# Patient Record
Sex: Male | Born: 1955 | Race: Black or African American | Hispanic: No | Marital: Single
Health system: Midwestern US, Community
[De-identification: ages and names within clinical notes are randomized; demographics above are authoritative.]

## PROBLEM LIST (undated history)

## (undated) DIAGNOSIS — F1020 Alcohol dependence, uncomplicated: Secondary | ICD-10-CM

## (undated) DIAGNOSIS — A4101 Sepsis due to Methicillin susceptible Staphylococcus aureus: Secondary | ICD-10-CM

## (undated) DIAGNOSIS — J189 Pneumonia, unspecified organism: Secondary | ICD-10-CM

## (undated) DIAGNOSIS — E441 Mild protein-calorie malnutrition: Secondary | ICD-10-CM

## (undated) DIAGNOSIS — Z992 Dependence on renal dialysis: Secondary | ICD-10-CM

## (undated) DIAGNOSIS — I471 Supraventricular tachycardia: Secondary | ICD-10-CM

## (undated) DIAGNOSIS — I10 Essential (primary) hypertension: Secondary | ICD-10-CM

## (undated) DIAGNOSIS — N186 End stage renal disease: Secondary | ICD-10-CM

## (undated) DIAGNOSIS — E119 Type 2 diabetes mellitus without complications: Secondary | ICD-10-CM

## (undated) DIAGNOSIS — Z8679 Personal history of other diseases of the circulatory system: Secondary | ICD-10-CM

## (undated) DIAGNOSIS — E876 Hypokalemia: Secondary | ICD-10-CM

## (undated) DIAGNOSIS — T80211A Bloodstream infection due to central venous catheter, initial encounter: Secondary | ICD-10-CM

## (undated) DIAGNOSIS — E1122 Type 2 diabetes mellitus with diabetic chronic kidney disease: Secondary | ICD-10-CM

## (undated) DIAGNOSIS — D649 Anemia, unspecified: Secondary | ICD-10-CM

## (undated) DIAGNOSIS — T82510A Breakdown (mechanical) of surgically created arteriovenous fistula, initial encounter: Secondary | ICD-10-CM

## (undated) DIAGNOSIS — N2581 Secondary hyperparathyroidism of renal origin: Secondary | ICD-10-CM

## (undated) DIAGNOSIS — M4658 Other infective spondylopathies, sacral and sacrococcygeal region: Secondary | ICD-10-CM

## (undated) HISTORY — DX: Secondary hyperparathyroidism of renal origin: N25.81

## (undated) HISTORY — DX: Hypocalcemia: E83.51

## (undated) HISTORY — DX: Hypokalemia: E87.6

## (undated) HISTORY — PX: COLONOSCOPY: SHX174

## (undated) HISTORY — PX: DIALYSIS FISTULA CREATION: SHX611

## (undated) HISTORY — DX: Mild protein-calorie malnutrition: E44.1

## (undated) HISTORY — PX: CHOLECYSTECTOMY: SHX55

---
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
## 2014-09-26 ENCOUNTER — Emergency Department (HOSPITAL_COMMUNITY): Payer: 59

## 2014-09-26 ENCOUNTER — Encounter (HOSPITAL_COMMUNITY): Payer: Self-pay | Admitting: Emergency Medicine

## 2014-09-26 ENCOUNTER — Emergency Department (HOSPITAL_COMMUNITY)
Admission: EM | Admit: 2014-09-26 | Discharge: 2014-09-26 | Disposition: A | Discharge status: Home / Self Care | Payer: 59 | Attending: Emergency Medicine | Admitting: Emergency Medicine

## 2014-09-26 DIAGNOSIS — R410 Disorientation, unspecified: Secondary | ICD-10-CM

## 2014-09-26 DIAGNOSIS — R531 Weakness: Secondary | ICD-10-CM | POA: Insufficient documentation

## 2014-09-26 DIAGNOSIS — I120 Hypertensive chronic kidney disease with stage 5 chronic kidney disease or end stage renal disease: Secondary | ICD-10-CM | POA: Diagnosis not present

## 2014-09-26 DIAGNOSIS — E119 Type 2 diabetes mellitus without complications: Secondary | ICD-10-CM | POA: Insufficient documentation

## 2014-09-26 DIAGNOSIS — Z79899 Other long term (current) drug therapy: Secondary | ICD-10-CM | POA: Diagnosis not present

## 2014-09-26 DIAGNOSIS — R0602 Shortness of breath: Secondary | ICD-10-CM | POA: Insufficient documentation

## 2014-09-26 DIAGNOSIS — F4489 Other dissociative and conversion disorders: Secondary | ICD-10-CM | POA: Insufficient documentation

## 2014-09-26 DIAGNOSIS — N186 End stage renal disease: Secondary | ICD-10-CM | POA: Diagnosis not present

## 2014-09-26 DIAGNOSIS — Z992 Dependence on renal dialysis: Secondary | ICD-10-CM | POA: Diagnosis not present

## 2014-09-26 DIAGNOSIS — R42 Dizziness and giddiness: Secondary | ICD-10-CM | POA: Diagnosis present

## 2014-09-26 DIAGNOSIS — Z794 Long term (current) use of insulin: Secondary | ICD-10-CM | POA: Insufficient documentation

## 2014-09-26 DIAGNOSIS — R5383 Other fatigue: Secondary | ICD-10-CM | POA: Diagnosis not present

## 2014-09-26 HISTORY — DX: Type 2 diabetes mellitus without complications: E11.9

## 2014-09-26 HISTORY — DX: Essential (primary) hypertension: I10

## 2014-09-26 HISTORY — DX: Dependence on renal dialysis: Z99.2

## 2014-09-26 LAB — CBC
HEMATOCRIT: 35.1 % — AB (ref 39.0–52.0)
Hemoglobin: 11.4 g/dL — ABNORMAL LOW (ref 13.0–17.0)
MCH: 27.8 pg (ref 26.0–34.0)
MCHC: 32.5 g/dL (ref 30.0–36.0)
MCV: 85.6 fL (ref 78.0–100.0)
Platelets: 161 10*3/uL (ref 150–400)
RBC: 4.1 MIL/uL — ABNORMAL LOW (ref 4.22–5.81)
RDW: 18.5 % — AB (ref 11.5–15.5)
WBC: 12 10*3/uL — ABNORMAL HIGH (ref 4.0–10.5)

## 2014-09-26 LAB — BASIC METABOLIC PANEL
Anion gap: 21 — ABNORMAL HIGH (ref 5–15)
BUN: 46 mg/dL — AB (ref 6–23)
CALCIUM: 10 mg/dL (ref 8.4–10.5)
CO2: 22 mEq/L (ref 19–32)
CREATININE: 8.28 mg/dL — AB (ref 0.50–1.35)
Chloride: 97 mEq/L (ref 96–112)
GFR, EST AFRICAN AMERICAN: 7 mL/min — AB (ref >90)
GFR, EST NON AFRICAN AMERICAN: 6 mL/min — AB (ref >90)
GLUCOSE: 138 mg/dL — AB (ref 70–99)
Potassium: 5.2 mEq/L (ref 3.7–5.3)
Sodium: 140 mEq/L (ref 137–147)

## 2014-09-26 LAB — CBG MONITORING, ED: GLUCOSE-CAPILLARY: 138 mg/dL — AB (ref 70–99)

## 2014-09-26 LAB — I-STAT TROPONIN, ED: Troponin i, poc: 0.08 ng/mL (ref 0.00–0.08)

## 2014-09-26 LAB — TROPONIN I: Troponin I: <0.3 ng/mL (ref <0.30)

## 2014-09-26 MED ORDER — CLONIDINE HCL 0.2 MG PO TABS
0.2000 mg | ORAL_TABLET | Freq: Once | ORAL | Status: AC
  Administered 2014-09-26: 0.2 mg via ORAL
  Filled 2014-09-26: qty 1

## 2014-09-26 MED ORDER — LISINOPRIL 20 MG PO TABS
40.0000 mg | ORAL_TABLET | Freq: Once | ORAL | Status: AC
  Administered 2014-09-26: 40 mg via ORAL
  Filled 2014-09-26: qty 2

## 2014-09-26 NOTE — ED Notes (Signed)
Patient returned from X-ray 

## 2014-09-26 NOTE — ED Notes (Signed)
MD Jeneen Rinks and Phlebotomy at the bedside.

## 2014-09-26 NOTE — Discharge Instructions (Signed)
Return to the emergency room with any additional symptoms today  Recheck with your regular physician.
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
## 2014-09-26 NOTE — ED Notes (Signed)
Per pt sts dizzy since yesterday. sts he had a blood transfusion yesterday and dialysis. sts he feels light headed. sts some right flank pain.

## 2014-09-28 ENCOUNTER — Inpatient Hospital Stay (HOSPITAL_COMMUNITY)
Admission: EM | Admit: 2014-09-28 | Discharge: 2014-10-06 | DRG: 252 | Disposition: A | Discharge status: Home Health | Payer: 59 | Attending: Internal Medicine | Admitting: Internal Medicine

## 2014-09-28 ENCOUNTER — Encounter (HOSPITAL_COMMUNITY): Payer: Self-pay | Admitting: Emergency Medicine

## 2014-09-28 ENCOUNTER — Emergency Department (HOSPITAL_COMMUNITY): Payer: 59

## 2014-09-28 DIAGNOSIS — Z113 Encounter for screening for infections with a predominantly sexual mode of transmission: Secondary | ICD-10-CM | POA: Insufficient documentation

## 2014-09-28 DIAGNOSIS — Z992 Dependence on renal dialysis: Secondary | ICD-10-CM | POA: Diagnosis not present

## 2014-09-28 DIAGNOSIS — N186 End stage renal disease: Secondary | ICD-10-CM | POA: Diagnosis present

## 2014-09-28 DIAGNOSIS — Z23 Encounter for immunization: Secondary | ICD-10-CM

## 2014-09-28 DIAGNOSIS — R000 Tachycardia, unspecified: Secondary | ICD-10-CM

## 2014-09-28 DIAGNOSIS — M009 Pyogenic arthritis, unspecified: Secondary | ICD-10-CM

## 2014-09-28 DIAGNOSIS — M4806 Spinal stenosis, lumbar region: Secondary | ICD-10-CM | POA: Diagnosis present

## 2014-09-28 DIAGNOSIS — I120 Hypertensive chronic kidney disease with stage 5 chronic kidney disease or end stage renal disease: Secondary | ICD-10-CM | POA: Diagnosis present

## 2014-09-28 DIAGNOSIS — I480 Paroxysmal atrial fibrillation: Secondary | ICD-10-CM | POA: Diagnosis present

## 2014-09-28 DIAGNOSIS — E1122 Type 2 diabetes mellitus with diabetic chronic kidney disease: Secondary | ICD-10-CM | POA: Diagnosis present

## 2014-09-28 DIAGNOSIS — I4891 Unspecified atrial fibrillation: Secondary | ICD-10-CM | POA: Diagnosis present

## 2014-09-28 DIAGNOSIS — M461 Sacroiliitis, not elsewhere classified: Secondary | ICD-10-CM | POA: Diagnosis present

## 2014-09-28 DIAGNOSIS — D631 Anemia in chronic kidney disease: Secondary | ICD-10-CM | POA: Diagnosis present

## 2014-09-28 DIAGNOSIS — M4658 Other infective spondylopathies, sacral and sacrococcygeal region: Secondary | ICD-10-CM | POA: Insufficient documentation

## 2014-09-28 DIAGNOSIS — B958 Unspecified staphylococcus as the cause of diseases classified elsewhere: Secondary | ICD-10-CM | POA: Diagnosis present

## 2014-09-28 DIAGNOSIS — R410 Disorientation, unspecified: Secondary | ICD-10-CM | POA: Diagnosis present

## 2014-09-28 DIAGNOSIS — M48061 Spinal stenosis, lumbar region without neurogenic claudication: Secondary | ICD-10-CM | POA: Insufficient documentation

## 2014-09-28 DIAGNOSIS — R0602 Shortness of breath: Secondary | ICD-10-CM | POA: Diagnosis present

## 2014-09-28 DIAGNOSIS — R29898 Other symptoms and signs involving the musculoskeletal system: Secondary | ICD-10-CM | POA: Insufficient documentation

## 2014-09-28 DIAGNOSIS — M160 Bilateral primary osteoarthritis of hip: Secondary | ICD-10-CM | POA: Diagnosis present

## 2014-09-28 DIAGNOSIS — E8770 Fluid overload, unspecified: Secondary | ICD-10-CM | POA: Diagnosis present

## 2014-09-28 DIAGNOSIS — I471 Supraventricular tachycardia, unspecified: Secondary | ICD-10-CM

## 2014-09-28 DIAGNOSIS — I10 Essential (primary) hypertension: Secondary | ICD-10-CM | POA: Diagnosis present

## 2014-09-28 DIAGNOSIS — Z8679 Personal history of other diseases of the circulatory system: Secondary | ICD-10-CM

## 2014-09-28 DIAGNOSIS — Z794 Long term (current) use of insulin: Secondary | ICD-10-CM

## 2014-09-28 DIAGNOSIS — A419 Sepsis, unspecified organism: Secondary | ICD-10-CM

## 2014-09-28 DIAGNOSIS — Y848 Other medical procedures as the cause of abnormal reaction of the patient, or of later complication, without mention of misadventure at the time of the procedure: Secondary | ICD-10-CM | POA: Diagnosis present

## 2014-09-28 DIAGNOSIS — A4101 Sepsis due to Methicillin susceptible Staphylococcus aureus: Secondary | ICD-10-CM | POA: Diagnosis present

## 2014-09-28 DIAGNOSIS — J90 Pleural effusion, not elsewhere classified: Secondary | ICD-10-CM

## 2014-09-28 DIAGNOSIS — M25559 Pain in unspecified hip: Secondary | ICD-10-CM

## 2014-09-28 DIAGNOSIS — T827XXA Infection and inflammatory reaction due to other cardiac and vascular devices, implants and grafts, initial encounter: Secondary | ICD-10-CM | POA: Diagnosis present

## 2014-09-28 DIAGNOSIS — T8579XA Infection and inflammatory reaction due to other internal prosthetic devices, implants and grafts, initial encounter: Secondary | ICD-10-CM

## 2014-09-28 DIAGNOSIS — E119 Type 2 diabetes mellitus without complications: Secondary | ICD-10-CM

## 2014-09-28 DIAGNOSIS — B999 Unspecified infectious disease: Secondary | ICD-10-CM

## 2014-09-28 DIAGNOSIS — N189 Chronic kidney disease, unspecified: Secondary | ICD-10-CM

## 2014-09-28 DIAGNOSIS — T82898A Other specified complication of vascular prosthetic devices, implants and grafts, initial encounter: Secondary | ICD-10-CM

## 2014-09-28 DIAGNOSIS — T80211A Bloodstream infection due to central venous catheter, initial encounter: Secondary | ICD-10-CM

## 2014-09-28 HISTORY — DX: Type 2 diabetes mellitus without complications: E11.9

## 2014-09-28 HISTORY — DX: Bloodstream infection due to central venous catheter, initial encounter: T80.211A

## 2014-09-28 HISTORY — DX: End stage renal disease: N18.6

## 2014-09-28 HISTORY — DX: Supraventricular tachycardia: I47.1

## 2014-09-28 HISTORY — DX: Supraventricular tachycardia, unspecified: I47.10

## 2014-09-28 LAB — COMPREHENSIVE METABOLIC PANEL
ALK PHOS: 129 U/L — AB (ref 39–117)
ALT: 24 U/L (ref 0–53)
ANION GAP: 19 — AB (ref 5–15)
AST: 31 U/L (ref 0–37)
Albumin: 3.2 g/dL — ABNORMAL LOW (ref 3.5–5.2)
BUN: 30 mg/dL — AB (ref 6–23)
CALCIUM: 10.1 mg/dL (ref 8.4–10.5)
CO2: 24 mEq/L (ref 19–32)
Chloride: 95 mEq/L — ABNORMAL LOW (ref 96–112)
Creatinine, Ser: 6.36 mg/dL — ABNORMAL HIGH (ref 0.50–1.35)
GFR, EST AFRICAN AMERICAN: 10 mL/min — AB (ref >90)
GFR, EST NON AFRICAN AMERICAN: 9 mL/min — AB (ref >90)
GLUCOSE: 121 mg/dL — AB (ref 70–99)
Potassium: 4.6 mEq/L (ref 3.7–5.3)
Sodium: 138 mEq/L (ref 137–147)
Total Bilirubin: 0.7 mg/dL (ref 0.3–1.2)
Total Protein: 8.3 g/dL (ref 6.0–8.3)

## 2014-09-28 LAB — CBC WITH DIFFERENTIAL/PLATELET
BASOS PCT: 0 % (ref 0–1)
Basophils Absolute: 0 10*3/uL (ref 0.0–0.1)
Eosinophils Absolute: 0 10*3/uL (ref 0.0–0.7)
Eosinophils Relative: 0 % (ref 0–5)
HEMATOCRIT: 34.3 % — AB (ref 39.0–52.0)
HEMOGLOBIN: 11.4 g/dL — AB (ref 13.0–17.0)
Lymphocytes Relative: 5 % — ABNORMAL LOW (ref 12–46)
Lymphs Abs: 0.8 10*3/uL (ref 0.7–4.0)
MCH: 27.7 pg (ref 26.0–34.0)
MCHC: 33.2 g/dL (ref 30.0–36.0)
MCV: 83.5 fL (ref 78.0–100.0)
MONO ABS: 1.5 10*3/uL — AB (ref 0.1–1.0)
Monocytes Relative: 9 % (ref 3–12)
NEUTROS ABS: 14.6 10*3/uL — AB (ref 1.7–7.7)
Neutrophils Relative %: 86 % — ABNORMAL HIGH (ref 43–77)
Platelets: 139 10*3/uL — ABNORMAL LOW (ref 150–400)
RBC: 4.11 MIL/uL — ABNORMAL LOW (ref 4.22–5.81)
RDW: 18.3 % — ABNORMAL HIGH (ref 11.5–15.5)
WBC: 16.9 10*3/uL — ABNORMAL HIGH (ref 4.0–10.5)

## 2014-09-28 LAB — I-STAT CG4 LACTIC ACID, ED: Lactic Acid, Venous: 1.85 mmol/L (ref 0.5–2.2)

## 2014-09-28 LAB — GLUCOSE, CAPILLARY: GLUCOSE-CAPILLARY: 121 mg/dL — AB (ref 70–99)

## 2014-09-28 MED ORDER — LISINOPRIL 40 MG PO TABS
40.0000 mg | ORAL_TABLET | Freq: Every day | ORAL | Status: DC
  Administered 2014-09-28: 40 mg via ORAL
  Filled 2014-09-28 (×2): qty 1

## 2014-09-28 MED ORDER — VITAMIN D (ERGOCALCIFEROL) 1.25 MG (50000 UNIT) PO CAPS
50000.0000 [IU] | ORAL_CAPSULE | ORAL | Status: DC
  Administered 2014-10-04: 50000 [IU] via ORAL
  Filled 2014-09-28: qty 1

## 2014-09-28 MED ORDER — INSULIN ASPART 100 UNIT/ML ~~LOC~~ SOLN
0.0000 [IU] | Freq: Three times a day (TID) | SUBCUTANEOUS | Status: DC
  Administered 2014-09-29: 3 [IU] via SUBCUTANEOUS
  Administered 2014-09-29 – 2014-09-30 (×3): 4 [IU] via SUBCUTANEOUS
  Administered 2014-09-30: 1 [IU] via SUBCUTANEOUS
  Administered 2014-10-01: 3 [IU] via SUBCUTANEOUS
  Administered 2014-10-01 – 2014-10-05 (×2): 4 [IU] via SUBCUTANEOUS

## 2014-09-28 MED ORDER — VANCOMYCIN HCL 10 G IV SOLR
2000.0000 mg | Freq: Once | INTRAVENOUS | Status: AC
  Administered 2014-09-28: 2000 mg via INTRAVENOUS
  Filled 2014-09-28: qty 2000

## 2014-09-28 MED ORDER — ADENOSINE 6 MG/2ML IV SOLN
INTRAVENOUS | Status: AC
  Filled 2014-09-28: qty 4

## 2014-09-28 MED ORDER — TORSEMIDE 20 MG PO TABS
40.0000 mg | ORAL_TABLET | Freq: Every day | ORAL | Status: DC
  Administered 2014-09-28 – 2014-10-01 (×4): 40 mg via ORAL
  Filled 2014-09-28 (×5): qty 2

## 2014-09-28 MED ORDER — INFLUENZA VAC SPLIT QUAD 0.5 ML IM SUSY
0.5000 mL | PREFILLED_SYRINGE | INTRAMUSCULAR | Status: DC
  Filled 2014-09-28: qty 0.5

## 2014-09-28 MED ORDER — METOPROLOL TARTRATE 1 MG/ML IV SOLN
5.0000 mg | Freq: Once | INTRAVENOUS | Status: AC
  Administered 2014-09-28: 5 mg via INTRAVENOUS
  Filled 2014-09-28: qty 5

## 2014-09-28 MED ORDER — HEPARIN SODIUM (PORCINE) 5000 UNIT/ML IJ SOLN
5000.0000 [IU] | Freq: Three times a day (TID) | INTRAMUSCULAR | Status: DC
  Administered 2014-09-28 – 2014-10-02 (×11): 5000 [IU] via SUBCUTANEOUS
  Filled 2014-09-28 (×14): qty 1

## 2014-09-28 MED ORDER — PIPERACILLIN-TAZOBACTAM IN DEX 2-0.25 GM/50ML IV SOLN
2.2500 g | Freq: Three times a day (TID) | INTRAVENOUS | Status: DC
Start: 2014-09-28 — End: 2014-09-30
  Administered 2014-09-28 – 2014-09-30 (×6): 2.25 g via INTRAVENOUS
  Filled 2014-09-28 (×8): qty 50

## 2014-09-28 MED ORDER — ACETAMINOPHEN 650 MG RE SUPP: 650.0000 mg | Freq: Four times a day (QID) | RECTAL | Status: DC | PRN

## 2014-09-28 MED ORDER — GABAPENTIN 100 MG PO CAPS
100.0000 mg | ORAL_CAPSULE | Freq: Three times a day (TID) | ORAL | Status: DC
  Administered 2014-09-28 – 2014-10-06 (×22): 100 mg via ORAL
  Filled 2014-09-28 (×25): qty 1

## 2014-09-28 MED ORDER — INSULIN GLARGINE 100 UNIT/ML ~~LOC~~ SOLN
13.0000 [IU] | Freq: Every day | SUBCUTANEOUS | Status: DC
  Administered 2014-09-30 – 2014-10-04 (×5): 13 [IU] via SUBCUTANEOUS
  Filled 2014-09-28 (×7): qty 0.13

## 2014-09-28 MED ORDER — DOXAZOSIN MESYLATE 4 MG PO TABS
4.0000 mg | ORAL_TABLET | Freq: Every day | ORAL | Status: DC
  Administered 2014-09-28 – 2014-10-01 (×4): 4 mg via ORAL
  Filled 2014-09-28 (×5): qty 1

## 2014-09-28 MED ORDER — LABETALOL HCL 300 MG PO TABS
300.0000 mg | ORAL_TABLET | Freq: Three times a day (TID) | ORAL | Status: DC
  Administered 2014-09-28 – 2014-09-29 (×2): 300 mg via ORAL
  Filled 2014-09-28 (×4): qty 1

## 2014-09-28 MED ORDER — SEVELAMER CARBONATE 800 MG PO TABS
1600.0000 mg | ORAL_TABLET | Freq: Three times a day (TID) | ORAL | Status: DC
  Administered 2014-09-29 – 2014-10-06 (×14): 1600 mg via ORAL
  Filled 2014-09-28 (×25): qty 2

## 2014-09-28 MED ORDER — CLONIDINE HCL 0.3 MG PO TABS
0.3000 mg | ORAL_TABLET | Freq: Three times a day (TID) | ORAL | Status: DC
  Administered 2014-09-28 – 2014-09-29 (×2): 0.3 mg via ORAL
  Filled 2014-09-28 (×5): qty 1

## 2014-09-28 MED ORDER — ACETAMINOPHEN 500 MG PO TABS
1000.0000 mg | ORAL_TABLET | Freq: Once | ORAL | Status: AC
  Administered 2014-09-28: 1000 mg via ORAL
  Filled 2014-09-28: qty 2

## 2014-09-28 MED ORDER — INSULIN ASPART 100 UNIT/ML ~~LOC~~ SOLN: 0.0000 [IU] | Freq: Every day | SUBCUTANEOUS | Status: DC

## 2014-09-28 MED ORDER — ADENOSINE 6 MG/2ML IV SOLN
INTRAVENOUS | Status: AC
  Filled 2014-09-28: qty 2

## 2014-09-28 MED ORDER — SODIUM CHLORIDE 0.9 % IJ SOLN
3.0000 mL | Freq: Two times a day (BID) | INTRAMUSCULAR | Status: DC
  Administered 2014-09-29 – 2014-10-06 (×13): 3 mL via INTRAVENOUS

## 2014-09-28 MED ORDER — ACETAMINOPHEN 325 MG PO TABS: 650.0000 mg | ORAL_TABLET | Freq: Four times a day (QID) | ORAL | Status: DC | PRN

## 2014-09-28 MED ORDER — FAMOTIDINE 20 MG PO TABS
20.0000 mg | ORAL_TABLET | Freq: Every day | ORAL | Status: DC
  Administered 2014-09-29 – 2014-10-06 (×8): 20 mg via ORAL
  Filled 2014-09-28 (×8): qty 1

## 2014-09-28 NOTE — ED Provider Notes (Signed)
TIME SEEN: 6:43 PM  CHIEF COMPLAINT: Fever, tachycardia, possible infection at dialysis catheter site  HPI: Pt is a 58 y.o. M with history of hypertension, diabetes, end-stage renal disease on hemodialysis Tuesday, Thursday and Saturday who recently missed analysis and had dialysis today who presents to emergency department with fever, tachypnea, tachycardia with possible infection with at his dialysis catheter site. Denies any recent headache, neck pain or neck stiffness, cough, vomiting or diarrhea. He has had diffuse body aches. No sick contacts or recent travel.  ROS: See HPI Constitutional:  fever  Eyes: no drainage  ENT: no runny nose   Cardiovascular:  no chest pain  Resp: no SOB  GI: no vomiting GU: no dysuria Integumentary: no rash  Allergy: no hives  Musculoskeletal: no leg swelling  Neurological: no slurred speech ROS otherwise negative  PAST MEDICAL HISTORY/PAST SURGICAL HISTORY:  Past Medical History  Diagnosis Date  . Diabetes mellitus without complication   . Renal disorder   . Dialysis patient   . Hypertension     MEDICATIONS:  Prior to Admission medications   Medication Sig Start Date End Date Taking? Authorizing Provider  cloNIDine (CATAPRES) 0.3 MG tablet Take 0.3 mg by mouth every 8 (eight) hours.    Historical Provider, MD  doxazosin (CARDURA) 4 MG tablet Take 4 mg by mouth at bedtime.    Historical Provider, MD  famotidine (PEPCID) 20 MG tablet Take 20 mg by mouth daily.    Historical Provider, MD  gabapentin (NEURONTIN) 100 MG capsule Take 100 mg by mouth 3 (three) times daily.    Historical Provider, MD  insulin glargine (LANTUS) 100 UNIT/ML injection Inject 13 Units into the skin daily.    Historical Provider, MD  labetalol (NORMODYNE) 300 MG tablet Take 300 mg by mouth 3 (three) times daily.    Historical Provider, MD  lisinopril (PRINIVIL,ZESTRIL) 40 MG tablet Take 40 mg by mouth daily.    Historical Provider, MD  sevelamer carbonate (RENVELA) 800 MG  tablet Take 1,600 mg by mouth 3 (three) times daily with meals.    Historical Provider, MD  torsemide (DEMADEX) 20 MG tablet Take 40 mg by mouth daily.    Historical Provider, MD  Vitamin D, Ergocalciferol, (DRISDOL) 50000 UNITS CAPS capsule Take 50,000 Units by mouth every 7 (seven) days. On Thursday    Historical Provider, MD    ALLERGIES:  No Known Allergies  SOCIAL HISTORY:  History  Substance Use Topics  . Smoking status: Never Smoker   . Smokeless tobacco: Not on file  . Alcohol Use: No    FAMILY HISTORY: History reviewed. No pertinent family history.  EXAM: BP 195/113  Pulse 180  Temp(Src) 98.2 F (36.8 C)  Resp 20  SpO2 97% CONSTITUTIONAL: Alert and oriented and responds appropriately to questions. Well-appearing; well-nourished HEAD: Normocephalic EYES: Conjunctivae clear, PERRL ENT: normal nose; no rhinorrhea; moist mucous membranes; pharynx without lesions noted NECK: Supple, no meningismus, no LAD  CARD: Regular and tachycardic; S1 and S2 appreciated; no murmurs, no clicks, no rubs, no gallops RESP: Normal chest excursion without splinting or tachypnea; breath sounds diminished on the right, slightly diminished at the left lung base, tachypnea, no rhonchi or wheezing; dialysis catheter and right chest wall with surrounding erythema and warmth and tenderness without fluctuance or drainage ABD/GI: Normal bowel sounds; non-distended; soft, non-tender, no rebound, no guarding BACK:  The back appears normal and is non-tender to palpation, there is no CVA tenderness EXT: Normal ROM in all joints; non-tender to palpation;  no edema; normal capillary refill; no cyanosis    SKIN: Normal color for age and race; warm NEURO: Moves all extremities equally; sensation to light touch intact diffusely, cranial nerves II-12 intact PSYCH: The patient's mood and manner are appropriate. Grooming and personal hygiene are appropriate.  MEDICAL DECISION MAKING: Patient here with possible  sepsis. Concern for dialysis catheter infection, bacteremia. Will obtain labs, cultures, chest x-ray, urine. Will give broad-spectrum antibiotics. Will give IV fluids cautiously given he is hypertensive and is an end-stage renal disease, hemodialysis patient and does appear slightly volume overloaded currently. Patient will need admission.  ED PROGRESS: Patient's labs show leukocytosis of 17 with left shift. Lactate normal. Urinalysis pending. Chest x-ray shows right pleural effusion and pulmonary edema. He is still tachypnea but suspect this is secondary to fever. No respiratory distress. I do not feel he needs emergent dialysis given he did receive a full dialysis treatment earlier today. Dr. Alcario Drought with hospital service who agrees to admission to stepdown. We'll also consult nephrology.   8:30 PM  Spoke with Dr. Joelyn Oms with nephrology. They will see the patient in the morning. He recommends leaving his dialysis catheter at this time given it is a tunneled catheter. They will see him tomorrow and talk to interventional radiology or vascular surgery about removing this catheter. He agrees with broad-spectrum antibiotics and admission to medicine to stepdown. Patient and family updated with plan.   9:00 PM  Pt has had intermittent episodes of tachycardia in the 180s. He appears to be an SVT during these episodes. Vagal maneuvers do not help resolve the symptoms but he will break without intervention. Hospitalist aware.    CRITICAL CARE Performed by: Nyra Jabs   Total critical care time: 45 minutes  Critical care time was exclusive of separately billable procedures and treating other patients.  Critical care was necessary to treat or prevent imminent or life-threatening deterioration.  Critical care was time spent personally by me on the following activities: development of treatment plan with patient and/or surrogate as well as nursing, discussions with consultants, evaluation of  patient's response to treatment, examination of patient, obtaining history from patient or surrogate, ordering and performing treatments and interventions, ordering and review of laboratory studies, ordering and review of radiographic studies, pulse oximetry and re-evaluation of patient's condition.     EKG Interpretation  Date/Time:  Friday September 28 2014 18:40:00 EDT Ventricular Rate:  147 PR Interval:  71 QRS Duration: 88 QT Interval:  284 QTC Calculation: 444 R Axis:   52 Text Interpretation:  Sinus or ectopic atrial tachycardia Multiple ventricular premature complexes Anterior infarct, old No significant change since last tracing Confirmed by Lauri Purdum,  DO, Imanni Burdine ([id number]) on 09/28/2014 6:52:55 PM         EKG Interpretation  Date/Time:  Friday September 28 2014 20:21:44 EDT Ventricular Rate:  183 PR Interval:  71 QRS Duration: 90 QT Interval:  303 QTC Calculation: 529 R Axis:   29 Text Interpretation:  Supraventricular tachycardia Anterior infarct, old Confirmed by Nattalie Santiesteban,  DO, Kurt Azimi ([id number]) on 09/28/2014 9:21:50 PM        [place], DO 09/29/14 0117

## 2014-09-28 NOTE — ED Notes (Signed)
Pt sent here from dialysis with tachycardia, hypertension, fever and possible infection at port site. sts finished treated today.

## 2014-09-28 NOTE — ED Notes (Signed)
Attempted report 

## 2014-09-28 NOTE — Progress Notes (Signed)
I agree with the assessment above.   Albertina Parr, PharmD.  Clinical Pharmacist Pager [phone]

## 2014-09-28 NOTE — H&P (Signed)
[facility] History and Physical  Noah Vang [id number] DOB: Dec 19, 1955 DOA: 09/28/2014  Referring physician: EDP PCP: No PCP Per Patient   Chief Complaint: Sepsis   HPI: Noah Vang is a 58 y.o. male sent to the ED from dialysis with tachycardia, fever, chills, hypertension.  Symptoms onset during dialysis today.  There is associated tenderness, swelling, and warmth at his RIJ tunneled catheter which is his dialysis access site.  He states they still finished dialysis today (although he is still fluid overloaded on CXR and hypertensive).  Patient reports his access site which has been in for the past 1 month, became tender, swollen, etc 1 week ago.  In ED patient is frankly septic.  Also having episodes of SVT into the 180s on top of sinus tach in the 150s.  Review of Systems: Systems reviewed.  As above, otherwise negative  Past Medical History  Diagnosis Date  . Diabetes mellitus without complication   . Renal disorder   . Dialysis patient   . Hypertension    History reviewed. No pertinent past surgical history. Social History:  reports that he has never smoked. He does not have any smokeless tobacco history on file. He reports that he does not drink alcohol. His drug history is not on file.  No Known Allergies  History reviewed. No pertinent family history.   Prior to Admission medications   Medication Sig Start Date End Date Taking? Authorizing Provider  cloNIDine (CATAPRES) 0.3 MG tablet Take 0.3 mg by mouth every 8 (eight) hours.   Yes Historical Provider, MD  doxazosin (CARDURA) 4 MG tablet Take 4 mg by mouth at bedtime.   Yes Historical Provider, MD  famotidine (PEPCID) 20 MG tablet Take 20 mg by mouth daily.   Yes Historical Provider, MD  gabapentin (NEURONTIN) 100 MG capsule Take 100 mg by mouth 3 (three) times daily.   Yes Historical Provider, MD  insulin glargine (LANTUS) 100 UNIT/ML injection Inject 13 Units into the skin daily.   Yes Historical  Provider, MD  labetalol (NORMODYNE) 300 MG tablet Take 300 mg by mouth 3 (three) times daily.   Yes Historical Provider, MD  lisinopril (PRINIVIL,ZESTRIL) 40 MG tablet Take 40 mg by mouth daily.   Yes Historical Provider, MD  sevelamer carbonate (RENVELA) 800 MG tablet Take 1,600 mg by mouth 3 (three) times daily with meals.   Yes Historical Provider, MD  torsemide (DEMADEX) 20 MG tablet Take 40 mg by mouth daily.   Yes Historical Provider, MD  Vitamin D, Ergocalciferol, (DRISDOL) 50000 UNITS CAPS capsule Take 50,000 Units by mouth every 7 (seven) days. On Thursday   Yes Historical Provider, MD   Physical Exam: Filed Vitals:   09/28/14 2115  BP: 159/93  Pulse: 132  Temp:   Resp: 24    BP 159/93  Pulse 132  Temp(Src) 100.9 F (38.3 C) (Rectal)  Resp 24  Ht 6' 2.02" (1.88 m)  Wt 99.8 kg (220 lb 0.3 oz)  BMI 28.24 kg/m2  SpO2 93%  General Appearance:    Alert, oriented, toxic and septic, appears stated age  Head:    Normocephalic, atraumatic  Eyes:    PERRL, EOMI, sclera non-icteric        Nose:   Nares without drainage or epistaxis. Mucosa, turbinates normal  Throat:   Moist mucous membranes. Oropharynx without erythema or exudate.  Neck:   Supple. Edema, tenderness, and warmth at the tunneled RIJ site.  Back:     No CVA tenderness,  no spinal tenderness  Lungs:     B Rhonchi.  Chest wall:    No tenderness to palpitation  Heart:    Regular rate and rhythm without murmurs, gallops, rubs  Abdomen:     Soft, non-tender, nondistended, normal bowel sounds, no organomegaly  Genitalia:    deferred  Rectal:    deferred  Extremities:   No clubbing, cyanosis or edema.  Pulses:   2+ and symmetric all extremities  Skin:   Skin color, texture, turgor normal, no rashes or lesions  Lymph nodes:   Cervical, supraclavicular, and axillary nodes normal  Neurologic:   CNII-XII intact. Normal strength, sensation and reflexes      throughout    Labs on Admission:  Basic Metabolic  Panel:  Recent Labs Lab 09/26/14 1126 09/28/14 1817  NA 140 138  K 5.2 4.6  CL 97 95*  CO2 22 24  GLUCOSE 138* 121*  BUN 46* 30*  CREATININE 8.28* 6.36*  CALCIUM 10.0 10.1   Liver Function Tests:  Recent Labs Lab 09/28/14 1817  AST 31  ALT 24  ALKPHOS 129*  BILITOT 0.7  PROT 8.3  ALBUMIN 3.2*   No results found for this basename: LIPASE, AMYLASE,  in the last 168 hours No results found for this basename: AMMONIA,  in the last 168 hours CBC:  Recent Labs Lab 09/26/14 1126 09/28/14 1817  WBC 12.0* 16.9*  NEUTROABS  --  14.6*  HGB 11.4* 11.4*  HCT 35.1* 34.3*  MCV 85.6 83.5  PLT 161 139*   Cardiac Enzymes:  Recent Labs Lab 09/26/14 1126  TROPONINI <0.30    BNP (last 3 results) No results found for this basename: PROBNP,  in the last 8760 hours CBG:  Recent Labs Lab 09/26/14 1059  GLUCAP 138*    Radiological Exams on Admission: Dg Chest Port 1 View  (if Code Sepsis Called)  09/28/2014   CLINICAL DATA:  Fever.  Tachycardia.  Confusion.  EXAM: PORTABLE CHEST - 1 VIEW  COMPARISON:  09/26/2014  FINDINGS: Moderate cardiomegaly. Hazy bibasilar airspace disease worrisome for edema. Vascular congestion. Small right pleural effusion. No pneumothorax. Stable right internal jugular tunneled dialysis catheter. Tip is at the cavoatrial junction.  IMPRESSION: Volume overload with basilar hazy edema and right pleural effusion.   Electronically Signed   By: Maryclare Bean M.D.   On: 09/28/2014 18:59    EKG: Independently reviewed.  Assessment/Plan Principal Problem:   CRBSI (catheter-related bloodstream infection) Active Problems:   Sepsis   Line sepsis   ESRD (end stage renal disease) on dialysis   DM (diabetes mellitus)   HTN (hypertension)   SVT (supraventricular tachycardia)   1. Sepsis - most likely source is his RIJ tunneled dialysis catheter 1. BCx pending 2. Zosyn and vanc empirically 3. Got IVF in ED, but clinically patient is actually fluid  overloaded right now with BPs in the 150s (were 190s on arrival), and pulmonary edema on CXR.  Therefore will hold off on additional fluid for now and actually even continue his home torsemide. 4. EDP spoke with nephrology who will see patient in AM, they say to call IR in AM (have put in consult into computer system) to likely pull the infected catheter and put in an alternative site of dialysis access. 1. If patient were to make a full turn around and improvement by morning then might consider trying to salvage catheter site but honestly I doubt that this will happen at this point given his vitals, especially tachycardia,  currently in the ED. 2. SVT - episodes of SVT into the 180s on a background of tachycardia due to sepsis.  The base tachycardia due to sepsis has slightly improved since arrival (was 150 on arrival, now down to 130).  SVT episode didn't last long enough for EDP to give adenosine. 1. Tele monitor 2. Currently S.Tach at 130 due to sepsis. 3. HTN - continue home meds 4. ESRD - nephrology consulted and coming to see him in AM.  Suspect he will require dialysis as he is already fluid overloaded according to CXR. 5. DM - continue home lantus, CBG checks AC/HS    Code Status: Full Code  Family Communication: Family at bedside Disposition Plan: Admit to SDU  Time spent: 70 min  Ader Fritze M. [facility] Pager [phone]  If 7AM-7PM, please contact the day team taking care of the patient [email or web address] Password [email or web address] 09/28/2014, 9:52 PM

## 2014-09-28 NOTE — ED Notes (Signed)
Pt placed on monitor & zoll, attempting IV access

## 2014-09-28 NOTE — ED Notes (Signed)
Ward, MD at bedside

## 2014-09-28 NOTE — ED Notes (Signed)
Pt with slight swelling around port in right chest. No redness observed, port intact, dressing is clean, dry and intact.

## 2014-09-28 NOTE — ED Notes (Signed)
Pt's heart rate has increased to 184. Alcario Drought DO and Ward, MD aware.

## 2014-09-28 NOTE — Progress Notes (Signed)
ANTIBIOTIC CONSULT NOTE - INITIAL  Pharmacy Consult for Vancomycin/Zosyn Indication: sepsis  No Known Allergies  Patient Measurements: Height: 6' 2.02" (188 cm) Weight: 220 lb 0.3 oz (99.8 kg) IBW/kg (Calculated) : 82.24  Vital Signs: Temp: 102.3 F (39.1 C) (10/30 1906) Temp Source: Rectal (10/30 1906) BP: 195/96 mmHg (10/30 1930) Pulse Rate: 141 (10/30 1930)  Labs:  Recent Labs  09/26/14 1126 09/28/14 1817  WBC 12.0* 16.9*  HGB 11.4* 11.4*  PLT 161 139*  CREATININE 8.28* 6.36*   Estimated Creatinine Clearance: 16.4 ml/min (by C-G formula based on Cr of 6.36). No results found for this basename: VANCOTROUGH, VANCOPEAK, VANCORANDOM, GENTTROUGH, GENTPEAK, GENTRANDOM, TOBRATROUGH, TOBRAPEAK, TOBRARND, AMIKACINPEAK, AMIKACINTROU, AMIKACIN,  in the last 72 hours   Microbiology: No results found for this or any previous visit (from the past 720 hour(s)).  Medical History: Past Medical History  Diagnosis Date  . Diabetes mellitus without complication   . Renal disorder   . Dialysis patient   . Hypertension    Assessment: 58 y/o M sent here from dialysis with tachycardia, HTN, fever, and possible infection at port site. HD patient on dialysis TuThSat.  Missed Thursday dialysis and went today and completed dialysis. Temp 102.3, pulse 141, RR 28, BP 195/96, WBC 12.  Empiric antibiotics for sepsis.  Blood cx--> Urine cx-->   Goal of Therapy:  Pre-HD vancomycin level 15-25  Plan:  Zosyn IV 2.25g q8h Vancomycin IV 2000mg  x 1, then Vancomycin IV 1000mg  qHD F/u HD schedule, vancomycin level, cultures  Carl Best 09/28/2014,7:46 PM

## 2014-09-28 NOTE — ED Notes (Signed)
Awaiting abx dosage per pharmacy, medication dosage titrated based on kidney function

## 2014-09-28 NOTE — ED Notes (Signed)
Pt reports being seen at HD, HD completed, pt to come straight to ED per family report was recommendation by HD facility d/t fever, fast HR, & possible HD access infection, pt A&O x4, follows commands, speaks in complete sentences, per family the pt has been intermittently confused since [facility]

## 2014-09-28 NOTE — ED Notes (Signed)
Lab results reported to Dr.Ward.

## 2014-09-29 ENCOUNTER — Inpatient Hospital Stay (HOSPITAL_COMMUNITY): Payer: 59

## 2014-09-29 DIAGNOSIS — Z8679 Personal history of other diseases of the circulatory system: Secondary | ICD-10-CM

## 2014-09-29 DIAGNOSIS — I4891 Unspecified atrial fibrillation: Secondary | ICD-10-CM

## 2014-09-29 DIAGNOSIS — I319 Disease of pericardium, unspecified: Secondary | ICD-10-CM

## 2014-09-29 HISTORY — DX: Personal history of other diseases of the circulatory system: Z86.79

## 2014-09-29 LAB — CBC
HCT: 31.5 % — ABNORMAL LOW (ref 39.0–52.0)
HEMOGLOBIN: 10.2 g/dL — AB (ref 13.0–17.0)
MCH: 26.7 pg (ref 26.0–34.0)
MCHC: 32.4 g/dL (ref 30.0–36.0)
MCV: 82.5 fL (ref 78.0–100.0)
PLATELETS: 144 10*3/uL — AB (ref 150–400)
RBC: 3.82 MIL/uL — AB (ref 4.22–5.81)
RDW: 18.6 % — ABNORMAL HIGH (ref 11.5–15.5)
WBC: 13.9 10*3/uL — ABNORMAL HIGH (ref 4.0–10.5)

## 2014-09-29 LAB — GLUCOSE, CAPILLARY
GLUCOSE-CAPILLARY: 125 mg/dL — AB (ref 70–99)
GLUCOSE-CAPILLARY: 166 mg/dL — AB (ref 70–99)
Glucose-Capillary: 156 mg/dL — ABNORMAL HIGH (ref 70–99)
Glucose-Capillary: 105 mg/dL — ABNORMAL HIGH (ref 70–99)

## 2014-09-29 LAB — TROPONIN I
Troponin I: 0.47 ng/mL (ref <0.30)
Troponin I: 0.33 ng/mL (ref <0.30)
Troponin I: <0.3 ng/mL (ref <0.30)
Troponin I: <0.3 ng/mL (ref <0.30)

## 2014-09-29 LAB — BASIC METABOLIC PANEL
ANION GAP: 18 — AB (ref 5–15)
BUN: 39 mg/dL — ABNORMAL HIGH (ref 6–23)
CALCIUM: 9.1 mg/dL (ref 8.4–10.5)
CO2: 23 meq/L (ref 19–32)
Chloride: 96 mEq/L (ref 96–112)
Creatinine, Ser: 7.34 mg/dL — ABNORMAL HIGH (ref 0.50–1.35)
GFR calc Af Amer: 9 mL/min — ABNORMAL LOW (ref >90)
GFR, EST NON AFRICAN AMERICAN: 7 mL/min — AB (ref >90)
GLUCOSE: 156 mg/dL — AB (ref 70–99)
Potassium: 4.8 mEq/L (ref 3.7–5.3)
Sodium: 137 mEq/L (ref 137–147)

## 2014-09-29 LAB — MRSA PCR SCREENING: MRSA by PCR: POSITIVE — AB

## 2014-09-29 LAB — TSH: TSH: 1.44 u[IU]/mL (ref 0.350–4.500)

## 2014-09-29 MED ORDER — CHLORHEXIDINE GLUCONATE CLOTH 2 % EX PADS
6.0000 | MEDICATED_PAD | Freq: Every day | CUTANEOUS | Status: AC
  Administered 2014-09-29 – 2014-10-03 (×5): 6 via TOPICAL

## 2014-09-29 MED ORDER — LABETALOL HCL 300 MG PO TABS
300.0000 mg | ORAL_TABLET | Freq: Three times a day (TID) | ORAL | Status: DC
  Administered 2014-09-29 (×2): 300 mg via ORAL
  Filled 2014-09-29 (×2): qty 1

## 2014-09-29 MED ORDER — CHLORHEXIDINE GLUCONATE 4 % EX LIQD
CUTANEOUS | Status: AC
  Filled 2014-09-29: qty 15

## 2014-09-29 MED ORDER — OXYCODONE HCL 5 MG PO TABS
5.0000 mg | ORAL_TABLET | Freq: Four times a day (QID) | ORAL | Status: DC | PRN
  Administered 2014-09-29 – 2014-10-02 (×7): 5 mg via ORAL
  Filled 2014-09-29 (×7): qty 1

## 2014-09-29 MED ORDER — MUPIROCIN 2 % EX OINT
1.0000 "application " | TOPICAL_OINTMENT | Freq: Two times a day (BID) | CUTANEOUS | Status: AC
  Administered 2014-09-29 – 2014-10-03 (×10): 1 via NASAL
  Filled 2014-09-29 (×2): qty 22

## 2014-09-29 MED ORDER — DILTIAZEM HCL 100 MG IV SOLR
5.0000 mg/h | INTRAVENOUS | Status: DC
  Filled 2014-09-29: qty 100

## 2014-09-29 MED ORDER — LIDOCAINE HCL 1 % IJ SOLN
INTRAMUSCULAR | Status: AC
  Filled 2014-09-29: qty 20

## 2014-09-29 MED ORDER — SODIUM BICARBONATE 4 % IV SOLN
INTRAVENOUS | Status: AC
  Filled 2014-09-29: qty 5

## 2014-09-29 MED ORDER — METOPROLOL TARTRATE 1 MG/ML IV SOLN
2.5000 mg | Freq: Once | INTRAVENOUS | Status: AC
  Administered 2014-09-29: 2.5 mg via INTRAVENOUS
  Filled 2014-09-29: qty 5

## 2014-09-29 MED ORDER — OXYCODONE HCL 5 MG PO TABS
5.0000 mg | ORAL_TABLET | Freq: Once | ORAL | Status: AC
  Administered 2014-09-29: 5 mg via ORAL
  Filled 2014-09-29: qty 1

## 2014-09-29 MED ORDER — CLONIDINE HCL 0.1 MG PO TABS
0.1000 mg | ORAL_TABLET | Freq: Three times a day (TID) | ORAL | Status: DC
  Administered 2014-09-29 – 2014-09-30 (×2): 0.1 mg via ORAL
  Filled 2014-09-29 (×6): qty 1

## 2014-09-29 MED ORDER — ASPIRIN 325 MG PO TABS
325.0000 mg | ORAL_TABLET | Freq: Every day | ORAL | Status: DC
  Administered 2014-09-29 – 2014-10-06 (×8): 325 mg via ORAL
  Filled 2014-09-29 (×8): qty 1

## 2014-09-29 MED ORDER — LABETALOL HCL 100 MG PO TABS
100.0000 mg | ORAL_TABLET | Freq: Three times a day (TID) | ORAL | Status: DC
  Filled 2014-09-29 (×3): qty 1

## 2014-09-29 NOTE — Procedures (Signed)
Procedure:  Tunneled catheter removal Findings:  Tunneled right chest HD catheter removed in entirety after dissection around subcutaneous cuff.   No complications.

## 2014-09-29 NOTE — Progress Notes (Signed)
CRITICAL VALUE ALERT  Critical value received:  Postive Blood Cultures: Aerobic and Anaerobic Gram + cocci and clusters   Date of notification:  09/29/2014  Time of notification:  [id number]  Critical value read back:Yes.    Nurse who received alert:  Isidor Holts RN  MD notified (1st page):  Dr.Jessica  Eliseo Squires  Time of first page:  [phone]  MD notified (2nd page):  Time of second page:  Responding MD:    Time MD responded:

## 2014-09-29 NOTE — Consult Note (Signed)
Noah Vang 09/29/2014 Rexene Agent Requesting Physician:  Pryor Curia  Reason for Consult:  Fever, Sepsis, ESRD Management HPI:  47M ESRD MWF at [facility] via [facility]  presented to the emergency room yesterday evening with fever, tachycardia.  He was placed on vancomycin and Zosyn. He defervesced. He also had runs of SVT.  Patient started hemodialysis approximately one month ago.  He does not have a fistula or graft. He started having tenderness at the catheter tunneled site 3 days ago accompanied by malaise, myalgias.  He presented to the [facility] ED on 09/26/2014 with confusion and shortness of breath. He had a max temperature of 98.4F at that time. On hemodialysis yesterday he developed fever and looked unwell. He presented to the ER afterwards.  The cause of his renal failure is not known to patient but it appears potentially related to diabetes and hypertension..  Currently he feels unwell with weakness, diffuse myalgias. He denies any rashes. He denies any focal back pain. No pronounced weakness in the legs or loss of sensation. No changes in vision.   Filed Weights   09/28/14 1830  Weight: 99.8 kg (220 lb 0.3 oz)     ROS Balance of 12 systems is negative w/ exceptions as above  Outpt HD Orders  Unit: [place] Days: MWF Other records not currently available, requested  PMH  Past Medical History  Diagnosis Date  . Diabetes mellitus without complication   . Renal disorder   . Dialysis patient   . Hypertension    PSH History reviewed. No pertinent past surgical history. FH History reviewed. No pertinent family history. SH  reports that he has never smoked. He does not have any smokeless tobacco history on file. He reports that he does not drink alcohol. His drug history is not on file. Allergies No Known Allergies Home medications Prior to Admission medications   Medication Sig Start Date End Date Taking? Authorizing Provider  cloNIDine (CATAPRES) 0.3 MG  tablet Take 0.3 mg by mouth every 8 (eight) hours.   Yes Historical Provider, MD  doxazosin (CARDURA) 4 MG tablet Take 4 mg by mouth at bedtime.   Yes Historical Provider, MD  famotidine (PEPCID) 20 MG tablet Take 20 mg by mouth daily.   Yes Historical Provider, MD  gabapentin (NEURONTIN) 100 MG capsule Take 100 mg by mouth 3 (three) times daily.   Yes Historical Provider, MD  insulin glargine (LANTUS) 100 UNIT/ML injection Inject 13 Units into the skin daily.   Yes Historical Provider, MD  labetalol (NORMODYNE) 300 MG tablet Take 300 mg by mouth 3 (three) times daily.   Yes Historical Provider, MD  lisinopril (PRINIVIL,ZESTRIL) 40 MG tablet Take 40 mg by mouth daily.   Yes Historical Provider, MD  sevelamer carbonate (RENVELA) 800 MG tablet Take 1,600 mg by mouth 3 (three) times daily with meals.   Yes Historical Provider, MD  torsemide (DEMADEX) 20 MG tablet Take 40 mg by mouth daily.   Yes Historical Provider, MD  Vitamin D, Ergocalciferol, (DRISDOL) 50000 UNITS CAPS capsule Take 50,000 Units by mouth every 7 (seven) days. On Thursday   Yes Historical Provider, MD    Current Medications Scheduled Meds: . Chlorhexidine Gluconate Cloth  6 each Topical Q0600  . cloNIDine  0.3 mg Oral 3 times per day  . doxazosin  4 mg Oral QHS  . famotidine  20 mg Oral Daily  . gabapentin  100 mg Oral TID  . heparin  5,000 Units Subcutaneous 3  times per day  . Influenza vac split quadrivalent PF  0.5 mL Intramuscular Tomorrow-1000  . insulin aspart  0-20 Units Subcutaneous TID WC  . insulin aspart  0-5 Units Subcutaneous QHS  . insulin glargine  13 Units Subcutaneous Daily  . labetalol  300 mg Oral TID  . lisinopril  40 mg Oral Daily  . mupirocin ointment  1 application Nasal BID  . piperacillin-tazobactam (ZOSYN)  IV  2.25 g Intravenous Q8H  . sevelamer carbonate  1,600 mg Oral TID WC  . sodium chloride  3 mL Intravenous Q12H  . torsemide  40 mg Oral Daily  . [START ON 10/04/2014] Vitamin D  (Ergocalciferol)  50,000 Units Oral Q7 days   Continuous Infusions:  PRN Meds:.acetaminophen, acetaminophen  CBC  Recent Labs Lab 09/26/14 1126 09/28/14 1817 09/29/14 0326  WBC 12.0* 16.9* 13.9*  NEUTROABS  --  14.6*  --   HGB 11.4* 11.4* 10.2*  HCT 35.1* 34.3* 31.5*  MCV 85.6 83.5 82.5  PLT 161 139* [id number]*   Basic Metabolic Panel  Recent Labs Lab 09/26/14 1126 09/28/14 1817 09/29/14 0326  NA 140 138 137  K 5.2 4.6 4.8  CL 97 95* 96  CO2 22 24 23   GLUCOSE 138* 121* 156*  BUN 46* 30* 39*  CREATININE 8.28* 6.36* 7.34*  CALCIUM 10.0 10.1 9.1    Physical Exam  Blood pressure 114/55, pulse 118, temperature 99.7 F (37.6 C), temperature source Rectal, resp. rate 18, height 6' 2.02" (1.88 m), weight 99.8 kg (220 lb 0.3 oz), SpO2 97.00%. GEN: NAD, appears fatigued ENT: NCAT, EYES: glasses onn CV: tachy, regular, no murmur or rub appreciated PULM: Clear on anterior auscultation ABD: Soft nontender, obese SKIN: No rashes or lesions appreciated. Tunneled dialysis catheter without frank purulence at exit site but with tenderness and edema, along the tract EXT: No lower extremity edema NEURO: grossly nonfocal   A/P 1. Sepsis with evidence of exit site infection of dialysis catheter and presumed bacteremia; Leukocytosis, neutrophilia: 1. Vanc/Zosyn started 09/28/14 2. B Cx x2 09/28/14: pending 3. MRSA PCR, Positive 4. With exit site infection patient will need removal of dialysis catheter today. 5. His labs are stable and his volume status is stable, we do not need to place an HD catheter in today. He will have a line holiday tentatively until Monday, 10/01/2014 2. ESRD: on iHD MWF [place] via [facility] R IJ 1. No indications for dialysis today. 2. As above will have a line holiday 3. Obtain outpatient dialysis records 3. HTN/Vol:  1.  Home meds include clonidine, doxazosin, labetalol, lisinopril, torsemide 2. Patient has been normotensive and hypertensive here 3. His  outpatient medicines have been continued 4. Anemia of CKD 1. Outpatient treatment records unknown at the current time. 2. Hemoglobin upon admission at goal 3. Monitor 4. Will review outpatient records once obtained 5. MBD:  1. 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 Outpatient treatment records unknown at the current time. 3. Calcium at goal. 4. We'll obtain outpatient records and provide doses of VDRA as indicated 6. DM2: per [facility]  [name] 09/29/2014, 6:08 AM

## 2014-09-29 NOTE — Progress Notes (Signed)
  Echocardiogram 2D Echocardiogram has been performed.  Noah Vang FRANCES 09/29/2014, 4:01 PM

## 2014-09-29 NOTE — Progress Notes (Signed)
CRITICAL VALUE ALERT  Critical value received:  Positive Blood Cultures: Aerobic and Anaerobic Gram + cocci and clusters   Date of notification:  09/29/2014 Time of notification:  [id number]  Critical value read back:Yes.    Nurse who received alert:  Isidor Holts RN  MD notified (1st page):  Dr.Jessica Eliseo Squires  Time of first page:  1511  MD notified (2nd page):  Time of second page:  Responding MD:  Dr.Vann  Time MD responded:  [phone]

## 2014-09-29 NOTE — Progress Notes (Signed)
PROGRESS NOTE  Noah Vang [id number] DOB: [birth year] DOA: 09/28/2014 PCP: No PCP Per Patient  Assessment/Plan: Sepsis - most likely source is his RIJ tunneled dialysis catheter  BCx pending Zosyn and vanc empirically nephrology  IR to likely pull the infected catheter and put in an alternative site of dialysis access- place back on Monday  HTN - continue home meds  ESRD - nephrology consulted   DM - continue home lantus, CBG checks AC/HS        Atrial fib - cardizem, if not better, may need cards consult- tsh , echo        Increased troponins- from rapid heart rate, sepsis   Code Status: full Family Communication: patient Disposition Plan:    Consultants:  IR  nephrology  Procedures:      HPI/Subjective: No SOB, no CP  Objective: Filed Vitals:   09/29/14 0521  BP: 114/55  Pulse:   Temp:   Resp:     Intake/Output Summary (Last 24 hours) at 09/29/14 0814 Last data filed at 09/29/14 0521  Gross per 24 hour  Intake    100 ml  Output      0 ml  Net    100 ml   Filed Weights   09/28/14 1830 09/28/14 2200  Weight: 99.8 kg (220 lb 0.3 oz) 114.3 kg (251 lb 15.8 oz)    Exam:   General:  A+Ox3, NAD  Cardiovascular: irr  Respiratory: clear  Abdomen: +BS, soft  Musculoskeletal: min LE edema   Data Reviewed: Basic Metabolic Panel:  Recent Labs Lab 09/26/14 1126 09/28/14 1817 09/29/14 0326  NA 140 138 137  K 5.2 4.6 4.8  CL 97 95* 96  CO2 22 24 23   GLUCOSE 138* 121* 156*  BUN 46* 30* 39*  CREATININE 8.28* 6.36* 7.34*  CALCIUM 10.0 10.1 9.1   Liver Function Tests:  Recent Labs Lab 09/28/14 1817  AST 31  ALT 24  ALKPHOS 129*  BILITOT 0.7  PROT 8.3  ALBUMIN 3.2*   No results found for this basename: LIPASE, AMYLASE,  in the last 168 hours No results found for this basename: AMMONIA,  in the last 168 hours CBC:  Recent Labs Lab 09/26/14 1126 09/28/14 1817 09/29/14 0326  WBC 12.0* 16.9* 13.9*  NEUTROABS  --   14.6*  --   HGB 11.4* 11.4* 10.2*  HCT 35.1* 34.3* 31.5*  MCV 85.6 83.5 82.5  PLT 161 139* 144*   Cardiac Enzymes:  Recent Labs Lab 09/26/14 1126 09/29/14 0326  TROPONINI <0.30 0.47*   BNP (last 3 results) No results found for this basename: PROBNP,  in the last 8760 hours CBG:  Recent Labs Lab 09/26/14 1059 09/28/14 2243  GLUCAP 138* 121*    Recent Results (from the past 240 hour(s))  MRSA PCR SCREENING     Status: Abnormal   Collection Time    09/28/14 10:03 PM      Result Value Ref Range Status   MRSA by PCR POSITIVE (*) NEGATIVE Final   Comment:            The GeneXpert MRSA Assay (FDA     approved for NASAL specimens     only), is one component of a     comprehensive MRSA colonization     surveillance program. It is not     intended to diagnose MRSA     infection nor to guide or     monitor treatment for     MRSA infections.  RESULT CALLED TO, READ BACK BY AND VERIFIED WITH:     CALLED TO RN Erline Levine [phone] @0243  THANEY     Studies: Dg Chest Port 1 View  (if Code Sepsis Called)  09/28/2014   CLINICAL DATA:  Fever.  Tachycardia.  Confusion.  EXAM: PORTABLE CHEST - 1 VIEW  COMPARISON:  09/26/2014  FINDINGS: Moderate cardiomegaly. Hazy bibasilar airspace disease worrisome for edema. Vascular congestion. Small right pleural effusion. No pneumothorax. Stable right internal jugular tunneled dialysis catheter. Tip is at the cavoatrial junction.  IMPRESSION: Volume overload with basilar hazy edema and right pleural effusion.   Electronically Signed   By: Maryclare Bean M.D.   On: 09/28/2014 18:59    Scheduled Meds: . Chlorhexidine Gluconate Cloth  6 each Topical Q0600  . cloNIDine  0.3 mg Oral 3 times per day  . doxazosin  4 mg Oral QHS  . famotidine  20 mg Oral Daily  . gabapentin  100 mg Oral TID  . heparin  5,000 Units Subcutaneous 3 times per day  . Influenza vac split quadrivalent PF  0.5 mL Intramuscular Tomorrow-1000  . insulin aspart  0-20  Units Subcutaneous TID WC  . insulin aspart  0-5 Units Subcutaneous QHS  . insulin glargine  13 Units Subcutaneous Daily  . labetalol  300 mg Oral TID  . lisinopril  40 mg Oral Daily  . mupirocin ointment  1 application Nasal BID  . piperacillin-tazobactam (ZOSYN)  IV  2.25 g Intravenous Q8H  . sevelamer carbonate  1,600 mg Oral TID WC  . sodium chloride  3 mL Intravenous Q12H  . torsemide  40 mg Oral Daily  . [START ON 10/04/2014] Vitamin D (Ergocalciferol)  50,000 Units Oral Q7 days   Continuous Infusions:  Antibiotics Given (last 72 hours)   None      Principal Problem:   CRBSI (catheter-related bloodstream infection) Active Problems:   Sepsis   Line sepsis   ESRD (end stage renal disease) on dialysis   DM (diabetes mellitus)   HTN (hypertension)   SVT (supraventricular tachycardia)    Time spent: 35 min    Levie Owensby  [facility] Pager [phone]. If 7PM-7AM, please contact night-coverage at [email or web address], password [facility] 09/29/2014, 8:14 AM  LOS: 1 day

## 2014-09-29 NOTE — Progress Notes (Signed)
At 0120 pt HR in 140's sustaining for several minutes. Pt asymptomatic. Walden Field, NP notified and ordered to have troponin drawn and give Metoprolol 2.5mg  IV.  EKG done showing A fib RVR. Troponin level now 0.47. Walden Field, NP paged at [place] and [phone]. Pt HR back in 140's. No new orders given at this time. Will continue to monitor and pass on to Day Shift RN.

## 2014-09-29 NOTE — Progress Notes (Signed)
CRITICAL VALUE ALERT  Critical value received:  Troponin: 0.47  Date of notification:  09/28/2014  Time of notification:  0500  Critical value read back:Yes.    Nurse who received alert:  Annice Pih  MD notified (1st page):  Paged [facility] Donnal Debar, NP  Time of first page:  0502  MD notified (2nd page):  Time of second page:  Responding MD:   Time MD responded:

## 2014-09-30 ENCOUNTER — Inpatient Hospital Stay (HOSPITAL_COMMUNITY): Payer: 59

## 2014-09-30 DIAGNOSIS — B9561 Methicillin susceptible Staphylococcus aureus infection as the cause of diseases classified elsewhere: Secondary | ICD-10-CM

## 2014-09-30 DIAGNOSIS — R7881 Bacteremia: Secondary | ICD-10-CM

## 2014-09-30 DIAGNOSIS — T80211D Bloodstream infection due to central venous catheter, subsequent encounter: Secondary | ICD-10-CM

## 2014-09-30 DIAGNOSIS — T827XXS Infection and inflammatory reaction due to other cardiac and vascular devices, implants and grafts, sequela: Secondary | ICD-10-CM

## 2014-09-30 LAB — BASIC METABOLIC PANEL
ANION GAP: 18 — AB (ref 5–15)
BUN: 54 mg/dL — ABNORMAL HIGH (ref 6–23)
CHLORIDE: 95 meq/L — AB (ref 96–112)
CO2: 21 mEq/L (ref 19–32)
Calcium: 8.9 mg/dL (ref 8.4–10.5)
Creatinine, Ser: 8.99 mg/dL — ABNORMAL HIGH (ref 0.50–1.35)
GFR calc Af Amer: 7 mL/min — ABNORMAL LOW (ref >90)
GFR calc non Af Amer: 6 mL/min — ABNORMAL LOW (ref >90)
Glucose, Bld: 159 mg/dL — ABNORMAL HIGH (ref 70–99)
Potassium: 4.4 mEq/L (ref 3.7–5.3)
SODIUM: 134 meq/L — AB (ref 137–147)

## 2014-09-30 LAB — TROPONIN I
Troponin I: <0.3 ng/mL (ref <0.30)
Troponin I: <0.3 ng/mL (ref <0.30)
Troponin I: <0.3 ng/mL (ref <0.30)

## 2014-09-30 LAB — CBC
HCT: 29.6 % — ABNORMAL LOW (ref 39.0–52.0)
Hemoglobin: 9.7 g/dL — ABNORMAL LOW (ref 13.0–17.0)
MCH: 26.5 pg (ref 26.0–34.0)
MCHC: 32.8 g/dL (ref 30.0–36.0)
MCV: 80.9 fL (ref 78.0–100.0)
PLATELETS: 139 10*3/uL — AB (ref 150–400)
RBC: 3.66 MIL/uL — ABNORMAL LOW (ref 4.22–5.81)
RDW: 18.2 % — ABNORMAL HIGH (ref 11.5–15.5)
WBC: 12 10*3/uL — AB (ref 4.0–10.5)

## 2014-09-30 LAB — GLUCOSE, CAPILLARY
GLUCOSE-CAPILLARY: 129 mg/dL — AB (ref 70–99)
GLUCOSE-CAPILLARY: 151 mg/dL — AB (ref 70–99)
Glucose-Capillary: 169 mg/dL — ABNORMAL HIGH (ref 70–99)
Glucose-Capillary: 143 mg/dL — ABNORMAL HIGH (ref 70–99)

## 2014-09-30 MED ORDER — METOPROLOL TARTRATE 12.5 MG HALF TABLET
12.5000 mg | ORAL_TABLET | Freq: Two times a day (BID) | ORAL | Status: DC
  Administered 2014-09-30: 12.5 mg via ORAL
  Filled 2014-09-30 (×2): qty 1

## 2014-09-30 MED ORDER — METOPROLOL TARTRATE 25 MG PO TABS
25.0000 mg | ORAL_TABLET | Freq: Two times a day (BID) | ORAL | Status: DC
  Administered 2014-09-30 – 2014-10-01 (×3): 25 mg via ORAL
  Filled 2014-09-30 (×5): qty 1

## 2014-09-30 MED ORDER — VANCOMYCIN HCL IN DEXTROSE 1-5 GM/200ML-% IV SOLN: 1000.0000 mg | INTRAVENOUS | Status: DC

## 2014-09-30 MED ORDER — CEFAZOLIN SODIUM-DEXTROSE 2-3 GM-% IV SOLR
2.0000 g | Freq: Once | INTRAVENOUS | Status: AC
  Administered 2014-09-30: 2 g via INTRAVENOUS
  Filled 2014-09-30: qty 50

## 2014-09-30 MED ORDER — CEFAZOLIN SODIUM-DEXTROSE 2-3 GM-% IV SOLR: 2.0000 g | INTRAVENOUS | Status: DC

## 2014-09-30 NOTE — Progress Notes (Signed)
Admit: 09/28/2014 LOS: 2  Noah Vang ESRD [place] MWF via [facility] admitted with GPC Bacteremia + Tunnel Site Infection + Sepsis   Subjective:  Has had L leg pain and paresthesias for past 24h Never had before No weakness or numbness  Having AFib/RVR, soft BPs overnight TTE yesterday with LVH, no Valvular vegetations B Cx with 2/2 GPC clusters -- Staph aureus   10/31 0701 - 11/01 0700 In: 223 [P.O.:120; I.V.:3; IV Piggyback:100] Out: -   Filed Weights   09/28/14 1830 09/28/14 2200  Weight: 99.8 kg (220 lb 0.3 oz) 114.3 kg (251 lb 15.8 oz)    Scheduled Meds: . aspirin  325 mg Oral Daily  . Chlorhexidine Gluconate Cloth  6 each Topical Q0600  . doxazosin  4 mg Oral QHS  . famotidine  20 mg Oral Daily  . gabapentin  100 mg Oral TID  . heparin  5,000 Units Subcutaneous 3 times per day  . Influenza vac split quadrivalent PF  0.5 mL Intramuscular Tomorrow-1000  . insulin aspart  0-20 Units Subcutaneous TID WC  . insulin aspart  0-5 Units Subcutaneous QHS  . insulin glargine  13 Units Subcutaneous Daily  . metoprolol tartrate  12.5 mg Oral BID  . mupirocin ointment  1 application Nasal BID  . piperacillin-tazobactam (ZOSYN)  IV  2.25 g Intravenous Q8H  . sevelamer carbonate  1,600 mg Oral TID WC  . sodium chloride  3 mL Intravenous Q12H  . torsemide  40 mg Oral Daily  . [START ON 10/04/2014] Vitamin D (Ergocalciferol)  50,000 Units Oral Q7 days   Continuous Infusions:  PRN Meds:.acetaminophen **OR** acetaminophen, oxyCODONE  Current Labs: reviewed    Physical Exam:  Blood pressure 111/65, pulse 56, temperature 98.2 F (36.8 C), temperature source Axillary, resp. rate 22, height 6\' 2"  (1.88 m), weight 114.3 kg (251 lb 15.8 oz), SpO2 96 %. GEN: NAD, appears fatigued ENT: NCAT, EYES: glasses onn CV: tachy, regular, no murmur or rub appreciated, 2+ radials and DPs b/l PULM: Clear on anterior auscultation ABD: Soft nontender, obese SKIN: No rashes or lesions appreciated. Tunneled  dialysis catheter without frank purulence at exit site but with tenderness and edema, along the tract EXT: No lower extremity edema NEURO: 5/5 in LEs b/l.  No sensory deficits.    Assessment / Plan 1. Sepsis with evidence of exit site infection of dialysis catheter and GPC bacteremia; Leukocytosis, neutrophilia: 1. Vanc/Zosyn started 09/28/14 2. B Cx x2 09/28/14: Staph aureus, Sn pending 3. TTE 10/31 negative for vegetations 4. TDC removed 09/29/14, no new line placed; holiday 5. If labs and volume status permit, would be ideal to wait to 11/3 for [facility] placement to prolong period of time catheter free 2. L leg paresthesias and pain 1. New finding in past 24h, pt denies ever having before 2. Will obtain T/L spine MRI w/o gadolinium today; concer nfor epidural abscess 3. No objective weakness at current time 3. ESRD: on iHD MWF [place] via [facility] R IJ 1. No indications for dialysis today. 2. As above will have a line holiday, tentative 11/3 replacement 3. Obtain outpatient dialysis records 4. HTN/Vol: 1. Home meds include clonidine, doxazosin, labetalol, lisinopril, torsemide 2. BP low overnight, stop clonidine, doxazosin, lisonopril 3. On MTP 12.5 BID for RVR 4. Would not restart doxazosin, clonidine 5. If responsive to torsemide, ok to continue for now 5. Anemia of CKD 1. Outpatient treatment records unknown at the current time. 2. Hemoglobin upon admission at goal 3. Monitor 4. 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 Outpatient treatment records unknown at the current time. 3. Calcium at goal. 4. We'll obtain outpatient records and provide doses of VDRA as indicated 7. DM2: per [facility] 8. Afib with RVR 1. On MTP 12.5 BID  [name] 09/30/2014, 1:09 PM   Recent Labs Lab 09/28/14 1817 09/29/14 0326 09/30/14 0147  NA 138 137 134*  K 4.6 4.8 4.4  CL 95* 96 95*  CO2 24 23 21   GLUCOSE 121* 156* 159*  BUN 30* 39*  54*  CREATININE 6.36* 7.34* 8.99*  CALCIUM 10.1 9.1 8.9    Recent Labs Lab 09/28/14 1817 09/29/14 0326 09/30/14 0147  WBC 16.9* 13.9* 12.0*  NEUTROABS 14.6*  --   --   HGB 11.4* 10.2* 9.7*  HCT 34.3* 31.5* 29.6*  MCV 83.5 82.5 80.9  PLT 139* 144* 139*

## 2014-09-30 NOTE — Progress Notes (Signed)
Pharmacy: Cefazolin  58yom started on vancomycin and zosyn for r/o sepsis. 2/2 Blood cultures now positive for staph aureus. Zosyn will be changed to cefazolin. Usual outpatient HD schedule is MWF but patient currently on a line holiday and tentative replacement planned for 11/3. No indications for dialysis today.  10/30 Vancomycin>> 10/30 Zosyn >> 11/1 11/1 Cefazolin>>  10/30 blood cx>> 2/2 Staph Aureus  Plan: 1) Cefazolin 2g IV x 1  2) Follow up HD schedule to determine further doses (for vancomycin and cefazolin)  Nena Jordan, PharmD, BCPS 09/30/2014, 4:24 PM

## 2014-09-30 NOTE — Consult Note (Signed)
[facility]  Total days of antibiotics 3        Day 3 piptazo         1 dose of vanco               Reason for Consult: staph aureus bacteremia/CRBSI   Referring Physician: vann  Principal Problem:   CRBSI (catheter-related bloodstream infection) Active Problems:   Sepsis   Line sepsis   ESRD (end stage renal disease) on dialysis   DM (diabetes mellitus)   HTN (hypertension)   SVT (supraventricular tachycardia)   Atrial fibrillation    HPI: BENJIMEN KELLEY is a 58 y.o. male with DM, ESRD on HD admitted on 10/30 for tachycardia, fever, chills, hypertension during HD session. He was also noted to have tenderness, swelling, and warmth at his RIJ tunneled catheter which is his dialysis access site.on admit have fevers of 102.3. He was started on vancomycin and piptazo. IR removed his tunneled right chest HD catheter on 10/31. He has been afebrile thereafter. Blood cx x 2 sets positive for staph aureus. Sensitivities pending. He had TTE done on 10/31 that saw no vegetations. MRI of spine is pending since the patient complained of left leg pain, and parasthesias.   Past Medical History  Diagnosis Date  . Diabetes mellitus without complication   . Renal disorder   . Dialysis patient   . Hypertension     Allergies: No Known Allergies   MEDICATIONS: . aspirin  325 mg Oral Daily  . Chlorhexidine Gluconate Cloth  6 each Topical Q0600  . doxazosin  4 mg Oral QHS  . famotidine  20 mg Oral Daily  . gabapentin  100 mg Oral TID  . heparin  5,000 Units Subcutaneous 3 times per day  . Influenza vac split quadrivalent PF  0.5 mL Intramuscular Tomorrow-1000  . insulin aspart  0-20 Units Subcutaneous TID WC  . insulin aspart  0-5 Units Subcutaneous QHS  . insulin glargine  13 Units Subcutaneous Daily  . metoprolol tartrate  12.5 mg Oral BID  . mupirocin ointment  1 application Nasal BID  . piperacillin-tazobactam (ZOSYN)  IV  2.25 g Intravenous Q8H  .  sevelamer carbonate  1,600 mg Oral TID WC  . sodium chloride  3 mL Intravenous Q12H  . torsemide  40 mg Oral Daily  . [START ON 10/04/2014] Vitamin D (Ergocalciferol)  50,000 Units Oral Q7 days    History  Substance Use Topics  . Smoking status: Never Smoker   . Smokeless tobacco: Not on file  . Alcohol Use: No    History reviewed. No pertinent family history.   Review of Systems  Constitutional: positive for fever on admit. chills, diaphoresis, activity change, appetite change, fatigue and unexpected weight change.  HENT: Negative for congestion, sore throat, rhinorrhea, sneezing, trouble swallowing and sinus pressure.  Eyes: Negative for photophobia and visual disturbance.  Respiratory: Negative for cough, chest tightness, shortness of breath, wheezing and stridor.  Cardiovascular: Negative for chest pain, palpitations and leg swelling.  Gastrointestinal: Negative for nausea, vomiting, abdominal pain, diarrhea, constipation, blood in stool, abdominal distention and anal bleeding.  Genitourinary: Negative for dysuria, hematuria, flank pain and difficulty urinating.  Musculoskeletal: Negative for myalgias, back pain, joint swelling, arthralgias and gait problem.  Skin: redness at prior catheter site Neurological: Negative for dizziness, tremors, weakness and light-headedness.  Hematological: Negative for adenopathy. Does not bruise/bleed easily.  Psychiatric/Behavioral: Negative for behavioral problems, confusion, sleep disturbance, dysphoric mood,  decreased concentration and agitation.     OBJECTIVE: Temp:  [97.6 F (36.4 C)-99.2 F (37.3 C)] 97.6 F (36.4 C) (11/01 1316) Pulse Rate:  [40-122] 111 (11/01 1316) Resp:  [13-26] 13 (11/01 1316) BP: (82-132)/(49-96) 132/79 mmHg (11/01 1316) SpO2:  [93 %-98 %] 97 % (11/01 1316)  Unable to do examine since patient getting mri  LABS: Results for orders placed or performed during the hospital encounter of 09/28/14 (from the past  48 hour(s))  CBC WITH DIFFERENTIAL     Status: Abnormal   Collection Time: 09/28/14  6:17 PM  Result Value Ref Range   WBC 16.9 (H) 4.0 - 10.5 K/uL   RBC 4.11 (L) 4.22 - 5.81 MIL/uL   Hemoglobin 11.4 (L) 13.0 - 17.0 g/dL   HCT 34.3 (L) 39.0 - 52.0 %   MCV 83.5 78.0 - 100.0 fL   MCH 27.7 26.0 - 34.0 pg   MCHC 33.2 30.0 - 36.0 g/dL   RDW 18.3 (H) 11.5 - 15.5 %   Platelets 139 (L) 150 - 400 K/uL   Neutrophils Relative % 86 (H) 43 - 77 %   Lymphocytes Relative 5 (L) 12 - 46 %   Monocytes Relative 9 3 - 12 %   Eosinophils Relative 0 0 - 5 %   Basophils Relative 0 0 - 1 %   Neutro Abs 14.6 (H) 1.7 - 7.7 K/uL   Lymphs Abs 0.8 0.7 - 4.0 K/uL   Monocytes Absolute 1.5 (H) 0.1 - 1.0 K/uL   Eosinophils Absolute 0.0 0.0 - 0.7 K/uL   Basophils Absolute 0.0 0.0 - 0.1 K/uL   RBC Morphology FEW SCHISTOCYTES NOTED   Comprehensive metabolic panel     Status: Abnormal   Collection Time: 09/28/14  6:17 PM  Result Value Ref Range   Sodium 138 137 - 147 mEq/L   Potassium 4.6 3.7 - 5.3 mEq/L   Chloride 95 (L) 96 - 112 mEq/L   CO2 24 19 - 32 mEq/L   Glucose, Bld 121 (H) 70 - 99 mg/dL   BUN 30 (H) 6 - 23 mg/dL   Creatinine, Ser 6.36 (H) 0.50 - 1.35 mg/dL   Calcium 10.1 8.4 - 10.5 mg/dL   Total Protein 8.3 6.0 - 8.3 g/dL   Albumin 3.2 (L) 3.5 - 5.2 g/dL   AST 31 0 - 37 U/L   ALT 24 0 - 53 U/L   Alkaline Phosphatase 129 (H) 39 - 117 U/L   Total Bilirubin 0.7 0.3 - 1.2 mg/dL   GFR calc non Af Amer 9 (L) >90 mL/min   GFR calc Af Amer 10 (L) >90 mL/min    Comment: (NOTE) The eGFR has been calculated using the CKD EPI equation. This calculation has not been validated in all clinical situations. eGFR's persistently <90 mL/min signify possible Chronic Kidney Disease.   Anion gap 19 (H) 5 - 15  I-Stat CG4 Lactic Acid, ED     Status: None   Collection Time: 09/28/14  6:36 PM  Result Value Ref Range   Lactic Acid, Venous 1.85 0.5 - 2.2 mmol/L  Culture, blood (routine x 2)     Status: None  (Preliminary result)   Collection Time: 09/28/14  6:48 PM  Result Value Ref Range   Specimen Description BLOOD RIGHT ARM    Special Requests BOTTLES DRAWN AEROBIC AND ANAEROBIC 5CC    Culture  Setup Time      09/28/2014 22:33 Performed at [facility]  STAPHYLOCOCCUS AUREUS Note: Gram Stain Report Called to,Read Back By and Verified With: KAITLYN REID 09/29/14 @ 12:21PM BY RUSCOE A. Performed at [facility]    Report Status PENDING   Culture, blood (routine x 2)     Status: None (Preliminary result)   Collection Time: 09/28/14  7:29 PM  Result Value Ref Range   Specimen Description BLOOD HAND RIGHT    Special Requests BOTTLES DRAWN AEROBIC AND ANAEROBIC 5CC    Culture  Setup Time      09/29/2014 01:20 Performed at [facility] Note: RIFAMPIN AND GENTAMICIN SHOULD NOT BE USED AS SINGLE DRUGS FOR TREATMENT OF STAPH INFECTIONS. Note: Gram Stain Report Called to,Read Back By and Verified With: KAITLYN REID 09/29/14 @ 2:59PM BY RUSCOE A. Performed at [facility]    Report Status PENDING   MRSA PCR Screening     Status: Abnormal   Collection Time: 09/28/14 10:03 PM  Result Value Ref Range   MRSA by PCR POSITIVE (A) NEGATIVE    Comment:        The GeneXpert MRSA Assay (FDA approved for NASAL specimens only), is one component of a comprehensive MRSA colonization surveillance program. It is not intended to diagnose MRSA infection nor to guide or monitor treatment for MRSA infections. RESULT CALLED TO, READ BACK BY AND VERIFIED WITH: CALLED TO RN [facility] EVANGELISTA [id number] @0243  THANEY  Glucose, capillary     Status: Abnormal   Collection Time: 09/28/14 10:43 PM  Result Value Ref Range   Glucose-Capillary 121 (H) 70 - 99 mg/dL   Comment 1 Documented in Chart    Comment 2 Notify RN   CBC     Status: Abnormal   Collection Time: 09/29/14  3:26 AM  Result Value Ref Range   WBC 13.9 (H)  4.0 - 10.5 K/uL   RBC 3.82 (L) 4.22 - 5.81 MIL/uL   Hemoglobin 10.2 (L) 13.0 - 17.0 g/dL   HCT 31.5 (L) 39.0 - 52.0 %   MCV 82.5 78.0 - 100.0 fL   MCH 26.7 26.0 - 34.0 pg   MCHC 32.4 30.0 - 36.0 g/dL   RDW 18.6 (H) 11.5 - 15.5 %   Platelets 144 (L) 150 - 400 K/uL  Basic metabolic panel     Status: Abnormal   Collection Time: 09/29/14  3:26 AM  Result Value Ref Range   Sodium 137 137 - 147 mEq/L   Potassium 4.8 3.7 - 5.3 mEq/L   Chloride 96 96 - 112 mEq/L   CO2 23 19 - 32 mEq/L   Glucose, Bld 156 (H) 70 - 99 mg/dL   BUN 39 (H) 6 - 23 mg/dL   Creatinine, Ser 7.34 (H) 0.50 - 1.35 mg/dL   Calcium 9.1 8.4 - 10.5 mg/dL   GFR calc non Af Amer 7 (L) >90 mL/min   GFR calc Af Amer 9 (L) >90 mL/min    Comment: (NOTE) The eGFR has been calculated using the CKD EPI equation. This calculation has not been validated in all clinical situations. eGFR's persistently <90 mL/min signify possible Chronic Kidney Disease.   Anion gap 18 (H) 5 - 15  Troponin I     Status: Abnormal   Collection Time: 09/29/14  3:26 AM  Result Value Ref Range   Troponin I 0.47 (HH) <0.30 ng/mL    Comment:        Due to the release kinetics of cTnI, a  negative result within the first hours of the onset of symptoms does not rule out myocardial infarction with certainty. If myocardial infarction is still suspected, repeat the test at appropriate intervals. CRITICAL RESULT CALLED TO, READ BACK BY AND VERIFIED WITH: EVANGELISTA M,RN 09/29/14 0500 WAYK  Glucose, capillary     Status: Abnormal   Collection Time: 09/29/14  8:17 AM  Result Value Ref Range   Glucose-Capillary 156 (H) 70 - 99 mg/dL  Troponin I     Status: Abnormal   Collection Time: 09/29/14 11:00 AM  Result Value Ref Range   Troponin I 0.33 (HH) <0.30 ng/mL    Comment:        Due to the release kinetics of cTnI, a negative result within the first hours of the onset of symptoms does not rule out myocardial infarction with certainty. If myocardial  infarction is still suspected, repeat the test at appropriate intervals. CRITICAL VALUE NOTED.  VALUE IS CONSISTENT WITH PREVIOUSLY REPORTED AND CALLED VALUE.  Glucose, capillary     Status: Abnormal   Collection Time: 09/29/14  1:08 PM  Result Value Ref Range   Glucose-Capillary 105 (H) 70 - 99 mg/dL  Troponin I     Status: None   Collection Time: 09/29/14  4:35 PM  Result Value Ref Range   Troponin I <0.30 <0.30 ng/mL    Comment:        Due to the release kinetics of cTnI, a negative result within the first hours of the onset of symptoms does not rule out myocardial infarction with certainty. If myocardial infarction is still suspected, repeat the test at appropriate intervals.  TSH     Status: None   Collection Time: 09/29/14  4:35 PM  Result Value Ref Range   TSH 1.440 0.350 - 4.500 uIU/mL  Glucose, capillary     Status: Abnormal   Collection Time: 09/29/14  5:09 PM  Result Value Ref Range   Glucose-Capillary 125 (H) 70 - 99 mg/dL  Troponin I     Status: None   Collection Time: 09/29/14  7:45 PM  Result Value Ref Range   Troponin I <0.30 <0.30 ng/mL    Comment:        Due to the release kinetics of cTnI, a negative result within the first hours of the onset of symptoms does not rule out myocardial infarction with certainty. If myocardial infarction is still suspected, repeat the test at appropriate intervals.  Glucose, capillary     Status: Abnormal   Collection Time: 09/29/14  9:43 PM  Result Value Ref Range   Glucose-Capillary 166 (H) 70 - 99 mg/dL  Troponin I     Status: None   Collection Time: 09/30/14  1:47 AM  Result Value Ref Range   Troponin I <0.30 <0.30 ng/mL    Comment:        Due to the release kinetics of cTnI, a negative result within the first hours of the onset of symptoms does not rule out myocardial infarction with certainty. If myocardial infarction is still suspected, repeat the test at appropriate intervals.   CBC     Status: Abnormal    Collection Time: 09/30/14  1:47 AM  Result Value Ref Range   WBC 12.0 (H) 4.0 - 10.5 K/uL   RBC 3.66 (L) 4.22 - 5.81 MIL/uL   Hemoglobin 9.7 (L) 13.0 - 17.0 g/dL   HCT 29.6 (L) 39.0 - 52.0 %   MCV 80.9 78.0 - 100.0 fL   MCH 26.5  26.0 - 34.0 pg   MCHC 32.8 30.0 - 36.0 g/dL   RDW 18.2 (H) 11.5 - 15.5 %   Platelets 139 (L) 150 - 400 K/uL  Basic metabolic panel     Status: Abnormal   Collection Time: 09/30/14  1:47 AM  Result Value Ref Range   Sodium 134 (L) 137 - 147 mEq/L   Potassium 4.4 3.7 - 5.3 mEq/L   Chloride 95 (L) 96 - 112 mEq/L   CO2 21 19 - 32 mEq/L   Glucose, Bld 159 (H) 70 - 99 mg/dL   BUN 54 (H) 6 - 23 mg/dL   Creatinine, Ser 8.99 (H) 0.50 - 1.35 mg/dL   Calcium 8.9 8.4 - 10.5 mg/dL   GFR calc non Af Amer 6 (L) >90 mL/min   GFR calc Af Amer 7 (L) >90 mL/min    Comment: (NOTE) The eGFR has been calculated using the CKD EPI equation. This calculation has not been validated in all clinical situations. eGFR's persistently <90 mL/min signify possible Chronic Kidney Disease.    Anion gap 18 (H) 5 - 15  Glucose, capillary     Status: Abnormal   Collection Time: 09/30/14  8:42 AM  Result Value Ref Range   Glucose-Capillary 129 (H) 70 - 99 mg/dL  Troponin I     Status: None   Collection Time: 09/30/14  9:05 AM  Result Value Ref Range   Troponin I <0.30 <0.30 ng/mL    Comment:        Due to the release kinetics of cTnI, a negative result within the first hours of the onset of symptoms does not rule out myocardial infarction with certainty. If myocardial infarction is still suspected, repeat the test at appropriate intervals.   Glucose, capillary     Status: Abnormal   Collection Time: 09/30/14  1:12 PM  Result Value Ref Range   Glucose-Capillary 169 (H) 70 - 99 mg/dL  Troponin I     Status: None   Collection Time: 09/30/14  1:58 PM  Result Value Ref Range   Troponin I <0.30 <0.30 ng/mL    Comment:        Due to the release kinetics of cTnI, a negative result  within the first hours of the onset of symptoms does not rule out myocardial infarction with certainty. If myocardial infarction is still suspected, repeat the test at appropriate intervals.     MICRO: 10/30 blood cx 2 of 2 staph aureus 10/30 mrsa colonized  IMAGING: Ir Removal Tun Cv Cath W/o Fl  09/29/2014   CLINICAL DATA:  Renal failure, bacteremia and tenderness overlying a right chest tunneled dialysis catheter. The patient requires removal of the tunneled catheter due to bacteremia.  EXAM: REMOVAL OF TUNNELED CENTRAL VENOUS CATHETER  PROCEDURE: The right chest dialysis catheter site was prepped with chlorhexidine. A sterile gown and gloves were worn during the procedure. Local anesthesia was provided with 1% lidocaine.  Utilizing sharp and blunt dissection, the subcutaneous cuff of the dialysis catheter was freed. The catheter was then successfully removed in its entirety. A sterile dressing was applied over the catheter exit site.  IMPRESSION: Removal of tunneled dialysis catheter utilizing sharp and blunt dissection. The procedure was uncomplicated.   Electronically Signed   By: Aletta Edouard M.D.   On: 09/29/2014 14:13   Dg Chest Port 1 View  (if Code Sepsis Called)  09/28/2014   CLINICAL DATA:  Fever.  Tachycardia.  Confusion.  EXAM: PORTABLE CHEST - 1 VIEW  COMPARISON:  09/26/2014  FINDINGS: Moderate cardiomegaly. Hazy bibasilar airspace disease worrisome for edema. Vascular congestion. Small right pleural effusion. No pneumothorax. Stable right internal jugular tunneled dialysis catheter. Tip is at the cavoatrial junction.  IMPRESSION: Volume overload with basilar hazy edema and right pleural effusion.   Electronically Signed   By: Maryclare Bean M.D.   On: 09/28/2014 18:59   TTE 10/31:Study Conclusions  - Left ventricle: The cavity size was normal. Wall thickness was increased in a pattern of severe LVH. There was severe concentric hypertrophy. Systolic function was normal. The  estimated ejection fraction was in the range of 60% to 65%. - Mitral valve: Calcified annulus. There was mild regurgitation. - Right atrium: The atrium was mildly dilated. - Pericardium, extracardiac: A small pericardial effusion was identified along the left ventricular free wall. The fluid had no internal echoes.There was no evidence of hemodynamic compromise.  I have reviewed pertinent lab results, and radiology studies as it pertains to his clinical presentation  Assessment/Plan: 58yo M with ESRD with fevers, leukocytosis and erythema at his previous HD catheter site found to have staph aureus bacteremia associated with HD line.  - recommend repeat blood cx x 2 today - will switch piptazo to cefazolin, continue on vancomycin - await sensitivities to determine how to narrow regimen - recommend to get TEE to determine length of course - agree with primary team to get mri of spine to look for possible discitis   Dr Tommy Medal to provide further recs in the morning.       [facility] Staphylococcus aureus bacteremia   Staphylococcus aureus bacteremia (SAB) is associated with a high rate of complications and mortality.  Specific aspects of clinical management are critical to optimizing the outcome of patients with SAB.  Therefore, the [facility] Antimicrobial Management Team [facility]) has initiated an intervention aimed at improving the management of SAB at [facility].  To do so, Infectious Diseases physicians are providing an evidence-based consult for the management of all patients with SAB.     Yes No Comments  Perform follow-up blood cultures (even if the patient is afebrile) to ensure clearance of bacteremia [x]  []  09/30/2014  Remove vascular catheter and obtain follow-up blood cultures after the removal of the catheter [x]  []  09/29/2014  Perform echocardiography to evaluate for endocarditis (transthoracic ECHO is 40-50% sensitive, TEE is > 90%  sensitive) [x]  []  Still need TEE            Investigate for "metastatic" sites of infection []  [x]  Mri of spine pending  Change antibiotic therapy to cefazolin and d/c piptazo [x]  []  Beta-lactam antibiotics are preferred for MSSA due to higher cure rates.   If on Vancomycin, goal trough should be 15 - 20 mcg/mL  Estimated duration of IV antibiotic therapy:  Awaiting further studies to determine duration of therapy []  [x]  Consult case management for probably prolonged outpatient IV antibiotic therapy    [facility] [phone]

## 2014-09-30 NOTE — Progress Notes (Signed)
PROGRESS NOTE  Noah Vang [id number] DOB: 02-02-1956 DOA: 09/28/2014 PCP: No PCP Per Patient  Assessment/Plan: Sepsis - most likely source is his RIJ tunneled dialysis catheter  BCx + 2/2: gram  + cocci Zosyn and vanc empirically nephrology  IR pulled infected catheter  -MRI of spine pending  HTN - slightly hypotensive- d/c home meds and start BB  ESRD - nephrology consulted   DM - continue home lantus, CBG checks AC/HS        Atrial fib - control with BB, added ASA- suspect patient is in due to infection        Increased troponins- from rapid heart rate, sepsis- decreasing   Code Status: full Family Communication: patient/family at bedside Disposition Plan:    Consultants:  IR  nephrology  Procedures:  IR- catheter pulled    HPI/Subjective: Feeling better today No CP Aching all over   Objective: Filed Vitals:   09/30/14 0844  BP: 131/74  Pulse: 108  Temp: 98.2 F (36.8 C)  Resp: 20    Intake/Output Summary (Last 24 hours) at 09/30/14 0906 Last data filed at 09/30/14 0455  Gross per 24 hour  Intake    223 ml  Output      0 ml  Net    223 ml   Filed Weights   09/28/14 1830 09/28/14 2200  Weight: 99.8 kg (220 lb 0.3 oz) 114.3 kg (251 lb 15.8 oz)    Exam:   General:  A+Ox3, NAD  Cardiovascular: irr  Respiratory: clear  Abdomen: +BS, soft  Musculoskeletal: min LE edema   Data Reviewed: Basic Metabolic Panel:  Recent Labs Lab 09/26/14 1126 09/28/14 1817 09/29/14 0326 09/30/14 0147  NA 140 138 137 134*  K 5.2 4.6 4.8 4.4  CL 97 95* 96 95*  CO2 22 24 23 21   GLUCOSE 138* 121* 156* 159*  BUN 46* 30* 39* 54*  CREATININE 8.28* 6.36* 7.34* 8.99*  CALCIUM 10.0 10.1 9.1 8.9   Liver Function Tests:  Recent Labs Lab 09/28/14 1817  AST 31  ALT 24  ALKPHOS 129*  BILITOT 0.7  PROT 8.3  ALBUMIN 3.2*   No results for input(s): LIPASE, AMYLASE in the last 168 hours. No results for input(s): AMMONIA in the last 168  hours. CBC:  Recent Labs Lab 09/26/14 1126 09/28/14 1817 09/29/14 0326 09/30/14 0147  WBC 12.0* 16.9* 13.9* 12.0*  NEUTROABS  --  14.6*  --   --   HGB 11.4* 11.4* 10.2* 9.7*  HCT 35.1* 34.3* 31.5* 29.6*  MCV 85.6 83.5 82.5 80.9  PLT 161 139* 144* 139*   Cardiac Enzymes:  Recent Labs Lab 09/29/14 0326 09/29/14 1100 09/29/14 1635 09/29/14 1945 09/30/14 0147  TROPONINI 0.47* 0.33* <0.30 <0.30 <0.30   BNP (last 3 results) No results for input(s): PROBNP in the last 8760 hours. CBG:  Recent Labs Lab 09/29/14 0817 09/29/14 1308 09/29/14 1709 09/29/14 2143 09/30/14 0842  GLUCAP 156* 105* 125* 166* 129*    Recent Results (from the past 240 hour(s))  Culture, blood (routine x 2)     Status: None (Preliminary result)   Collection Time: 09/28/14  6:48 PM  Result Value Ref Range Status   Specimen Description BLOOD RIGHT ARM  Final   Special Requests BOTTLES DRAWN AEROBIC AND ANAEROBIC 5CC  Final   Culture  Setup Time   Final    09/28/2014 22:33 Performed at [facility]  CLUSTERS Note: Gram Stain Report Called to,Read Back By and Verified With: KAITLYN REID 09/29/14 @ 12:21PM BY RUSCOE A. Performed at [facility]   Report Status PENDING  Incomplete  Culture, blood (routine x 2)     Status: None (Preliminary result)   Collection Time: 09/28/14  7:29 PM  Result Value Ref Range Status   Specimen Description BLOOD HAND RIGHT  Final   Special Requests BOTTLES DRAWN AEROBIC AND ANAEROBIC 5CC  Final   Culture  Setup Time   Final    09/29/2014 01:20 Performed at [facility]   Culture   Final    [facility] CLUSTERS Note: Gram Stain Report Called to,Read Back By and Verified With: KAITLYN REID 09/29/14 @ 2:59PM BY RUSCOE A. Performed at [facility]   Report Status PENDING  Incomplete  MRSA PCR Screening     Status: Abnormal   Collection Time: 09/28/14 10:03 PM  Result Value Ref  Range Status   MRSA by PCR POSITIVE (A) NEGATIVE Final    Comment:        The GeneXpert MRSA Assay (FDA approved for NASAL specimens only), is one component of a comprehensive MRSA colonization surveillance program. It is not intended to diagnose MRSA infection nor to guide or monitor treatment for MRSA infections. RESULT CALLED TO, READ BACK BY AND VERIFIED WITH: CALLED TO RN Sharyn Lull EVANGELISTA [id number] @0243  THANEY     Studies: Ir Removal Tun Cv Cath W/o Fl  09/29/2014   CLINICAL DATA:  Renal failure, bacteremia and tenderness overlying a right chest tunneled dialysis catheter. The patient requires removal of the tunneled catheter due to bacteremia.  EXAM: REMOVAL OF TUNNELED CENTRAL VENOUS CATHETER  PROCEDURE: The right chest dialysis catheter site was prepped with chlorhexidine. A sterile gown and gloves were worn during the procedure. Local anesthesia was provided with 1% lidocaine.  Utilizing sharp and blunt dissection, the subcutaneous cuff of the dialysis catheter was freed. The catheter was then successfully removed in its entirety. A sterile dressing was applied over the catheter exit site.  IMPRESSION: Removal of tunneled dialysis catheter utilizing sharp and blunt dissection. The procedure was uncomplicated.   Electronically Signed   By: Aletta Edouard M.D.   On: 09/29/2014 14:13   Dg Chest Port 1 View  (if Code Sepsis Called)  09/28/2014   CLINICAL DATA:  Fever.  Tachycardia.  Confusion.  EXAM: PORTABLE CHEST - 1 VIEW  COMPARISON:  09/26/2014  FINDINGS: Moderate cardiomegaly. Hazy bibasilar airspace disease worrisome for edema. Vascular congestion. Small right pleural effusion. No pneumothorax. Stable right internal jugular tunneled dialysis catheter. Tip is at the cavoatrial junction.  IMPRESSION: Volume overload with basilar hazy edema and right pleural effusion.   Electronically Signed   By: Maryclare Bean M.D.   On: 09/28/2014 18:59    Scheduled Meds: . aspirin  325 mg Oral  Daily  . Chlorhexidine Gluconate Cloth  6 each Topical Q0600  . doxazosin  4 mg Oral QHS  . famotidine  20 mg Oral Daily  . gabapentin  100 mg Oral TID  . heparin  5,000 Units Subcutaneous 3 times per day  . Influenza vac split quadrivalent PF  0.5 mL Intramuscular Tomorrow-1000  . insulin aspart  0-20 Units Subcutaneous TID WC  . insulin aspart  0-5 Units Subcutaneous QHS  . insulin glargine  13 Units Subcutaneous Daily  . metoprolol tartrate  12.5 mg Oral BID  . mupirocin ointment  1 application Nasal BID  .  piperacillin-tazobactam (ZOSYN)  IV  2.25 g Intravenous Q8H  . sevelamer carbonate  1,600 mg Oral TID WC  . sodium chloride  3 mL Intravenous Q12H  . torsemide  40 mg Oral Daily  . [START ON 10/04/2014] Vitamin D (Ergocalciferol)  50,000 Units Oral Q7 days   Continuous Infusions:  Antibiotics Given (last 72 hours)    None      Principal Problem:   CRBSI (catheter-related bloodstream infection) Active Problems:   Sepsis   Line sepsis   ESRD (end stage renal disease) on dialysis   DM (diabetes mellitus)   HTN (hypertension)   SVT (supraventricular tachycardia)   Atrial fibrillation    Time spent: 35 min    VANN, JESSICA  [facility] Pager [phone]. If 7PM-7AM, please contact night-coverage at [email or web address], password [employer] 09/30/2014, 9:06 AM  LOS: 2 days

## 2014-09-30 NOTE — Progress Notes (Signed)
Pharmacy:  Pharmacy consulted to dose vanc for MRSA in blood cx.  Usual outpatient HD schedule is MWF but patient currently on a line holiday and tentative replacement planned for 11/3. No indications for dialysis today. 10/30 vanc 2 gm given at 2008  Plan: f/u for next HD and dose will be vanc 1 gm after HD  Eudelia Bunch, Pharm.D. [id number] 09/30/2014 9:34 PM

## 2014-09-30 NOTE — Progress Notes (Signed)
Lamar Blinks NP made aware of Blood cultures + for MRSA.

## 2014-10-01 DIAGNOSIS — E119 Type 2 diabetes mellitus without complications: Secondary | ICD-10-CM

## 2014-10-01 DIAGNOSIS — I480 Paroxysmal atrial fibrillation: Secondary | ICD-10-CM

## 2014-10-01 DIAGNOSIS — I471 Supraventricular tachycardia: Secondary | ICD-10-CM

## 2014-10-01 DIAGNOSIS — A4101 Sepsis due to Methicillin susceptible Staphylococcus aureus: Secondary | ICD-10-CM | POA: Insufficient documentation

## 2014-10-01 DIAGNOSIS — Z113 Encounter for screening for infections with a predominantly sexual mode of transmission: Secondary | ICD-10-CM

## 2014-10-01 DIAGNOSIS — E1122 Type 2 diabetes mellitus with diabetic chronic kidney disease: Secondary | ICD-10-CM | POA: Insufficient documentation

## 2014-10-01 DIAGNOSIS — B9562 Methicillin resistant Staphylococcus aureus infection as the cause of diseases classified elsewhere: Secondary | ICD-10-CM

## 2014-10-01 DIAGNOSIS — M549 Dorsalgia, unspecified: Secondary | ICD-10-CM

## 2014-10-01 DIAGNOSIS — M48061 Spinal stenosis, lumbar region without neurogenic claudication: Secondary | ICD-10-CM | POA: Insufficient documentation

## 2014-10-01 LAB — URINALYSIS, ROUTINE W REFLEX MICROSCOPIC
Glucose, UA: NEGATIVE mg/dL
Ketones, ur: 15 mg/dL — AB
NITRITE: NEGATIVE
Protein, ur: >300 mg/dL — AB
SPECIFIC GRAVITY, URINE: 1.024 (ref 1.005–1.030)
Urobilinogen, UA: 0.2 mg/dL (ref 0.0–1.0)
pH: 5.5 (ref 5.0–8.0)

## 2014-10-01 LAB — CULTURE, BLOOD (ROUTINE X 2)

## 2014-10-01 LAB — GLUCOSE, CAPILLARY
GLUCOSE-CAPILLARY: 153 mg/dL — AB (ref 70–99)
Glucose-Capillary: 137 mg/dL — ABNORMAL HIGH (ref 70–99)
Glucose-Capillary: 98 mg/dL (ref 70–99)
Glucose-Capillary: 111 mg/dL — ABNORMAL HIGH (ref 70–99)

## 2014-10-01 LAB — TROPONIN I: Troponin I: <0.3 ng/mL (ref <0.30)

## 2014-10-01 LAB — BASIC METABOLIC PANEL
Anion gap: 19 — ABNORMAL HIGH (ref 5–15)
BUN: 64 mg/dL — ABNORMAL HIGH (ref 6–23)
CALCIUM: 8.9 mg/dL (ref 8.4–10.5)
CO2: 23 mEq/L (ref 19–32)
CREATININE: 10.73 mg/dL — AB (ref 0.50–1.35)
Chloride: 93 mEq/L — ABNORMAL LOW (ref 96–112)
GFR calc Af Amer: 5 mL/min — ABNORMAL LOW (ref >90)
GFR, EST NON AFRICAN AMERICAN: 5 mL/min — AB (ref >90)
GLUCOSE: 129 mg/dL — AB (ref 70–99)
Potassium: 4.6 mEq/L (ref 3.7–5.3)
SODIUM: 135 meq/L — AB (ref 137–147)

## 2014-10-01 LAB — CBC
HCT: 36 % — ABNORMAL LOW (ref 39.0–52.0)
HEMOGLOBIN: 11.4 g/dL — AB (ref 13.0–17.0)
MCH: 25.6 pg — ABNORMAL LOW (ref 26.0–34.0)
MCHC: 31.7 g/dL (ref 30.0–36.0)
MCV: 80.9 fL (ref 78.0–100.0)
Platelets: 190 10*3/uL (ref 150–400)
RBC: 4.45 MIL/uL (ref 4.22–5.81)
RDW: 18.1 % — ABNORMAL HIGH (ref 11.5–15.5)
WBC: 9.6 10*3/uL (ref 4.0–10.5)

## 2014-10-01 LAB — URINE MICROSCOPIC-ADD ON

## 2014-10-01 NOTE — Progress Notes (Signed)
[facility] for Infectious [facility]    Day # 4 vancomycin  1 day ancef  3 days zosyn  Subjective:   Complaining of back pain radiating down his right leg    Antibiotics:  Anti-infectives    Start     Dose/Rate Route Frequency Ordered Stop   10/01/14 1200  vancomycin (VANCOCIN) IVPB 1000 mg/200 mL premix  Status:  Discontinued     1,000 mg200 mL/hr over 60 Minutes Intravenous Every M-W-F (Hemodialysis) 09/30/14 1619 09/30/14 1622   10/01/14 1200  ceFAZolin (ANCEF) IVPB 2 g/50 mL premix  Status:  Discontinued     2 g100 mL/hr over 30 Minutes Intravenous Every M-W-F (Hemodialysis) 09/30/14 1619 09/30/14 1622   09/30/14 1700  ceFAZolin (ANCEF) IVPB 2 g/50 mL premix     2 g100 mL/hr over 30 Minutes Intravenous  Once 09/30/14 1619 09/30/14 1811   09/28/14 2000  piperacillin-tazobactam (ZOSYN) IVPB 2.25 g  Status:  Discontinued     2.25 g100 mL/hr over 30 Minutes Intravenous Every 8 hours 09/28/14 1905 09/30/14 1610   09/28/14 1915  vancomycin (VANCOCIN) 2,000 mg in sodium chloride 0.9 % 500 mL IVPB     2,000 mg250 mL/hr over 120 Minutes Intravenous  Once 09/28/14 1905 09/28/14 2208      Medications: Scheduled Meds: . aspirin  325 mg Oral Daily  . Chlorhexidine Gluconate Cloth  6 each Topical Q0600  . doxazosin  4 mg Oral QHS  . famotidine  20 mg Oral Daily  . gabapentin  100 mg Oral TID  . heparin  5,000 Units Subcutaneous 3 times per day  . Influenza vac split quadrivalent PF  0.5 mL Intramuscular Tomorrow-1000  . insulin aspart  0-20 Units Subcutaneous TID WC  . insulin aspart  0-5 Units Subcutaneous QHS  . insulin glargine  13 Units Subcutaneous Daily  . metoprolol tartrate  25 mg Oral BID  . mupirocin ointment  1 application Nasal BID  . sevelamer carbonate  1,600 mg Oral TID WC  . sodium chloride  3 mL Intravenous Q12H  . torsemide  40 mg Oral Daily  . [START ON 10/04/2014] Vitamin D (Ergocalciferol)  50,000 Units Oral Q7 days   Continuous Infusions:  PRN  Meds:.acetaminophen **OR** acetaminophen, oxyCODONE    Objective: Weight change:   Intake/Output Summary (Last 24 hours) at 10/01/14 1014 Last data filed at 09/30/14 1700  Gross per 24 hour  Intake    450 ml  Output      0 ml  Net    450 ml   Blood pressure 139/70, pulse 95, temperature 98.3 F (36.8 C), temperature source Oral, resp. rate 17, height 6\' 2"  (1.88 m), weight 252 lb 3.3 oz (114.4 kg), SpO2 96 %. Temp:  [97.6 F (36.4 C)-98.3 F (36.8 C)] 98.3 F (36.8 C) (11/02 0741) Pulse Rate:  [93-129] 95 (11/02 0741) Resp:  [11-26] 17 (11/02 0741) BP: (127-153)/(55-102) 139/70 mmHg (11/02 0741) SpO2:  [93 %-97 %] 96 % (11/02 0741) Weight:  [252 lb 3.3 oz (114.4 kg)] 252 lb 3.3 oz (114.4 kg) (11/02 0515)  Physical Exam: General: Alert and awake, oriented x3, not in any acute distress. HEENT: anicteric sclera,  EOMI CVS regular rate, normal r,  no murmur rubs or gallops Chest: clear to auscultation bilaterally, no wheezing, rales or rhonchi Abdomen: soft nontender, nondistended, normal bowel sounds, Extremities: no  clubbing or edema noted bilaterally Skin: no rashes Neuro: nonfocal  CBC: CBC Latest Ref Rng 10/01/2014 09/30/2014 09/29/2014  WBC  4.0 - 10.5 K/uL 9.6 12.0(H) 13.9(H)  Hemoglobin 13.0 - 17.0 g/dL 11.4(L) 9.7(L) 10.2(L)  Hematocrit 39.0 - 52.0 % 36.0(L) 29.6(L) 31.5(L)  Platelets 150 - 400 K/uL 190 139(L) 144(L)     BMET  Recent Labs  09/30/14 0147 10/01/14 0250  NA 134* 135*  K 4.4 4.6  CL 95* 93*  CO2 21 23  GLUCOSE 159* 129*  BUN 54* 64*  CREATININE 8.99* 10.73*  CALCIUM 8.9 8.9     Liver Panel   Recent Labs  09/28/14 1817  PROT 8.3  ALBUMIN 3.2*  AST 31  ALT 24  ALKPHOS 129*  BILITOT 0.7       Sedimentation Rate No results for input(s): ESRSEDRATE in the last 72 hours. C-Reactive Protein No results for input(s): CRP in the last 72 hours.  Micro Results: Recent Results (from the past 720 hour(s))  Culture, blood (routine  x 2)     Status: None   Collection Time: 09/28/14  6:48 PM  Result Value Ref Range Status   Specimen Description BLOOD RIGHT ARM  Final   Special Requests BOTTLES DRAWN AEROBIC AND ANAEROBIC 5CC  Final   Culture  Setup Time   Final    09/28/2014 22:33 Performed at [facility]    Culture   Final    STAPHYLOCOCCUS AUREUS Note: SUSCEPTIBILITIES PERFORMED ON PREVIOUS CULTURE WITHIN THE LAST 5 DAYS. Note: Gram Stain Report Called to,Read Back By and Verified With: KAITLYN REID 09/29/14 @ 12:21PM BY RUSCOE A. Performed at [facility]    Report Status 10/01/2014 FINAL  Final  Culture, blood (routine x 2)     Status: None   Collection Time: 09/28/14  7:29 PM  Result Value Ref Range Status   Specimen Description BLOOD HAND RIGHT  Final   Special Requests BOTTLES DRAWN AEROBIC AND ANAEROBIC 5CC  Final   Culture  Setup Time   Final    09/29/2014 01:20 Performed at [facility]    Culture   Final    METHICILLIN RESISTANT STAPHYLOCOCCUS AUREUS Note: RIFAMPIN AND GENTAMICIN SHOULD NOT BE USED AS SINGLE DRUGS FOR TREATMENT OF STAPH INFECTIONS. CRITICAL RESULT CALLED TO, READ BACK BY AND VERIFIED WITH: RICKY OUSTERMAN 09/30/14 @ 8:17PM BY RUSCOE A. Note: Gram Stain Report Called to,Read Back By and Verified With: KAITLYN REID 09/29/14 @ 2:59PM BY RUSCOE A. Performed at [facility]    Report Status 10/01/2014 FINAL  Final   Organism ID, Bacteria METHICILLIN RESISTANT STAPHYLOCOCCUS AUREUS  Final      Susceptibility   Methicillin resistant staphylococcus aureus - MIC*    CLINDAMYCIN >=8 RESISTANT Resistant     ERYTHROMYCIN >=8 RESISTANT Resistant     GENTAMICIN <=0.5 SENSITIVE Sensitive     LEVOFLOXACIN >=8 RESISTANT Resistant     OXACILLIN >=4 RESISTANT Resistant     PENICILLIN >=0.5 RESISTANT Resistant     RIFAMPIN <=0.5 SENSITIVE Sensitive     TRIMETH/SULFA <=10 SENSITIVE Sensitive     VANCOMYCIN 1 SENSITIVE Sensitive     TETRACYCLINE <=1 SENSITIVE  Sensitive     * METHICILLIN RESISTANT STAPHYLOCOCCUS AUREUS  MRSA PCR Screening     Status: Abnormal   Collection Time: 09/28/14 10:03 PM  Result Value Ref Range Status   MRSA by PCR POSITIVE (A) NEGATIVE Final    Comment:        The GeneXpert MRSA Assay (FDA approved for NASAL specimens only), is one component of a comprehensive MRSA colonization surveillance program. It is not  intended to diagnose MRSA infection nor to guide or monitor treatment for MRSA infections. RESULT CALLED TO, READ BACK BY AND VERIFIED WITH: CALLED TO RN Erline Levine [id number] @0243  THANEY    Studies/Results: Mr Thoracic Spine Wo Contrast  09/30/2014   CLINICAL DATA:  Left leg weakness. Diabetes and hypertension. ESRD on dialysis  EXAM: MRI THORACIC AND LUMBAR SPINE WITHOUT CONTRAST  TECHNIQUE: Multiplanar and multiecho pulse sequences of the thoracic and lumbar spine were obtained without intravenous contrast.  COMPARISON:  None.  FINDINGS: MR THORACIC SPINE FINDINGS  Image quality is limited due to motion and body habitus.  Negative for fracture or mass lesion. No cord compression. No cord lesion is identified. Cord evaluation is limited due to motion.  Negative for disc protrusion or spinal stenosis.  Cellular bone marrow pattern consistent with anemia and dialysis.  MR LUMBAR SPINE FINDINGS  Negative for lumbar fracture or mass lesion. Conus medullaris is normal and terminates at T12-L1.  T12-L1:  Negative  L1-2:  Negative  L2-3: Early facet degeneration. Negative for spinal stenosis or disc protrusion.  L3-4: Mild facet degeneration. Negative for disc protrusion or spinal stenosis. Early disc degeneration.  L4-5: 4 mm anterior slip with disc and facet degeneration. Marked bony overgrowth of the facet joints. This causes moderate to severe spinal stenosis. Subarticular stenosis bilaterally causing impingement of the L5 nerve root bilaterally right greater than left  L5-S1:  Negative  IMPRESSION: MR THORACIC  SPINE IMPRESSION  Limited image quality the thoracic spine.  No acute abnormality  MR LUMBAR SPINE IMPRESSION  Limited image quality in the lumbar spine.  Moderate to severe spinal stenosis at L4-5 with grade 1 anterior slip of L4 and L5. L5 nerve root impingement bilaterally.   Electronically Signed   By: Franchot Gallo M.D.   On: 09/30/2014 16:06   Mr Lumbar Spine Wo Contrast  09/30/2014   CLINICAL DATA:  Left leg weakness. Diabetes and hypertension. ESRD on dialysis  EXAM: MRI THORACIC AND LUMBAR SPINE WITHOUT CONTRAST  TECHNIQUE: Multiplanar and multiecho pulse sequences of the thoracic and lumbar spine were obtained without intravenous contrast.  COMPARISON:  None.  FINDINGS: MR THORACIC SPINE FINDINGS  Image quality is limited due to motion and body habitus.  Negative for fracture or mass lesion. No cord compression. No cord lesion is identified. Cord evaluation is limited due to motion.  Negative for disc protrusion or spinal stenosis.  Cellular bone marrow pattern consistent with anemia and dialysis.  MR LUMBAR SPINE FINDINGS  Negative for lumbar fracture or mass lesion. Conus medullaris is normal and terminates at T12-L1.  T12-L1:  Negative  L1-2:  Negative  L2-3: Early facet degeneration. Negative for spinal stenosis or disc protrusion.  L3-4: Mild facet degeneration. Negative for disc protrusion or spinal stenosis. Early disc degeneration.  L4-5: 4 mm anterior slip with disc and facet degeneration. Marked bony overgrowth of the facet joints. This causes moderate to severe spinal stenosis. Subarticular stenosis bilaterally causing impingement of the L5 nerve root bilaterally right greater than left  L5-S1:  Negative  IMPRESSION: MR THORACIC SPINE IMPRESSION  Limited image quality the thoracic spine.  No acute abnormality  MR LUMBAR SPINE IMPRESSION  Limited image quality in the lumbar spine.  Moderate to severe spinal stenosis at L4-5 with grade 1 anterior slip of L4 and L5. L5 nerve root impingement  bilaterally.   Electronically Signed   By: Franchot Gallo M.D.   On: 09/30/2014 16:06   Ir Removal Tun Cv Cath  W/o Fl  09/29/2014   CLINICAL DATA:  Renal failure, bacteremia and tenderness overlying a right chest tunneled dialysis catheter. The patient requires removal of the tunneled catheter due to bacteremia.  EXAM: REMOVAL OF TUNNELED CENTRAL VENOUS CATHETER  PROCEDURE: The right chest dialysis catheter site was prepped with chlorhexidine. A sterile gown and gloves were worn during the procedure. Local anesthesia was provided with 1% lidocaine.  Utilizing sharp and blunt dissection, the subcutaneous cuff of the dialysis catheter was freed. The catheter was then successfully removed in its entirety. A sterile dressing was applied over the catheter exit site.  IMPRESSION: Removal of tunneled dialysis catheter utilizing sharp and blunt dissection. The procedure was uncomplicated.   Electronically Signed   By: Aletta Edouard M.D.   On: 09/29/2014 14:13      Assessment/Plan:  Principal Problem:   CRBSI (catheter-related bloodstream infection) Active Problems:   Sepsis   Line sepsis   ESRD (end stage renal disease) on dialysis   DM (diabetes mellitus)   HTN (hypertension)   SVT (supraventricular tachycardia)   Atrial fibrillation    Noah Vang is a 58 y.o. male with  MRSA bacteremia, ESRD on HD sp removal of HD line. He has had severe back pain and MRI shows spinal analysis but no clear-cut infection transesophageal echocardiogram is pending  #1      [place] [facility] Team Staphylococcus aureus bacteremia   Staphylococcus aureus bacteremia (SAB) is associated with a high rate of complications and mortality.  Specific aspects of clinical management are critical to optimizing the outcome of patients with SAB.  Therefore, the [facility] Antimicrobial Management Team [facility]) has initiated an intervention aimed at improving the management of SAB at [facility].  To do  so, Infectious Diseases physicians are providing an evidence-based consult for the management of all patients with SAB.     Yes No Comments  Perform follow-up blood cultures (even if the patient is afebrile) to ensure clearance of bacteremia [x]  []  Blood cultures from 09/30/14 cooking  Remove vascular catheter and obtain follow-up blood cultures after the removal of the catheter [x]  []  HD out   Perform echocardiography to evaluate for endocarditis (transthoracic ECHO is 40-50% sensitive, TEE is > 90% sensitive) []  []  Please keep in mind, that neither test can definitively EXCLUDE endocarditis, and that should clinical suspicion remain high for endocarditis the patient should then still be treated with an "endocarditis" duration of therapy = 6 weeks TEE PENDING  Consult electrophysiologist to evaluate implanted cardiac device (pacemaker, ICD) []  []  NA  Ensure source control []  []  Have all abscesses been drained effectively? Have deep seeded infections (septic joints or osteomyelitis) had appropriate surgical debridement?  Investigate for "metastatic" sites of infection []  []  Does the patient have ANY symptom or physical exam finding that would suggest a deeper infection (back or neck pain that may be suggestive of vertebral osteomyelitis or epidural abscess, muscle pain that could be a symptom of pyomyositis)?  Keep in mind that for deep seeded infections MRI imaging with contrast is preferred rather than other often insensitive tests such as plain x-rays, especially early in a patient's presentation.  MRI SPINE WITHOUT INFECTION BUT WOULD CONSIDER REPEATING IN 1 MONTH  Change antibiotic therapy to VANCOMYCIN []  []  Beta-lactam antibiotics are preferred for MSSA due to higher cure rates.   If on Vancomycin, goal trough should be 15 - 20 mcg/mL  Estimated duration of IV antibiotic therapy:  6 WEEKS GIVEN CONCERN FOR SMOLDERING  DISK INFECTION []  []  Consult case management for probably prolonged outpatient  IV antibiotic therapy   #2 Screening: check HIV and Hep virus panel    LOS: 3 days   Rhina Brackett [place] 10/01/2014, 10:14 AM

## 2014-10-01 NOTE — Progress Notes (Signed)
PROGRESS NOTE  Noah Vang [id number] DOB: 1956/08/20 DOA: 09/28/2014 PCP: No PCP Per Patient  Assessment/Plan: Sepsis due to SAB- most likely source is his RIJ tunneled dialysis catheter  BCx + 2/2 -repeated 11/1 Vanc/cefazolin -needs TEE (will plan for after access placed for dialysis) Nephrology consult IR pulled infected catheter 10/31  -MRI of spine shows:  Moderate to severe spinal stenosis at L4-5 with grade 1 anterior slip of L4 and L5. L5 nerve root impingement          bilaterally -- follow up outpatient  HTN - BB  ESRD - nephrology consulted- will need access placed  DM - continue home lantus, CBG checks AC/HS        Paroxysmal Atrial fib - control with BB, added ASA- back in sinus- discussed further anticoagulation with patient- declined for now        Increased troponins- from rapid heart rate, sepsis- decreasing   Code Status: full Family Communication: patient/family at bedside Disposition Plan: tx to tele bed   Consultants:  IR  Nephrology  ID  Procedures:  IR- catheter pulled    HPI/Subjective: Feeling better No SOB, no CP Still with leg numbness   Objective: Filed Vitals:   10/01/14 0741  BP: 139/70  Pulse: 95  Temp: 98.3 F (36.8 C)  Resp: 17    Intake/Output Summary (Last 24 hours) at 10/01/14 0920 Last data filed at 09/30/14 1700  Gross per 24 hour  Intake    450 ml  Output      0 ml  Net    450 ml   Filed Weights   09/28/14 1830 09/28/14 2200 10/01/14 0515  Weight: 99.8 kg (220 lb 0.3 oz) 114.3 kg (251 lb 15.8 oz) 114.4 kg (252 lb 3.3 oz)    Exam:   General:  A+Ox3, NAD  Cardiovascular: rrr  Respiratory: clear  Abdomen: +BS, soft  Musculoskeletal: min LE edema   Data Reviewed: Basic Metabolic Panel:  Recent Labs Lab 09/26/14 1126 09/28/14 1817 09/29/14 0326 09/30/14 0147 10/01/14 0250  NA 140 138 137 134* 135*  K 5.2 4.6 4.8 4.4 4.6  CL 97 95* 96 95* 93*  CO2 22 24 23 21 23   GLUCOSE 138*  121* 156* 159* 129*  BUN 46* 30* 39* 54* 64*  CREATININE 8.28* 6.36* 7.34* 8.99* 10.73*  CALCIUM 10.0 10.1 9.1 8.9 8.9   Liver Function Tests:  Recent Labs Lab 09/28/14 1817  AST 31  ALT 24  ALKPHOS 129*  BILITOT 0.7  PROT 8.3  ALBUMIN 3.2*   No results for input(s): LIPASE, AMYLASE in the last 168 hours. No results for input(s): AMMONIA in the last 168 hours. CBC:  Recent Labs Lab 09/26/14 1126 09/28/14 1817 09/29/14 0326 09/30/14 0147 10/01/14 0250  WBC 12.0* 16.9* 13.9* 12.0* 9.6  NEUTROABS  --  14.6*  --   --   --   HGB 11.4* 11.4* 10.2* 9.7* 11.4*  HCT 35.1* 34.3* 31.5* 29.6* 36.0*  MCV 85.6 83.5 82.5 80.9 80.9  PLT 161 139* 144* 139* 190   Cardiac Enzymes:  Recent Labs Lab 09/30/14 0147 09/30/14 0905 09/30/14 1358 09/30/14 1931 10/01/14 0250  TROPONINI <0.30 <0.30 <0.30 <0.30 <0.30   BNP (last 3 results) No results for input(s): PROBNP in the last 8760 hours. CBG:  Recent Labs Lab 09/30/14 0842 09/30/14 1312 09/30/14 1703 09/30/14 2212 10/01/14 0739  GLUCAP 129* 169* 151* 143* 137*    Recent Results (from the past 240 hour(s))  Culture,  blood (routine x 2)     Status: None   Collection Time: 09/28/14  6:48 PM  Result Value Ref Range Status   Specimen Description BLOOD RIGHT ARM  Final   Special Requests BOTTLES DRAWN AEROBIC AND ANAEROBIC 5CC  Final   Culture  Setup Time   Final    09/28/2014 22:33 Performed at [facility]    Culture   Final    STAPHYLOCOCCUS AUREUS Note: SUSCEPTIBILITIES PERFORMED ON PREVIOUS CULTURE WITHIN THE LAST 5 DAYS. Note: Gram Stain Report Called to,Read Back By and Verified With: KAITLYN REID 09/29/14 @ 12:21PM BY RUSCOE A. Performed at [facility]    Report Status 10/01/2014 FINAL  Final  Culture, blood (routine x 2)     Status: None   Collection Time: 09/28/14  7:29 PM  Result Value Ref Range Status   Specimen Description BLOOD HAND RIGHT  Final   Special Requests BOTTLES DRAWN  AEROBIC AND ANAEROBIC 5CC  Final   Culture  Setup Time   Final    09/29/2014 01:20 Performed at [facility]    Culture   Final    METHICILLIN RESISTANT STAPHYLOCOCCUS AUREUS Note: RIFAMPIN AND GENTAMICIN SHOULD NOT BE USED AS SINGLE DRUGS FOR TREATMENT OF STAPH INFECTIONS. CRITICAL RESULT CALLED TO, READ BACK BY AND VERIFIED WITH: RICKY OUSTERMAN 09/30/14 @ 8:17PM BY RUSCOE A. Note: Gram Stain Report Called to,Read Back By and Verified With: KAITLYN REID 09/29/14 @ 2:59PM BY RUSCOE A. Performed at [facility]    Report Status 10/01/2014 FINAL  Final   Organism ID, Bacteria METHICILLIN RESISTANT STAPHYLOCOCCUS AUREUS  Final      Susceptibility   Methicillin resistant staphylococcus aureus - MIC*    CLINDAMYCIN >=8 RESISTANT Resistant     ERYTHROMYCIN >=8 RESISTANT Resistant     GENTAMICIN <=0.5 SENSITIVE Sensitive     LEVOFLOXACIN >=8 RESISTANT Resistant     OXACILLIN >=4 RESISTANT Resistant     PENICILLIN >=0.5 RESISTANT Resistant     RIFAMPIN <=0.5 SENSITIVE Sensitive     TRIMETH/SULFA <=10 SENSITIVE Sensitive     VANCOMYCIN 1 SENSITIVE Sensitive     TETRACYCLINE <=1 SENSITIVE Sensitive     * METHICILLIN RESISTANT STAPHYLOCOCCUS AUREUS  MRSA PCR Screening     Status: Abnormal   Collection Time: 09/28/14 10:03 PM  Result Value Ref Range Status   MRSA by PCR POSITIVE (A) NEGATIVE Final    Comment:        The GeneXpert MRSA Assay (FDA approved for NASAL specimens only), is one component of a comprehensive MRSA colonization surveillance program. It is not intended to diagnose MRSA infection nor to guide or monitor treatment for MRSA infections. RESULT CALLED TO, READ BACK BY AND VERIFIED WITH: CALLED TO RN Erline Levine [id number] @[phone]  THANEY     Studies: Mr Thoracic Spine Wo Contrast  09/30/2014   CLINICAL DATA:  Left leg weakness. Diabetes and hypertension. ESRD on dialysis  EXAM: MRI THORACIC AND LUMBAR SPINE WITHOUT CONTRAST  TECHNIQUE: Multiplanar  and multiecho pulse sequences of the thoracic and lumbar spine were obtained without intravenous contrast.  COMPARISON:  None.  FINDINGS: MR THORACIC SPINE FINDINGS  Image quality is limited due to motion and body habitus.  Negative for fracture or mass lesion. No cord compression. No cord lesion is identified. Cord evaluation is limited due to motion.  Negative for disc protrusion or spinal stenosis.  Cellular bone marrow pattern consistent with anemia and dialysis.  MR LUMBAR SPINE FINDINGS  Negative  for lumbar fracture or mass lesion. Conus medullaris is normal and terminates at T12-L1.  T12-L1:  Negative  L1-2:  Negative  L2-3: Early facet degeneration. Negative for spinal stenosis or disc protrusion.  L3-4: Mild facet degeneration. Negative for disc protrusion or spinal stenosis. Early disc degeneration.  L4-5: 4 mm anterior slip with disc and facet degeneration. Marked bony overgrowth of the facet joints. This causes moderate to severe spinal stenosis. Subarticular stenosis bilaterally causing impingement of the L5 nerve root bilaterally right greater than left  L5-S1:  Negative  IMPRESSION: MR THORACIC SPINE IMPRESSION  Limited image quality the thoracic spine.  No acute abnormality  MR LUMBAR SPINE IMPRESSION  Limited image quality in the lumbar spine.  Moderate to severe spinal stenosis at L4-5 with grade 1 anterior slip of L4 and L5. L5 nerve root impingement bilaterally.   Electronically Signed   By: Franchot Gallo M.D.   On: 09/30/2014 16:06   Mr Lumbar Spine Wo Contrast  09/30/2014   CLINICAL DATA:  Left leg weakness. Diabetes and hypertension. ESRD on dialysis  EXAM: MRI THORACIC AND LUMBAR SPINE WITHOUT CONTRAST  TECHNIQUE: Multiplanar and multiecho pulse sequences of the thoracic and lumbar spine were obtained without intravenous contrast.  COMPARISON:  None.  FINDINGS: MR THORACIC SPINE FINDINGS  Image quality is limited due to motion and body habitus.  Negative for fracture or mass lesion. No  cord compression. No cord lesion is identified. Cord evaluation is limited due to motion.  Negative for disc protrusion or spinal stenosis.  Cellular bone marrow pattern consistent with anemia and dialysis.  MR LUMBAR SPINE FINDINGS  Negative for lumbar fracture or mass lesion. Conus medullaris is normal and terminates at T12-L1.  T12-L1:  Negative  L1-2:  Negative  L2-3: Early facet degeneration. Negative for spinal stenosis or disc protrusion.  L3-4: Mild facet degeneration. Negative for disc protrusion or spinal stenosis. Early disc degeneration.  L4-5: 4 mm anterior slip with disc and facet degeneration. Marked bony overgrowth of the facet joints. This causes moderate to severe spinal stenosis. Subarticular stenosis bilaterally causing impingement of the L5 nerve root bilaterally right greater than left  L5-S1:  Negative  IMPRESSION: MR THORACIC SPINE IMPRESSION  Limited image quality the thoracic spine.  No acute abnormality  MR LUMBAR SPINE IMPRESSION  Limited image quality in the lumbar spine.  Moderate to severe spinal stenosis at L4-5 with grade 1 anterior slip of L4 and L5. L5 nerve root impingement bilaterally.   Electronically Signed   By: Franchot Gallo M.D.   On: 09/30/2014 16:06   Ir Removal Tun Cv Cath W/o Fl  09/29/2014   CLINICAL DATA:  Renal failure, bacteremia and tenderness overlying a right chest tunneled dialysis catheter. The patient requires removal of the tunneled catheter due to bacteremia.  EXAM: REMOVAL OF TUNNELED CENTRAL VENOUS CATHETER  PROCEDURE: The right chest dialysis catheter site was prepped with chlorhexidine. A sterile gown and gloves were worn during the procedure. Local anesthesia was provided with 1% lidocaine.  Utilizing sharp and blunt dissection, the subcutaneous cuff of the dialysis catheter was freed. The catheter was then successfully removed in its entirety. A sterile dressing was applied over the catheter exit site.  IMPRESSION: Removal of tunneled dialysis  catheter utilizing sharp and blunt dissection. The procedure was uncomplicated.   Electronically Signed   By: Aletta Edouard M.D.   On: 09/29/2014 14:13    Scheduled Meds: . aspirin  325 mg Oral Daily  . Chlorhexidine Gluconate  Cloth  6 each Topical [id number]  . doxazosin  4 mg Oral QHS  . famotidine  20 mg Oral Daily  . gabapentin  100 mg Oral TID  . heparin  5,000 Units Subcutaneous 3 times per day  . Influenza vac split quadrivalent PF  0.5 mL Intramuscular Tomorrow-1000  . insulin aspart  0-20 Units Subcutaneous TID WC  . insulin aspart  0-5 Units Subcutaneous QHS  . insulin glargine  13 Units Subcutaneous Daily  . metoprolol tartrate  25 mg Oral BID  . mupirocin ointment  1 application Nasal BID  . sevelamer carbonate  1,600 mg Oral TID WC  . sodium chloride  3 mL Intravenous Q12H  . torsemide  40 mg Oral Daily  . [START ON 10/04/2014] Vitamin D (Ergocalciferol)  50,000 Units Oral Q7 days   Continuous Infusions:  Antibiotics Given (last 72 hours)    Date/Time Action Medication Dose Rate   09/30/14 1741 Given   ceFAZolin (ANCEF) IVPB 2 g/50 mL premix 2 g 100 mL/hr      Principal Problem:   CRBSI (catheter-related bloodstream infection) Active Problems:   Sepsis   Line sepsis   ESRD (end stage renal disease) on dialysis   DM (diabetes mellitus)   HTN (hypertension)   SVT (supraventricular tachycardia)   Atrial fibrillation    Time spent: 35 min    Charlesa Ehle  [facility] Pager [phone]. If 7PM-7AM, please contact night-coverage at [email or web address], password [facility] 10/01/2014, 9:20 AM  LOS: 3 days

## 2014-10-01 NOTE — Clinical Documentation Improvement (Signed)
Possible Clinical Conditions?  Please clarify the type of atrial fibrillation: ? Chronic ? Paroxysmal ? Permanent ? Persistent ? Other (please specify type)________________________ ? Unable to determine ? Unknown   Risk Factors: A.fib noted per 11/01 progress notes.    Thank You, Theron Arista, Clinical Documentation Specialist:  [phone]  [place] Information Management

## 2014-10-01 NOTE — Progress Notes (Addendum)
   [facility] Progress Note   Subjective: MRI shows bilat L5 root impingement, no abcess or discitis  Filed Vitals:   10/01/14 0100 10/01/14 0200 10/01/14 0515 10/01/14 0741  BP: 152/90 145/85 127/70 139/70  Pulse: 103 95 95 95  Temp:   97.9 F (36.6 C) 98.3 F (36.8 C)  TempSrc:   Oral Oral  Resp: 21 20 19 17   Height:      Weight:   114.4 kg (252 lb 3.3 oz)   SpO2: 96% 96% 93% 96%   Exam: GEN: NAD, appears fatigued CV: tachy, regular, no murmur or rub PULM: Clear on anterior auscultation ABD: Soft nontender, obese EXT: No lower extremity edema NEURO: 5/5 in LEs b/l. No sensory deficits.   HD: MWF [place] [phone]) 4h  F200   262 lb dry wt (119kg)  350/800  Heparin 4800 > 500 u/hr       Assessment: 1 Catheter-related bacteremia / MRSA - IV vanc/zosyn 10/30, TTE neg. Cath removed 10/31  2 Left leg pain/ paresthesias- MRI no evidence of abcess/ discitis, prob DJD/disc disease  3 ESRD on HD, last HD Friday 10/20 4 HTN/vol - on 4 BP meds at home, holding some d/t hypotension. Keep off doxa, clonidine. Below dry wt 5 Anemia - Hb stable, no esa 6 HPTH cont renvela ac 7 Afib w RVR - on MTP 12.5 bid 8 DM per primary  Plan- HD tomorrow after new cath placement    Kelly Splinter MD  pager [phone]    cell [phone]  10/01/2014, 8:53 AM     Recent Labs Lab 09/29/14 0326 09/30/14 0147 10/01/14 0250  NA 137 134* 135*  K 4.8 4.4 4.6  CL 96 95* 93*  CO2 23 21 23   GLUCOSE 156* 159* 129*  BUN 39* 54* 64*  CREATININE 7.34* 8.99* 10.73*  CALCIUM 9.1 8.9 8.9    Recent Labs Lab 09/28/14 1817  AST 31  ALT 24  ALKPHOS 129*  BILITOT 0.7  PROT 8.3  ALBUMIN 3.2*    Recent Labs Lab 09/28/14 1817 09/29/14 0326 09/30/14 0147 10/01/14 0250  WBC 16.9* 13.9* 12.0* 9.6  NEUTROABS 14.6*  --   --   --   HGB 11.4* 10.2* 9.7* 11.4*  HCT 34.3* 31.5* 29.6* 36.0*  MCV 83.5 82.5 80.9 80.9  PLT 139* 144* 139* 190   . aspirin  325 mg Oral  Daily  . Chlorhexidine Gluconate Cloth  6 each Topical Q0600  . doxazosin  4 mg Oral QHS  . famotidine  20 mg Oral Daily  . gabapentin  100 mg Oral TID  . heparin  5,000 Units Subcutaneous 3 times per day  . Influenza vac split quadrivalent PF  0.5 mL Intramuscular Tomorrow-1000  . insulin aspart  0-20 Units Subcutaneous TID WC  . insulin aspart  0-5 Units Subcutaneous QHS  . insulin glargine  13 Units Subcutaneous Daily  . metoprolol tartrate  25 mg Oral BID  . mupirocin ointment  1 application Nasal BID  . sevelamer carbonate  1,600 mg Oral TID WC  . sodium chloride  3 mL Intravenous Q12H  . torsemide  40 mg Oral Daily  . [START ON 10/04/2014] Vitamin D (Ergocalciferol)  50,000 Units Oral Q7 days     acetaminophen **OR** acetaminophen, oxyCODONE

## 2014-10-01 NOTE — Clinical Documentation Improvement (Signed)
Possible Conditions?  Right Pleural Effusion  Congestive Heart Failure Other Not able to determine   Risk Factors: Got IVF in the ED, but clinically patient is actually fluid overloaded per 10/30 progress notes.  Diagnostics: Volume overload with basilar hazy edema and right pleural effusion per 10/30 CXR.   Thank you,  Theron Arista, Clinical Documentation Specialist:  [place] Information Management

## 2014-10-02 ENCOUNTER — Inpatient Hospital Stay (HOSPITAL_COMMUNITY): Payer: 59

## 2014-10-02 DIAGNOSIS — A4101 Sepsis due to Methicillin susceptible Staphylococcus aureus: Secondary | ICD-10-CM

## 2014-10-02 DIAGNOSIS — M4806 Spinal stenosis, lumbar region: Secondary | ICD-10-CM

## 2014-10-02 LAB — URINE CULTURE: Colony Count: 6000

## 2014-10-02 LAB — GLUCOSE, CAPILLARY
GLUCOSE-CAPILLARY: 110 mg/dL — AB (ref 70–99)
Glucose-Capillary: 122 mg/dL — ABNORMAL HIGH (ref 70–99)
Glucose-Capillary: 109 mg/dL — ABNORMAL HIGH (ref 70–99)

## 2014-10-02 LAB — CBC
HCT: 27.7 % — ABNORMAL LOW (ref 39.0–52.0)
Hemoglobin: 9.1 g/dL — ABNORMAL LOW (ref 13.0–17.0)
MCH: 26.6 pg (ref 26.0–34.0)
MCHC: 32.9 g/dL (ref 30.0–36.0)
MCV: 81 fL (ref 78.0–100.0)
Platelets: 348 10*3/uL (ref 150–400)
RBC: 3.42 MIL/uL — AB (ref 4.22–5.81)
RDW: 18.1 % — AB (ref 11.5–15.5)
WBC: 11.4 10*3/uL — AB (ref 4.0–10.5)

## 2014-10-02 LAB — HEPATITIS C ANTIBODY (REFLEX): HCV AB: NEGATIVE

## 2014-10-02 LAB — RENAL FUNCTION PANEL
Albumin: 2.2 g/dL — ABNORMAL LOW (ref 3.5–5.2)
Anion gap: 22 — ABNORMAL HIGH (ref 5–15)
BUN: 78 mg/dL — ABNORMAL HIGH (ref 6–23)
CHLORIDE: 92 meq/L — AB (ref 96–112)
CO2: 20 meq/L (ref 19–32)
CREATININE: 13.23 mg/dL — AB (ref 0.50–1.35)
Calcium: 8.7 mg/dL (ref 8.4–10.5)
GFR, EST AFRICAN AMERICAN: 4 mL/min — AB (ref >90)
GFR, EST NON AFRICAN AMERICAN: 4 mL/min — AB (ref >90)
Glucose, Bld: 126 mg/dL — ABNORMAL HIGH (ref 70–99)
Phosphorus: 6.2 mg/dL — ABNORMAL HIGH (ref 2.3–4.6)
Potassium: 5 mEq/L (ref 3.7–5.3)
SODIUM: 134 meq/L — AB (ref 137–147)

## 2014-10-02 LAB — C-REACTIVE PROTEIN: CRP: 24.4 mg/dL — ABNORMAL HIGH (ref <0.60)

## 2014-10-02 LAB — APTT: aPTT: 39 seconds — ABNORMAL HIGH (ref 24–37)

## 2014-10-02 LAB — PROTIME-INR
INR: 1.18 (ref 0.00–1.49)
Prothrombin Time: 15.1 seconds (ref 11.6–15.2)

## 2014-10-02 LAB — SEDIMENTATION RATE: SED RATE: 98 mm/h — AB (ref 0–16)

## 2014-10-02 LAB — HIV ANTIBODY (ROUTINE TESTING W REFLEX): HIV 1&2 Ab, 4th Generation: NONREACTIVE

## 2014-10-02 MED ORDER — HEPARIN SODIUM (PORCINE) 5000 UNIT/ML IJ SOLN
5000.0000 [IU] | Freq: Three times a day (TID) | INTRAMUSCULAR | Status: DC
  Administered 2014-10-02 – 2014-10-06 (×11): 5000 [IU] via SUBCUTANEOUS
  Filled 2014-10-02 (×13): qty 1

## 2014-10-02 MED ORDER — FENTANYL CITRATE 0.05 MG/ML IJ SOLN
INTRAMUSCULAR | Status: AC | PRN
  Administered 2014-10-02: 50 ug via INTRAVENOUS

## 2014-10-02 MED ORDER — ALTEPLASE 2 MG IJ SOLR: 2.0000 mg | Freq: Once | INTRAMUSCULAR | Status: DC | PRN

## 2014-10-02 MED ORDER — SODIUM CHLORIDE 0.9 % IV SOLN
INTRAVENOUS | Status: AC | PRN
  Administered 2014-10-02: 10 mL/h via INTRAVENOUS

## 2014-10-02 MED ORDER — SODIUM CHLORIDE 0.9 % IV SOLN: 100.0000 mL | INTRAVENOUS | Status: DC | PRN

## 2014-10-02 MED ORDER — HEPARIN SODIUM (PORCINE) 1000 UNIT/ML DIALYSIS: 1000.0000 [IU] | INTRAMUSCULAR | Status: DC | PRN

## 2014-10-02 MED ORDER — MIDAZOLAM HCL 2 MG/2ML IJ SOLN
INTRAMUSCULAR | Status: AC | PRN
  Administered 2014-10-02: 1 mg via INTRAVENOUS

## 2014-10-02 MED ORDER — MIDAZOLAM HCL 2 MG/2ML IJ SOLN
INTRAMUSCULAR | Status: AC
  Filled 2014-10-02: qty 2

## 2014-10-02 MED ORDER — LIDOCAINE-PRILOCAINE 2.5-2.5 % EX CREA: 1.0000 "application " | TOPICAL_CREAM | CUTANEOUS | Status: DC | PRN

## 2014-10-02 MED ORDER — HEPARIN SODIUM (PORCINE) 1000 UNIT/ML IJ SOLN
INTRAMUSCULAR | Status: AC
  Filled 2014-10-02: qty 1

## 2014-10-02 MED ORDER — VANCOMYCIN HCL IN DEXTROSE 1-5 GM/200ML-% IV SOLN
1000.0000 mg | Freq: Once | INTRAVENOUS | Status: AC
  Administered 2014-10-02: 1000 mg via INTRAVENOUS
  Filled 2014-10-02 (×2): qty 200

## 2014-10-02 MED ORDER — FENTANYL CITRATE 0.05 MG/ML IJ SOLN
INTRAMUSCULAR | Status: AC
  Filled 2014-10-02: qty 2

## 2014-10-02 MED ORDER — CEFAZOLIN SODIUM-DEXTROSE 2-3 GM-% IV SOLR
2.0000 g | INTRAVENOUS | Status: AC
  Administered 2014-10-02: 2 g via INTRAVENOUS
  Filled 2014-10-02: qty 50

## 2014-10-02 MED ORDER — HEPARIN SODIUM (PORCINE) 1000 UNIT/ML DIALYSIS: 2000.0000 [IU] | INTRAMUSCULAR | Status: DC | PRN

## 2014-10-02 MED ORDER — OXYCODONE HCL 5 MG PO TABS
ORAL_TABLET | ORAL | Status: AC
  Filled 2014-10-02: qty 2

## 2014-10-02 MED ORDER — OXYCODONE HCL 5 MG PO TABS
5.0000 mg | ORAL_TABLET | Freq: Four times a day (QID) | ORAL | Status: DC | PRN
  Administered 2014-10-02 – 2014-10-05 (×9): 10 mg via ORAL
  Administered 2014-10-06: 5 mg via ORAL
  Administered 2014-10-06: 10 mg via ORAL
  Filled 2014-10-02 (×10): qty 2

## 2014-10-02 MED ORDER — NEPRO/CARBSTEADY PO LIQD: 237.0000 mL | ORAL | Status: DC | PRN

## 2014-10-02 MED ORDER — LABETALOL HCL 300 MG PO TABS
300.0000 mg | ORAL_TABLET | Freq: Two times a day (BID) | ORAL | Status: DC
  Administered 2014-10-02 – 2014-10-04 (×5): 300 mg via ORAL
  Filled 2014-10-02 (×6): qty 1

## 2014-10-02 MED ORDER — PENTAFLUOROPROP-TETRAFLUOROETH EX AERO: 1.0000 "application " | INHALATION_SPRAY | CUTANEOUS | Status: DC | PRN

## 2014-10-02 MED ORDER — LIDOCAINE HCL (PF) 1 % IJ SOLN: 5.0000 mL | INTRAMUSCULAR | Status: DC | PRN

## 2014-10-02 MED ORDER — SODIUM CHLORIDE 0.9 % IV SOLN
INTRAVENOUS | Status: DC
  Administered 2014-10-03: 500 mL via INTRAVENOUS

## 2014-10-02 MED ORDER — LISINOPRIL 40 MG PO TABS
40.0000 mg | ORAL_TABLET | Freq: Every evening | ORAL | Status: DC
  Administered 2014-10-03 – 2014-10-06 (×4): 40 mg via ORAL
  Filled 2014-10-02 (×5): qty 1

## 2014-10-02 MED ORDER — LIDOCAINE-EPINEPHRINE (PF) 1 %-1:200000 IJ SOLN
INTRAMUSCULAR | Status: AC
  Filled 2014-10-02: qty 10

## 2014-10-02 NOTE — Plan of Care (Signed)
Problem: Phase I Progression Outcomes Goal: Hemodynamically stable Outcome: Progressing     

## 2014-10-02 NOTE — Consult Note (Signed)
Chief Complaint: Chief Complaint  Patient presents with  . Tachycardia  . Fever   Bacteremia HD cath removed 10/31  Referring Physician(s): Dr Jonnie Finner  History of Present Illness: Noah Vang is a 58 y.o. male  Pt sent to ED 10/30 from dialysis center - tachycardic Chills/fever Admitted then +BC: MRSA Bacteremia HD catheter removed 10/31 Has been on Vanco and Zosyn IV since then Afeb; wbc 9.6 Request for new placement of tunneled hemodialysis catheter per Renal MD Dr Pascal Lux has reviewed chart and imaging and agrees pt is appropriate candidate for same   Past Medical History  Diagnosis Date  . Diabetes mellitus without complication   . Renal disorder   . Dialysis patient   . Hypertension     History reviewed. No pertinent past surgical history.  Allergies: Review of patient's allergies indicates no known allergies.  Medications: Prior to Admission medications   Medication Sig Start Date End Date Taking? Authorizing Provider  cloNIDine (CATAPRES) 0.3 MG tablet Take 0.3 mg by mouth every 8 (eight) hours.   Yes Historical Provider, MD  doxazosin (CARDURA) 4 MG tablet Take 4 mg by mouth at bedtime.   Yes Historical Provider, MD  famotidine (PEPCID) 20 MG tablet Take 20 mg by mouth daily.   Yes Historical Provider, MD  gabapentin (NEURONTIN) 100 MG capsule Take 100 mg by mouth 3 (three) times daily.   Yes Historical Provider, MD  insulin glargine (LANTUS) 100 UNIT/ML injection Inject 13 Units into the skin daily.   Yes Historical Provider, MD  labetalol (NORMODYNE) 300 MG tablet Take 300 mg by mouth 3 (three) times daily.   Yes Historical Provider, MD  lisinopril (PRINIVIL,ZESTRIL) 40 MG tablet Take 40 mg by mouth daily.   Yes Historical Provider, MD  sevelamer carbonate (RENVELA) 800 MG tablet Take 1,600 mg by mouth 3 (three) times daily with meals.   Yes Historical Provider, MD  torsemide (DEMADEX) 20 MG tablet Take 40 mg by mouth daily.   Yes Historical Provider,  MD  Vitamin D, Ergocalciferol, (DRISDOL) 50000 UNITS CAPS capsule Take 50,000 Units by mouth every 7 (seven) days. On Thursday   Yes Historical Provider, MD    History reviewed. No pertinent family history.  History   Social History  . Marital Status: Unknown    Spouse Name: N/A    Number of Children: N/A  . Years of Education: N/A   Social History Main Topics  . Smoking status: Never Smoker   . Smokeless tobacco: None  . Alcohol Use: No  . Drug Use: None  . Sexual Activity: None   Other Topics Concern  . None   Social History Narrative     Review of Systems: A 12 point ROS discussed and pertinent positives are indicated in the HPI above.  All other systems are negative.  Review of Systems  Constitutional: Negative for fever, chills, activity change and fatigue.  Respiratory: Negative for cough, chest tightness and shortness of breath.   Cardiovascular: Negative for chest pain.  Gastrointestinal: Negative for abdominal pain.  Neurological: Positive for weakness.  Psychiatric/Behavioral: Negative for behavioral problems and confusion.    Vital Signs: BP 134/84 mmHg  Pulse 91  Temp(Src) 98.2 F (36.8 C) (Oral)  Resp 17  Ht 6' (1.829 m)  Wt 115.803 kg (255 lb 4.8 oz)  BMI 34.62 kg/m2  SpO2 95%  Physical Exam  Constitutional: He is oriented to person, place, and time. He appears well-nourished.  Cardiovascular: Normal rate and regular rhythm.  No murmur heard. Pulmonary/Chest: Effort normal and breath sounds normal. He has no wheezes.  Abdominal: Soft. Bowel sounds are normal. There is no tenderness.  Musculoskeletal: Normal range of motion.  Neurological: He is alert and oriented to person, place, and time.  Skin: Skin is warm and dry.  Psychiatric: He has a normal mood and affect. His behavior is normal. Judgment and thought content normal.  Nursing note and vitals reviewed.   Imaging: Dg Chest 2 View  09/26/2014   CLINICAL DATA:  Shortness of breath.   Renal failure on dialysis.  EXAM: CHEST  2 VIEW  COMPARISON:  08/22/2014  FINDINGS: Cardiomegaly and pulmonary vascular congestion remains stable. There is a new small right pleural effusion and mild right basilar atelectasis. Right internal jugular dual-lumen central venous dialysis catheter remains in place.  IMPRESSION: New small right pleural effusion mild right basilar atelectasis.  Stable cardiomegaly and pulmonary vascular congestion.   Electronically Signed   By: Earle Gell M.D.   On: 09/26/2014 13:20   Ct Head Wo Contrast  09/26/2014   CLINICAL DATA:  Dizziness, lightheadedness  EXAM: CT HEAD WITHOUT CONTRAST  TECHNIQUE: Contiguous axial images were obtained from the base of the skull through the vertex without intravenous contrast.  COMPARISON:  08/22/2014  FINDINGS: There is no evidence of mass effect, midline shift or extra-axial fluid collections. There is no evidence of a space-occupying lesion or intracranial hemorrhage. There is no evidence of a cortical-based area of acute infarction.  The ventricles and sulci are appropriate for the patient's age. The basal cisterns are patent.  Visualized portions of the orbits are unremarkable. Bilateral mild ethmoid sinus mucosal thickening. Intracranial cerebrovascular atherosclerotic disease.  The osseous structures are unremarkable.  IMPRESSION: No acute intracranial pathology.   Electronically Signed   By: Kathreen Devoid   On: 09/26/2014 13:06   Mr Thoracic Spine Wo Contrast  09/30/2014   CLINICAL DATA:  Left leg weakness. Diabetes and hypertension. ESRD on dialysis  EXAM: MRI THORACIC AND LUMBAR SPINE WITHOUT CONTRAST  TECHNIQUE: Multiplanar and multiecho pulse sequences of the thoracic and lumbar spine were obtained without intravenous contrast.  COMPARISON:  None.  FINDINGS: MR THORACIC SPINE FINDINGS  Image quality is limited due to motion and body habitus.  Negative for fracture or mass lesion. No cord compression. No cord lesion is identified.  Cord evaluation is limited due to motion.  Negative for disc protrusion or spinal stenosis.  Cellular bone marrow pattern consistent with anemia and dialysis.  MR LUMBAR SPINE FINDINGS  Negative for lumbar fracture or mass lesion. Conus medullaris is normal and terminates at T12-L1.  T12-L1:  Negative  L1-2:  Negative  L2-3: Early facet degeneration. Negative for spinal stenosis or disc protrusion.  L3-4: Mild facet degeneration. Negative for disc protrusion or spinal stenosis. Early disc degeneration.  L4-5: 4 mm anterior slip with disc and facet degeneration. Marked bony overgrowth of the facet joints. This causes moderate to severe spinal stenosis. Subarticular stenosis bilaterally causing impingement of the L5 nerve root bilaterally right greater than left  L5-S1:  Negative  IMPRESSION: MR THORACIC SPINE IMPRESSION  Limited image quality the thoracic spine.  No acute abnormality  MR LUMBAR SPINE IMPRESSION  Limited image quality in the lumbar spine.  Moderate to severe spinal stenosis at L4-5 with grade 1 anterior slip of L4 and L5. L5 nerve root impingement bilaterally.   Electronically Signed   By: Franchot Gallo M.D.   On: 09/30/2014 16:06   Mr Lumbar  Spine Wo Contrast  09/30/2014   CLINICAL DATA:  Left leg weakness. Diabetes and hypertension. ESRD on dialysis  EXAM: MRI THORACIC AND LUMBAR SPINE WITHOUT CONTRAST  TECHNIQUE: Multiplanar and multiecho pulse sequences of the thoracic and lumbar spine were obtained without intravenous contrast.  COMPARISON:  None.  FINDINGS: MR THORACIC SPINE FINDINGS  Image quality is limited due to motion and body habitus.  Negative for fracture or mass lesion. No cord compression. No cord lesion is identified. Cord evaluation is limited due to motion.  Negative for disc protrusion or spinal stenosis.  Cellular bone marrow pattern consistent with anemia and dialysis.  MR LUMBAR SPINE FINDINGS  Negative for lumbar fracture or mass lesion. Conus medullaris is normal and  terminates at T12-L1.  T12-L1:  Negative  L1-2:  Negative  L2-3: Early facet degeneration. Negative for spinal stenosis or disc protrusion.  L3-4: Mild facet degeneration. Negative for disc protrusion or spinal stenosis. Early disc degeneration.  L4-5: 4 mm anterior slip with disc and facet degeneration. Marked bony overgrowth of the facet joints. This causes moderate to severe spinal stenosis. Subarticular stenosis bilaterally causing impingement of the L5 nerve root bilaterally right greater than left  L5-S1:  Negative  IMPRESSION: MR THORACIC SPINE IMPRESSION  Limited image quality the thoracic spine.  No acute abnormality  MR LUMBAR SPINE IMPRESSION  Limited image quality in the lumbar spine.  Moderate to severe spinal stenosis at L4-5 with grade 1 anterior slip of L4 and L5. L5 nerve root impingement bilaterally.   Electronically Signed   By: Franchot Gallo M.D.   On: 09/30/2014 16:06   Mr Hip Left Wo Contrast  10/02/2014   CLINICAL DATA:  Left hip pain radiating down the leg.  EXAM: MR OF THE LEFT HIP WITHOUT CONTRAST  TECHNIQUE: Multiplanar, multisequence MR imaging was performed. No intravenous contrast was administered.  COMPARISON:  08/15/2014  FINDINGS: Bones: Abnormal left sacroiliac joint with joint effusion inferiorly (image 5, series 6), and abnormal edema signal along the margins of the joint, especially the iliac side. Subtle edema anteriorly in the left pubic body.  Articular cartilage and labrum  Articular cartilage: Mild to moderate but symmetric degenerative articular cartilage thinning. Subcortical cyst formation laterally in the acetabulum.  Labrum:  Grossly intact  Joint or bursal effusion  Joint effusion:  Absent  Bursae:  Unremarkable  Muscles and tendons  Muscles and tendons: There is abnormal edema tracking along tissue planes adjacent to the left sacroiliac joint, including deep to the left iliacus muscle, the sciatic notch, and adjacent left gluteal musculature. Presacral fluid noted  along with edema signal in the left piriformis muscle. Low-level but symmetric edema tracks along the hip adductor musculature. Mild proximal hamstring tendinopathy on the left.  Other findings  Miscellaneous: Transitional lumbosacral vertebra with broad transverse processes articulating with the rest of the sacrum.  IMPRESSION: 1. Abnormal edema along the left sacroiliac joint with a small SI joint effusion and edema tracking along adjacent muscular and tissue planes. Appearance concerning for septic left sacroiliac joint. 2. Bilateral degenerative arthropathy of the hips. 3. Proximal hamstring tendinopathy on the left.   Electronically Signed   By: Sherryl Barters M.D.   On: 10/02/2014 11:48   Ir Removal Tun Cv Cath W/o Fl  09/29/2014   CLINICAL DATA:  Renal failure, bacteremia and tenderness overlying a right chest tunneled dialysis catheter. The patient requires removal of the tunneled catheter due to bacteremia.  EXAM: REMOVAL OF TUNNELED CENTRAL VENOUS CATHETER  PROCEDURE:  The right chest dialysis catheter site was prepped with chlorhexidine. A sterile gown and gloves were worn during the procedure. Local anesthesia was provided with 1% lidocaine.  Utilizing sharp and blunt dissection, the subcutaneous cuff of the dialysis catheter was freed. The catheter was then successfully removed in its entirety. A sterile dressing was applied over the catheter exit site.  IMPRESSION: Removal of tunneled dialysis catheter utilizing sharp and blunt dissection. The procedure was uncomplicated.   Electronically Signed   By: Aletta Edouard M.D.   On: 09/29/2014 14:13   Dg Chest Port 1 View  (if Code Sepsis Called)  09/28/2014   CLINICAL DATA:  Fever.  Tachycardia.  Confusion.  EXAM: PORTABLE CHEST - 1 VIEW  COMPARISON:  09/26/2014  FINDINGS: Moderate cardiomegaly. Hazy bibasilar airspace disease worrisome for edema. Vascular congestion. Small right pleural effusion. No pneumothorax. Stable right internal jugular  tunneled dialysis catheter. Tip is at the cavoatrial junction.  IMPRESSION: Volume overload with basilar hazy edema and right pleural effusion.   Electronically Signed   By: Maryclare Bean M.D.   On: 09/28/2014 18:59    Labs:  CBC:  Recent Labs  09/28/14 1817 09/29/14 0326 09/30/14 0147 10/01/14 0250  WBC 16.9* 13.9* 12.0* 9.6  HGB 11.4* 10.2* 9.7* 11.4*  HCT 34.3* 31.5* 29.6* 36.0*  PLT 139* 144* 139* 190    COAGS: No results for input(s): INR, APTT in the last 8760 hours.  BMP:  Recent Labs  09/28/14 1817 09/29/14 0326 09/30/14 0147 10/01/14 0250  NA 138 137 134* 135*  K 4.6 4.8 4.4 4.6  CL 95* 96 95* 93*  CO2 24 23 21 23   GLUCOSE 121* 156* 159* 129*  BUN 30* 39* 54* 64*  CALCIUM 10.1 9.1 8.9 8.9  CREATININE 6.36* 7.34* 8.99* 10.73*  GFRNONAA 9* 7* 6* 5*  GFRAA 10* 9* 7* 5*    LIVER FUNCTION TESTS:  Recent Labs  09/28/14 1817  BILITOT 0.7  AST 31  ALT 24  ALKPHOS 129*  PROT 8.3  ALBUMIN 3.2*    TUMOR MARKERS: No results for input(s): AFPTM, CEA, CA199, CHROMGRNA in the last 8760 hours.  Assessment and Plan:  ESRD +MRSA bacteremia On Vanco HD cath removed 10/31 Now scheduled for new placement Afeb; wbc wnl; Hep inj held Pt now scheduled for tunneled HD cath placement Pt aware of procedure benefits and risks and agreeable to proceed Consent signed and in chart Ancef on call for procedure  Thank you for this interesting consult.  I greatly enjoyed meeting Noah Vang and look forward to participating in their care.    I spent a total of 20 minutes face to face in clinical consultation, greater than 50% of which was counseling/coordinating care for new HD tunneled cath  Signed: Caedmon Louque A 10/02/2014, 12:35 PM

## 2014-10-02 NOTE — Progress Notes (Signed)
PROGRESS NOTE  Noah Vang [id number] DOB: 1956-06-18 DOA: 09/28/2014 PCP: No PCP Per Patient  Noah Vang is a 58 y.o. male sent to the ED from dialysis with tachycardia, fever, chills, hypertension. Symptoms onset during dialysis today. There is associated tenderness, swelling, and warmth at his RIJ tunneled catheter which is his dialysis access site. He states they still finished dialysis today (although he is still fluid overloaded on CXR and hypertensive). Patient reports his access site which has been in for the past 1 month, became tender, swollen, etc 1 week ago.  Patient grew MRSA bacteremia- catheter removed; new access placed 11/3- HD done- needs TEE  Assessment/Plan: Sepsis due to MRSA bacteremia- most likely source is his RIJ tunneled dialysis catheter  BCx + 2/2 -repeated 11/1- NGTD Vanc/cefazolin -needs TEE (cards consult) Nephrology consult- for dialysis today IR pulled infected catheter 10/31  -MRI of spine shows:  Moderate to severe spinal stenosis at L4-5 with grade 1 anterior slip of L4 and L5. L5 nerve root impingement          bilaterally -- follow up outpatient- PRN pain meds  HTN - BB  ESRD - nephrology consulted-  access placed 11/3  DM - continue home lantus, CBG checks AC/HS        Paroxysmal Atrial fib - control with BB, added ASA- back in sinus- discussed further anticoagulation with patient- declined for now        Increased troponins- from rapid heart rate, sepsis- decreasing   Code Status: full Family Communication: patient Disposition Plan:    Consultants:  IR  Nephrology  ID  Procedures:  IR- catheter pulled    HPI/Subjective: C/o hip pain No SOB, no CP   Objective: Filed Vitals:   10/02/14 0721  BP: 134/84  Pulse: 91  Temp:   Resp: 17    Intake/Output Summary (Last 24 hours) at 10/02/14 0922 Last data filed at 10/02/14 0723  Gross per 24 hour  Intake    540 ml  Output      0 ml  Net    540 ml    Filed Weights   10/01/14 0515 10/01/14 1912 10/02/14 0721  Weight: 114.4 kg (252 lb 3.3 oz) 115.985 kg (255 lb 11.2 oz) 115.803 kg (255 lb 4.8 oz)    Exam:   General:  A+Ox3, NAD  Cardiovascular: rrr  Respiratory: clear  Abdomen: +BS, soft  Musculoskeletal: min LE edema   Data Reviewed: Basic Metabolic Panel:  Recent Labs Lab 09/26/14 1126 09/28/14 1817 09/29/14 0326 09/30/14 0147 10/01/14 0250  NA 140 138 137 134* 135*  K 5.2 4.6 4.8 4.4 4.6  CL 97 95* 96 95* 93*  CO2 22 24 23 21 23   GLUCOSE 138* 121* 156* 159* 129*  BUN 46* 30* 39* 54* 64*  CREATININE 8.28* 6.36* 7.34* 8.99* 10.73*  CALCIUM 10.0 10.1 9.1 8.9 8.9   Liver Function Tests:  Recent Labs Lab 09/28/14 1817  AST 31  ALT 24  ALKPHOS 129*  BILITOT 0.7  PROT 8.3  ALBUMIN 3.2*   No results for input(s): LIPASE, AMYLASE in the last 168 hours. No results for input(s): AMMONIA in the last 168 hours. CBC:  Recent Labs Lab 09/26/14 1126 09/28/14 1817 09/29/14 0326 09/30/14 0147 10/01/14 0250  WBC 12.0* 16.9* 13.9* 12.0* 9.6  NEUTROABS  --  14.6*  --   --   --   HGB 11.4* 11.4* 10.2* 9.7* 11.4*  HCT 35.1* 34.3* 31.5* 29.6* 36.0*  MCV  85.6 83.5 82.5 80.9 80.9  PLT 161 139* 144* 139* 190   Cardiac Enzymes:  Recent Labs Lab 09/30/14 1358 09/30/14 1931 10/01/14 0250 10/01/14 0856 10/01/14 1337  TROPONINI <0.30 <0.30 <0.30 <0.30 <0.30   BNP (last 3 results) No results for input(s): PROBNP in the last 8760 hours. CBG:  Recent Labs Lab 10/01/14 0739 10/01/14 1136 10/01/14 1703 10/01/14 2158 10/02/14 0653  GLUCAP 137* 98 153* 111* 110*    Recent Results (from the past 240 hour(s))  Culture, blood (routine x 2)     Status: None   Collection Time: 09/28/14  6:48 PM  Result Value Ref Range Status   Specimen Description BLOOD RIGHT ARM  Final   Special Requests BOTTLES DRAWN AEROBIC AND ANAEROBIC 5CC  Final   Culture  Setup Time   Final    09/28/2014 22:33 Performed at  [facility]    Culture   Final    STAPHYLOCOCCUS AUREUS Note: SUSCEPTIBILITIES PERFORMED ON PREVIOUS CULTURE WITHIN THE LAST 5 DAYS. Note: Gram Stain Report Called to,Read Back By and Verified With: KAITLYN REID 09/29/14 @ 12:21PM BY RUSCOE A. Performed at [facility]    Report Status 10/01/2014 FINAL  Final  Culture, blood (routine x 2)     Status: None   Collection Time: 09/28/14  7:29 PM  Result Value Ref Range Status   Specimen Description BLOOD HAND RIGHT  Final   Special Requests BOTTLES DRAWN AEROBIC AND ANAEROBIC 5CC  Final   Culture  Setup Time   Final    09/29/2014 01:20 Performed at [facility]    Culture   Final    METHICILLIN RESISTANT STAPHYLOCOCCUS AUREUS Note: RIFAMPIN AND GENTAMICIN SHOULD NOT BE USED AS SINGLE DRUGS FOR TREATMENT OF STAPH INFECTIONS. CRITICAL RESULT CALLED TO, READ BACK BY AND VERIFIED WITH: RICKY OUSTERMAN 09/30/14 @ 8:17PM BY RUSCOE A. Note: Gram Stain Report Called to,Read Back By and Verified With: KAITLYN REID 09/29/14 @ 2:59PM BY RUSCOE A. Performed at [facility]    Report Status 10/01/2014 FINAL  Final   Organism ID, Bacteria METHICILLIN RESISTANT STAPHYLOCOCCUS AUREUS  Final      Susceptibility   Methicillin resistant staphylococcus aureus - MIC*    CLINDAMYCIN >=8 RESISTANT Resistant     ERYTHROMYCIN >=8 RESISTANT Resistant     GENTAMICIN <=0.5 SENSITIVE Sensitive     LEVOFLOXACIN >=8 RESISTANT Resistant     OXACILLIN >=4 RESISTANT Resistant     PENICILLIN >=0.5 RESISTANT Resistant     RIFAMPIN <=0.5 SENSITIVE Sensitive     TRIMETH/SULFA <=10 SENSITIVE Sensitive     VANCOMYCIN 1 SENSITIVE Sensitive     TETRACYCLINE <=1 SENSITIVE Sensitive     * METHICILLIN RESISTANT STAPHYLOCOCCUS AUREUS  MRSA PCR Screening     Status: Abnormal   Collection Time: 09/28/14 10:03 PM  Result Value Ref Range Status   MRSA by PCR POSITIVE (A) NEGATIVE Final    Comment:        The GeneXpert MRSA Assay (FDA approved  for NASAL specimens only), is one component of a comprehensive MRSA colonization surveillance program. It is not intended to diagnose MRSA infection nor to guide or monitor treatment for MRSA infections. RESULT CALLED TO, READ BACK BY AND VERIFIED WITH: CALLED TO RN [facility] EVANGELISTA [id number] @[phone]  THANEY  Culture, blood (routine x 2)     Status: None (Preliminary result)   Collection Time: 09/30/14  5:33 PM  Result Value Ref Range Status   Specimen Description BLOOD RIGHT  HAND  Final   Special Requests BOTTLES DRAWN AEROBIC ONLY 5CC  Final   Culture  Setup Time   Final    10/01/2014 02:24 Performed at [facility]    Culture   Final           BLOOD CULTURE RECEIVED NO GROWTH TO DATE CULTURE WILL BE HELD FOR 5 DAYS BEFORE ISSUING A FINAL NEGATIVE REPORT Performed at [facility]    Report Status PENDING  Incomplete  Culture, blood (routine x 2)     Status: None (Preliminary result)   Collection Time: 09/30/14  5:39 PM  Result Value Ref Range Status   Specimen Description BLOOD LEFT HAND  Final   Special Requests BOTTLES DRAWN AEROBIC ONLY 5CC  Final   Culture  Setup Time   Final    10/01/2014 02:24 Performed at [facility]    Culture   Final           BLOOD CULTURE RECEIVED NO GROWTH TO DATE CULTURE WILL BE HELD FOR 5 DAYS BEFORE ISSUING A FINAL NEGATIVE REPORT Performed at [facility]    Report Status PENDING  Incomplete  Urine culture     Status: None   Collection Time: 10/01/14  3:16 AM  Result Value Ref Range Status   Specimen Description URINE, RANDOM  Final   Special Requests NONE  Final   Culture  Setup Time   Final    10/01/2014 09:41 Performed at [place]   Final    6,000 COLONIES/ML Performed at [facility]    Culture   Final    INSIGNIFICANT GROWTH Performed at [facility]    Report Status 10/02/2014 FINAL  Final     Studies: Mr Thoracic Spine Wo Contrast  09/30/2014    CLINICAL DATA:  Left leg weakness. Diabetes and hypertension. ESRD on dialysis  EXAM: MRI THORACIC AND LUMBAR SPINE WITHOUT CONTRAST  TECHNIQUE: Multiplanar and multiecho pulse sequences of the thoracic and lumbar spine were obtained without intravenous contrast.  COMPARISON:  None.  FINDINGS: MR THORACIC SPINE FINDINGS  Image quality is limited due to motion and body habitus.  Negative for fracture or mass lesion. No cord compression. No cord lesion is identified. Cord evaluation is limited due to motion.  Negative for disc protrusion or spinal stenosis.  Cellular bone marrow pattern consistent with anemia and dialysis.  MR LUMBAR SPINE FINDINGS  Negative for lumbar fracture or mass lesion. Conus medullaris is normal and terminates at T12-L1.  T12-L1:  Negative  L1-2:  Negative  L2-3: Early facet degeneration. Negative for spinal stenosis or disc protrusion.  L3-4: Mild facet degeneration. Negative for disc protrusion or spinal stenosis. Early disc degeneration.  L4-5: 4 mm anterior slip with disc and facet degeneration. Marked bony overgrowth of the facet joints. This causes moderate to severe spinal stenosis. Subarticular stenosis bilaterally causing impingement of the L5 nerve root bilaterally right greater than left  L5-S1:  Negative  IMPRESSION: MR THORACIC SPINE IMPRESSION  Limited image quality the thoracic spine.  No acute abnormality  MR LUMBAR SPINE IMPRESSION  Limited image quality in the lumbar spine.  Moderate to severe spinal stenosis at L4-5 with grade 1 anterior slip of L4 and L5. L5 nerve root impingement bilaterally.   Electronically Signed   By: Franchot Gallo M.D.   On: 09/30/2014 16:06   Mr Lumbar Spine Wo Contrast  09/30/2014   CLINICAL DATA:  Left leg  weakness. Diabetes and hypertension. ESRD on dialysis  EXAM: MRI THORACIC AND LUMBAR SPINE WITHOUT CONTRAST  TECHNIQUE: Multiplanar and multiecho pulse sequences of the thoracic and lumbar spine were obtained without intravenous contrast.   COMPARISON:  None.  FINDINGS: MR THORACIC SPINE FINDINGS  Image quality is limited due to motion and body habitus.  Negative for fracture or mass lesion. No cord compression. No cord lesion is identified. Cord evaluation is limited due to motion.  Negative for disc protrusion or spinal stenosis.  Cellular bone marrow pattern consistent with anemia and dialysis.  MR LUMBAR SPINE FINDINGS  Negative for lumbar fracture or mass lesion. Conus medullaris is normal and terminates at T12-L1.  T12-L1:  Negative  L1-2:  Negative  L2-3: Early facet degeneration. Negative for spinal stenosis or disc protrusion.  L3-4: Mild facet degeneration. Negative for disc protrusion or spinal stenosis. Early disc degeneration.  L4-5: 4 mm anterior slip with disc and facet degeneration. Marked bony overgrowth of the facet joints. This causes moderate to severe spinal stenosis. Subarticular stenosis bilaterally causing impingement of the L5 nerve root bilaterally right greater than left  L5-S1:  Negative  IMPRESSION: MR THORACIC SPINE IMPRESSION  Limited image quality the thoracic spine.  No acute abnormality  MR LUMBAR SPINE IMPRESSION  Limited image quality in the lumbar spine.  Moderate to severe spinal stenosis at L4-5 with grade 1 anterior slip of L4 and L5. L5 nerve root impingement bilaterally.   Electronically Signed   By: Franchot Gallo M.D.   On: 09/30/2014 16:06    Scheduled Meds: . aspirin  325 mg Oral Daily  . Chlorhexidine Gluconate Cloth  6 each Topical Q0600  . doxazosin  4 mg Oral QHS  . famotidine  20 mg Oral Daily  . gabapentin  100 mg Oral TID  . heparin  5,000 Units Subcutaneous 3 times per day  . Influenza vac split quadrivalent PF  0.5 mL Intramuscular Tomorrow-1000  . insulin aspart  0-20 Units Subcutaneous TID WC  . insulin aspart  0-5 Units Subcutaneous QHS  . insulin glargine  13 Units Subcutaneous Daily  . metoprolol tartrate  25 mg Oral BID  . mupirocin ointment  1 application Nasal BID  .  sevelamer carbonate  1,600 mg Oral TID WC  . sodium chloride  3 mL Intravenous Q12H  . torsemide  40 mg Oral Daily  . [START ON 10/04/2014] Vitamin D (Ergocalciferol)  50,000 Units Oral Q7 days   Continuous Infusions:  Antibiotics Given (last 72 hours)    Date/Time Action Medication Dose Rate   09/30/14 1741 Given   ceFAZolin (ANCEF) IVPB 2 g/50 mL premix 2 g 100 mL/hr      Principal Problem:   CRBSI (catheter-related bloodstream infection) Active Problems:   Sepsis   Line sepsis   ESRD (end stage renal disease) on dialysis   DM (diabetes mellitus)   HTN (hypertension)   SVT (supraventricular tachycardia)   Atrial fibrillation   Type 2 diabetes mellitus with diabetic chronic kidney disease   Staphylococcus aureus bacteremia with sepsis   Spinal stenosis of lumbar region   Screen for STD (sexually transmitted disease)    Time spent: 25 min    VANN, JESSICA  [facility] Pager [phone]. If 7PM-7AM, please contact night-coverage at [email or web address], password [facility] 10/02/2014, 9:22 AM  LOS: 4 days

## 2014-10-02 NOTE — Care Management Note (Addendum)
    Page 1 of 2   10/07/2014     3:07:30 PM CARE MANAGEMENT NOTE 10/07/2014  Patient:  Noah Vang, Noah Vang   Account Number:  [id number]  Date Initiated:  10/02/2014  Documentation initiated by:  HUTCHINSON,CRYSTAL  Subjective/Objective Assessment:   Leg and hip pain, HD     Action/Plan:   CM to follow for disposition needs   Anticipated DC Date:  10/05/2014   Anticipated DC Plan:  [place]  CM consult  Other      Choice offered to / List presented to:          [facility] arranged  HH-2 PT      Status of service:  Completed, signed off Medicare Important Message given?   (If response is "NO", the following Medicare IM given date fields will be blank) Date Medicare IM given:   Medicare IM given by:   Date Additional Medicare IM given:   Additional Medicare IM given by:    Discharge Disposition:  [place]  Per UR Regulation:  Reviewed for med. necessity/level of care/duration of stay  If discussed at [place] of Stay Meetings, dates discussed:    Comments:  10/07/14 14:59 CM called Noah Vang (sister and MPOA) of pt bc pt's only number in system is a business number.  Noah Vang states HHPT would be accepted by pt and it is ok for [facility] to use her contact number to schedule: [phone]. Referral called to [facility] rep, Noah Vang with contact information.  No other CM needs were communicated.  Mariane Masters, BSN, CM [phone].  Crystal Hutchinson RN, BSN, MSHL, CCM  Nurse - Case Manager,  ([facility] [place])  [phone]  10/02/2014 Social:  from home with family CM sent request to Dr. Eliseo Vang,  Patients sister called and requested information be submitted to insurance. To:  [place]:  [phone] Noah Vang, Noah Vang DOB:  05-10-1956 Admission Date:  09/28/2014 Patient receiving HD during this admission MD Signature: Date:  Letter completed and CM faxed as requested. at 4:36pm

## 2014-10-02 NOTE — Progress Notes (Signed)
[place] [facility]    Day # 5 vancomycin  1 day ancef  3 days zosyn  Subjective:   Complaining of back pain radiating down his right leg    Antibiotics:  Anti-infectives    Start     Dose/Rate Route Frequency Ordered Stop   10/02/14 2000  vancomycin (VANCOCIN) IVPB 1000 mg/200 mL premix     1,000 mg200 mL/hr over 60 Minutes Intravenous  Once 10/02/14 1127     10/02/14 1300  ceFAZolin (ANCEF) IVPB 2 g/50 mL premix    Comments:  Hang ON CALL to xray 11/3   2 g100 mL/hr over 30 Minutes Intravenous On call 10/02/14 1251 10/02/14 1540   10/01/14 1200  vancomycin (VANCOCIN) IVPB 1000 mg/200 mL premix  Status:  Discontinued     1,000 mg200 mL/hr over 60 Minutes Intravenous Every M-W-F (Hemodialysis) 09/30/14 1619 09/30/14 1622   10/01/14 1200  ceFAZolin (ANCEF) IVPB 2 g/50 mL premix  Status:  Discontinued     2 g100 mL/hr over 30 Minutes Intravenous Every M-W-F (Hemodialysis) 09/30/14 1619 09/30/14 1622   09/30/14 1700  ceFAZolin (ANCEF) IVPB 2 g/50 mL premix     2 g100 mL/hr over 30 Minutes Intravenous  Once 09/30/14 1619 09/30/14 1811   09/28/14 2000  piperacillin-tazobactam (ZOSYN) IVPB 2.25 g  Status:  Discontinued     2.25 g100 mL/hr over 30 Minutes Intravenous Every 8 hours 09/28/14 1905 09/30/14 1610   09/28/14 1915  vancomycin (VANCOCIN) 2,000 mg in sodium chloride 0.9 % 500 mL IVPB     2,000 mg250 mL/hr over 120 Minutes Intravenous  Once 09/28/14 1905 09/28/14 2208      Medications: Scheduled Meds: . aspirin  325 mg Oral Daily  . Chlorhexidine Gluconate Cloth  6 each Topical Q0600  . famotidine  20 mg Oral Daily  . gabapentin  100 mg Oral TID  . heparin      . heparin  5,000 Units Subcutaneous 3 times per day  . Influenza vac split quadrivalent PF  0.5 mL Intramuscular Tomorrow-1000  . insulin aspart  0-20 Units Subcutaneous TID WC  . insulin aspart  0-5 Units Subcutaneous QHS  . insulin glargine  13 Units Subcutaneous Daily  . labetalol  300  mg Oral BID  . lidocaine-EPINEPHrine      . lisinopril  40 mg Oral QPM  . mupirocin ointment  1 application Nasal BID  . sevelamer carbonate  1,600 mg Oral TID WC  . sodium chloride  3 mL Intravenous Q12H  . vancomycin  1,000 mg Intravenous Once  . [START ON 10/04/2014] Vitamin D (Ergocalciferol)  50,000 Units Oral Q7 days   Continuous Infusions:  PRN Meds:.acetaminophen **OR** acetaminophen, oxyCODONE    Objective: Weight change: 3 lb 7.9 oz (1.585 kg)  Intake/Output Summary (Last 24 hours) at 10/02/14 1600 Last data filed at 10/02/14 1407  Gross per 24 hour  Intake    320 ml  Output      0 ml  Net    320 ml   Blood pressure 154/84, pulse 91, temperature 98.2 F (36.8 C), temperature source Oral, resp. rate 15, height 6' (1.829 m), weight 255 lb 4.8 oz (115.803 kg), SpO2 94 %. Temp:  [98.1 F (36.7 C)-98.6 F (37 C)] 98.2 F (36.8 C) (11/03 0221) Pulse Rate:  [91-100] 91 (11/03 1546) Resp:  [13-20] 15 (11/03 1546) BP: (134-167)/(84-95) 154/84 mmHg (11/03 1546) SpO2:  [94 %-99 %] 94 % (11/03 1546) Weight:  [id number]  lb (115.667 kg)-255 lb 11.2 oz (115.985 kg)] 255 lb (115.667 kg) (11/03 1044)  Physical Exam: General: Alert and awake, oriented x3, not in any acute distress. HEENT: anicteric sclera,  EOMI CVS regular rate, normal r,  no murmur rubs or gallops Chest: clear to auscultation bilaterally, no wheezing, rales or rhonchi Abdomen: soft nontender, nondistended, normal bowel sounds, Extremities: no  clubbing or edema noted bilaterally Skin: no rashes Neuro: nonfocal  CBC: CBC Latest Ref Rng 10/01/2014 09/30/2014 09/29/2014  WBC 4.0 - 10.5 K/uL 9.6 12.0(H) 13.9(H)  Hemoglobin 13.0 - 17.0 g/dL 11.4(L) 9.7(L) 10.2(L)  Hematocrit 39.0 - 52.0 % 36.0(L) 29.6(L) 31.5(L)  Platelets 150 - 400 K/uL 190 139(L) 144(L)     BMET  Recent Labs  09/30/14 0147 10/01/14 0250  NA 134* 135*  K 4.4 4.6  CL 95* 93*  CO2 21 23  GLUCOSE 159* 129*  BUN 54* 64*  CREATININE 8.99*  10.73*  CALCIUM 8.9 8.9     Liver Panel  No results for input(s): PROT, ALBUMIN, AST, ALT, ALKPHOS, BILITOT, BILIDIR, IBILI in the last 72 hours.     Sedimentation Rate  Recent Labs  10/02/14 0541  ESRSEDRATE 98*   C-Reactive Protein  Recent Labs  10/02/14 0541  CRP 24.4*    Micro Results: Recent Results (from the past 720 hour(s))  Culture, blood (routine x 2)     Status: None   Collection Time: 09/28/14  6:48 PM  Result Value Ref Range Status   Specimen Description BLOOD RIGHT ARM  Final   Special Requests BOTTLES DRAWN AEROBIC AND ANAEROBIC 5CC  Final   Culture  Setup Time   Final    09/28/2014 22:33 Performed at [facility]    Culture   Final    STAPHYLOCOCCUS AUREUS Note: SUSCEPTIBILITIES PERFORMED ON PREVIOUS CULTURE WITHIN THE LAST 5 DAYS. Note: Gram Stain Report Called to,Read Back By and Verified With: KAITLYN REID 09/29/14 @ 12:21PM BY RUSCOE A. Performed at [facility]    Report Status 10/01/2014 FINAL  Final  Culture, blood (routine x 2)     Status: None   Collection Time: 09/28/14  7:29 PM  Result Value Ref Range Status   Specimen Description BLOOD HAND RIGHT  Final   Special Requests BOTTLES DRAWN AEROBIC AND ANAEROBIC 5CC  Final   Culture  Setup Time   Final    09/29/2014 01:20 Performed at [facility]    Culture   Final    METHICILLIN RESISTANT STAPHYLOCOCCUS AUREUS Note: RIFAMPIN AND GENTAMICIN SHOULD NOT BE USED AS SINGLE DRUGS FOR TREATMENT OF STAPH INFECTIONS. CRITICAL RESULT CALLED TO, READ BACK BY AND VERIFIED WITH: RICKY OUSTERMAN 09/30/14 @ 8:17PM BY RUSCOE A. Note: Gram Stain Report Called to,Read Back By and Verified With: KAITLYN REID 09/29/14 @ 2:59PM BY RUSCOE A. Performed at [facility]    Report Status 10/01/2014 FINAL  Final   Organism ID, Bacteria METHICILLIN RESISTANT STAPHYLOCOCCUS AUREUS  Final      Susceptibility   Methicillin resistant staphylococcus aureus - MIC*    CLINDAMYCIN  >=8 RESISTANT Resistant     ERYTHROMYCIN >=8 RESISTANT Resistant     GENTAMICIN <=0.5 SENSITIVE Sensitive     LEVOFLOXACIN >=8 RESISTANT Resistant     OXACILLIN >=4 RESISTANT Resistant     PENICILLIN >=0.5 RESISTANT Resistant     RIFAMPIN <=0.5 SENSITIVE Sensitive     TRIMETH/SULFA <=10 SENSITIVE Sensitive     VANCOMYCIN 1 SENSITIVE Sensitive     TETRACYCLINE <=  1 SENSITIVE Sensitive     * METHICILLIN RESISTANT STAPHYLOCOCCUS AUREUS  MRSA PCR Screening     Status: Abnormal   Collection Time: 09/28/14 10:03 PM  Result Value Ref Range Status   MRSA by PCR POSITIVE (A) NEGATIVE Final    Comment:        The GeneXpert MRSA Assay (FDA approved for NASAL specimens only), is one component of a comprehensive MRSA colonization surveillance program. It is not intended to diagnose MRSA infection nor to guide or monitor treatment for MRSA infections. RESULT CALLED TO, READ BACK BY AND VERIFIED WITH: CALLED TO RN MICHELLE EVANGELISTA [id number] @0243  THANEY  Culture, blood (routine x 2)     Status: None (Preliminary result)   Collection Time: 09/30/14  5:33 PM  Result Value Ref Range Status   Specimen Description BLOOD RIGHT HAND  Final   Special Requests BOTTLES DRAWN AEROBIC ONLY 5CC  Final   Culture  Setup Time   Final    10/01/2014 02:24 Performed at [facility]    Culture   Final           BLOOD CULTURE RECEIVED NO GROWTH TO DATE CULTURE WILL BE HELD FOR 5 DAYS BEFORE ISSUING A FINAL NEGATIVE REPORT Performed at [facility]    Report Status PENDING  Incomplete  Culture, blood (routine x 2)     Status: None (Preliminary result)   Collection Time: 09/30/14  5:39 PM  Result Value Ref Range Status   Specimen Description BLOOD LEFT HAND  Final   Special Requests BOTTLES DRAWN AEROBIC ONLY 5CC  Final   Culture  Setup Time   Final    10/01/2014 02:24 Performed at [facility]    Culture   Final           BLOOD CULTURE RECEIVED NO GROWTH TO DATE CULTURE WILL BE  HELD FOR 5 DAYS BEFORE ISSUING A FINAL NEGATIVE REPORT Performed at [facility]    Report Status PENDING  Incomplete  Urine culture     Status: None   Collection Time: 10/01/14  3:16 AM  Result Value Ref Range Status   Specimen Description URINE, RANDOM  Final   Special Requests NONE  Final   Culture  Setup Time   Final    10/01/2014 09:41 Performed at [place]   Final    6,000 COLONIES/ML Performed at [facility]    Culture   Final    INSIGNIFICANT GROWTH Performed at [facility]    Report Status 10/02/2014 FINAL  Final    Studies/Results: Mr Hip Left Wo Contrast  10/02/2014   CLINICAL DATA:  Left hip pain radiating down the leg.  EXAM: MR OF THE LEFT HIP WITHOUT CONTRAST  TECHNIQUE: Multiplanar, multisequence MR imaging was performed. No intravenous contrast was administered.  COMPARISON:  08/15/2014  FINDINGS: Bones: Abnormal left sacroiliac joint with joint effusion inferiorly (image 5, series 6), and abnormal edema signal along the margins of the joint, especially the iliac side. Subtle edema anteriorly in the left pubic body.  Articular cartilage and labrum  Articular cartilage: Mild to moderate but symmetric degenerative articular cartilage thinning. Subcortical cyst formation laterally in the acetabulum.  Labrum:  Grossly intact  Joint or bursal effusion  Joint effusion:  Absent  Bursae:  Unremarkable  Muscles and tendons  Muscles and tendons: There is abnormal edema tracking along tissue planes adjacent to the left sacroiliac joint, including deep to the  left iliacus muscle, the sciatic notch, and adjacent left gluteal musculature. Presacral fluid noted along with edema signal in the left piriformis muscle. Low-level but symmetric edema tracks along the hip adductor musculature. Mild proximal hamstring tendinopathy on the left.  Other findings  Miscellaneous: Transitional lumbosacral vertebra with broad transverse processes  articulating with the rest of the sacrum.  IMPRESSION: 1. Abnormal edema along the left sacroiliac joint with a small SI joint effusion and edema tracking along adjacent muscular and tissue planes. Appearance concerning for septic left sacroiliac joint. 2. Bilateral degenerative arthropathy of the hips. 3. Proximal hamstring tendinopathy on the left.   Electronically Signed   By: Sherryl Barters M.D.   On: 10/02/2014 11:48      Assessment/Plan:  Principal Problem:   CRBSI (catheter-related bloodstream infection) Active Problems:   Sepsis   Line sepsis   ESRD (end stage renal disease) on dialysis   DM (diabetes mellitus)   HTN (hypertension)   SVT (supraventricular tachycardia)   Atrial fibrillation   Type 2 diabetes mellitus with diabetic chronic kidney disease   Staphylococcus aureus bacteremia with sepsis   Spinal stenosis of lumbar region   Screen for STD (sexually transmitted disease)    Noah Vang is a 58 y.o. male with  MRSA bacteremia, ESRD on HD sp removal of HD line. He has had severe back pain and MRI shows spinal analysis but no clear-cut infection transesophageal echocardiogram is pending  #1      [place] [facility] Management Team Staphylococcus aureus bacteremia   Staphylococcus aureus bacteremia (SAB) is associated with a high rate of complications and mortality.  Specific aspects of clinical management are critical to optimizing the outcome of patients with SAB.  Therefore, the [facility] Antimicrobial Management Team [facility]) has initiated an intervention aimed at improving the management of SAB at [facility]  To do so, Infectious Diseases physicians are providing an evidence-based consult for the management of all patients with SAB.     Yes No Comments  Perform follow-up blood cultures (even if the patient is afebrile) to ensure clearance of bacteremia [x]  []  Blood cultures from 09/30/14 cooking and NGTD  Remove vascular catheter and obtain follow-up  blood cultures after the removal of the catheter [x]  []  HD out   Perform echocardiography to evaluate for endocarditis (transthoracic ECHO is 40-50% sensitive, TEE is > 90% sensitive) []  []  Please keep in mind, that neither test can definitively EXCLUDE endocarditis, and that should clinical suspicion remain high for endocarditis the patient should then still be treated with an "endocarditis" duration of therapy = 6 weeks TEE PENDING  Consult electrophysiologist to evaluate implanted cardiac device (pacemaker, ICD) []  []  NA  Ensure source control [x]  []  Have all abscesses been drained effectively? Have deep seeded infections (septic joints or osteomyelitis) had appropriate surgical debridement?  Investigate for "metastatic" sites of infection []  []  Does the patient have ANY symptom or physical exam finding that would suggest a deeper infection (back or neck pain that may be suggestive of vertebral osteomyelitis or epidural abscess, muscle pain that could be a symptom of pyomyositis)?  Keep in mind that for deep seeded infections MRI imaging with contrast is preferred rather than other often insensitive tests such as plain x-rays, especially early in a patient's presentation.  MRI SPINE WITHOUT INFECTION BUT WOULD CONSIDER REPEATING IN 1 MONTH  Change antibiotic therapy to VANCOMYCIN []  []  Beta-lactam antibiotics are preferred for MSSA due to higher cure rates.   If on Vancomycin,  goal trough should be 15 - 20 mcg/mL  Estimated duration of IV antibiotic therapy:  6 WEEKS GIVEN CONCERN FOR SMOLDERING DISK INFECTION []  []  Consult case management for probably prolonged outpatient IV antibiotic therapy   #2 Screening: check HIV and Hep virus negative    LOS: 4 days   Alcide Evener 10/02/2014, 4:00 PM

## 2014-10-02 NOTE — Progress Notes (Signed)
UR completed  Retro for initial review    Brixton Franko K. Malya Cirillo, RN, BSN, MSHL, CCM  10/02/2014 4:00 PM

## 2014-10-02 NOTE — Procedures (Signed)
Successful placement of tunneled HD catheter with tips terminating within the superior aspect of the right atrium.   The patient tolerated the procedure well without immediate post procedural complication. The catheter is ready for immediate use.

## 2014-10-02 NOTE — Progress Notes (Signed)
    [facility] has been requested to perform a transesophageal echocardiogram on Noah Vang for endocarditis workup.  After careful review of history and examination, the risks and benefits of transesophageal echocardiogram have been explained including risks of esophageal damage, perforation (1:10,000 risk), bleeding, pharyngeal hematoma as well as other potential complications associated with conscious sedation including aspiration, arrhythmia, respiratory failure and death. Alternatives to treatment were discussed, questions were answered. Patient is willing to proceed.   1000hrs tomorrow  Tarri Fuller, PA-C 10/02/2014 3:50 PM

## 2014-10-02 NOTE — Progress Notes (Addendum)
ANTIBIOTIC CONSULT NOTE - FOLLOW UP  Pharmacy Consult for vancomycin, ancef Indication: MRSA bacteremia  No Known Allergies  Patient Measurements: Height: 6' (182.9 cm) Weight: 255 lb 4.8 oz (115.803 kg) (bed) IBW/kg (Calculated) : 77.6  Vital Signs: Temp: 98.2 F (36.8 C) (11/03 0221) Temp Source: Oral (11/03 0221) BP: 134/84 mmHg (11/03 0721) Pulse Rate: 91 (11/03 0721) Intake/Output from previous day: 11/02 0701 - 11/03 0700 In: 540 [P.O.:540] Out: 0  Intake/Output from this shift:    Labs:  Recent Labs  09/30/14 0147 10/01/14 0250  WBC 12.0* 9.6  HGB 9.7* 11.4*  PLT 139* 190  CREATININE 8.99* 10.73*   Estimated Creatinine Clearance: 10.1 mL/min (by C-G formula based on Cr of 10.73). No results for input(s): VANCOTROUGH, VANCOPEAK, VANCORANDOM, GENTTROUGH, GENTPEAK, GENTRANDOM, TOBRATROUGH, TOBRAPEAK, TOBRARND, AMIKACINPEAK, AMIKACINTROU, AMIKACIN in the last 72 hours.   Microbiology: Recent Results (from the past 720 hour(s))  Culture, blood (routine x 2)     Status: None   Collection Time: 09/28/14  6:48 PM  Result Value Ref Range Status   Specimen Description BLOOD RIGHT ARM  Final   Special Requests BOTTLES DRAWN AEROBIC AND ANAEROBIC 5CC  Final   Culture  Setup Time   Final    09/28/2014 22:33 Performed at [facility]    Culture   Final    STAPHYLOCOCCUS AUREUS Note: SUSCEPTIBILITIES PERFORMED ON PREVIOUS CULTURE WITHIN THE LAST 5 DAYS. Note: Gram Stain Report Called to,Read Back By and Verified With: KAITLYN REID 09/29/14 @ 12:21PM BY RUSCOE A. Performed at [facility]    Report Status 10/01/2014 FINAL  Final  Culture, blood (routine x 2)     Status: None   Collection Time: 09/28/14  7:29 PM  Result Value Ref Range Status   Specimen Description BLOOD HAND RIGHT  Final   Special Requests BOTTLES DRAWN AEROBIC AND ANAEROBIC 5CC  Final   Culture  Setup Time   Final    09/29/2014 01:20 Performed at [facility]    Culture   Final    METHICILLIN RESISTANT STAPHYLOCOCCUS AUREUS Note: RIFAMPIN AND GENTAMICIN SHOULD NOT BE USED AS SINGLE DRUGS FOR TREATMENT OF STAPH INFECTIONS. CRITICAL RESULT CALLED TO, READ BACK BY AND VERIFIED WITH: RICKY OUSTERMAN 09/30/14 @ 8:17PM BY RUSCOE A. Note: Gram Stain Report Called to,Read Back By and Verified With: KAITLYN REID 09/29/14 @ 2:59PM BY RUSCOE A. Performed at [facility]    Report Status 10/01/2014 FINAL  Final   Organism ID, Bacteria METHICILLIN RESISTANT STAPHYLOCOCCUS AUREUS  Final      Susceptibility   Methicillin resistant staphylococcus aureus - MIC*    CLINDAMYCIN >=8 RESISTANT Resistant     ERYTHROMYCIN >=8 RESISTANT Resistant     GENTAMICIN <=0.5 SENSITIVE Sensitive     LEVOFLOXACIN >=8 RESISTANT Resistant     OXACILLIN >=4 RESISTANT Resistant     PENICILLIN >=0.5 RESISTANT Resistant     RIFAMPIN <=0.5 SENSITIVE Sensitive     TRIMETH/SULFA <=10 SENSITIVE Sensitive     VANCOMYCIN 1 SENSITIVE Sensitive     TETRACYCLINE <=1 SENSITIVE Sensitive     * METHICILLIN RESISTANT STAPHYLOCOCCUS AUREUS  MRSA PCR Screening     Status: Abnormal   Collection Time: 09/28/14 10:03 PM  Result Value Ref Range Status   MRSA by PCR POSITIVE (A) NEGATIVE Final    Comment:        The GeneXpert MRSA Assay (FDA approved for NASAL specimens only), is one component of a comprehensive MRSA colonization surveillance  program. It is not intended to diagnose MRSA infection nor to guide or monitor treatment for MRSA infections. RESULT CALLED TO, READ BACK BY AND VERIFIED WITH: CALLED TO RN Sharyn Lull EVANGELISTA [id number] @0243  THANEY    Anti-infectives    Start     Dose/Rate Route Frequency Ordered Stop   10/01/14 1200  vancomycin (VANCOCIN) IVPB 1000 mg/200 mL premix  Status:  Discontinued     1,000 mg200 mL/hr over 60 Minutes Intravenous Every M-W-F (Hemodialysis) 09/30/14 1619 09/30/14 1622   10/01/14 1200  ceFAZolin (ANCEF) IVPB 2 g/50 mL premix  Status:   Discontinued     2 g100 mL/hr over 30 Minutes Intravenous Every M-W-F (Hemodialysis) 09/30/14 1619 09/30/14 1622   09/30/14 1700  ceFAZolin (ANCEF) IVPB 2 g/50 mL premix     2 g100 mL/hr over 30 Minutes Intravenous  Once 09/30/14 1619 09/30/14 1811   09/28/14 2000  piperacillin-tazobactam (ZOSYN) IVPB 2.25 g  Status:  Discontinued     2.25 g100 mL/hr over 30 Minutes Intravenous Every 8 hours 09/28/14 1905 09/30/14 1610   09/28/14 1915  vancomycin (VANCOCIN) 2,000 mg in sodium chloride 0.9 % 500 mL IVPB     2,000 mg250 mL/hr over 120 Minutes Intravenous  Once 09/28/14 1905 09/28/14 2208      Assessment: 58 yo male with MRSA bacteremia on vancomycin and ancef. Patient with ESRD and noted for HD today after new cath placement.  10/30 Vancomycin>> 10/30 Zosyn >> 11/1 11/1 Cefazolin>> 11/1  10/30 blood- MRSA  Goal of Therapy:  Pre-HD vancomycin level: 15-25  Plan:  -Vancomycin 1000mg  IV HD today -Will follow HD plans and dose vancomycin based on HD plans  Hildred Laser, Pharm D 10/02/2014 8:28 AM

## 2014-10-02 NOTE — Progress Notes (Signed)
UR completed - concurrent.   Chrisanna Mishra K. Zebulan Hinshaw, RN, BSN, MSHL, CCM  10/02/2014 4:00 PM

## 2014-10-02 NOTE — Progress Notes (Signed)
[facility] Progress Note   Subjective: Still c/o severe pain L leg, primarily L hip. Starts in hip and goes down to my foot.  No numbness, can't move leg due to pain. Fevers better.   Filed Vitals:   10/01/14 1704 10/01/14 1912 10/02/14 0221 10/02/14 0721  BP: 167/94 158/94 157/90 134/84  Pulse: 100 99 98 91  Temp: 98.1 F (36.7 C) 98.6 F (37 C) 98.2 F (36.8 C)   TempSrc: Oral Oral Oral   Resp: 19 20 18 17   Height:  6' (1.829 m)    Weight:  115.985 kg (255 lb 11.2 oz)  115.803 kg (255 lb 4.8 oz)  SpO2: 99% 97% 94% 95%   Exam: GEN: NAD, appears fatigued CV: tachy, regular, no murmur or rub PULM: Clear on anterior auscultation ABD: Soft nontender, obese EXT: L hip point tender, limited ROM left leg d/t pain, no LE edema. No knee of ankle joint effusion NEURO: unable to move L leg d/t pain, R leg no problems, Ox 3  HD: MWF [place] [phone]) 4h  F200   262 lb dry wt (119kg)  350/800  Heparin 4800 > 500 u/hr       Assessment: 1 Catheter-related bacteremia / MRSA - IV vanc/zosyn 10/30, TTE neg. Cath removed 10/31  2 Left hip pain - persistent w dec'd mobility 3 ESRD on HD, last HD Friday 10/20 4 HTN/vol - on 2 BP meds here, 4 at home. BP upper limits of normal, below dry wt -3kg 5 Anemia - Hb stable, no esa 6 HPTH cont renvela ac 7 Afib w RVR - on MTP 12.5 bid 8 DM per primary  Plan- HD today after cath placement by IR, also will get MRI left hip eval for fluid collection/ infected joint. Have adjusted BP meds to maintain prior home meds    Kelly Splinter MD  pager [phone]    cell [phone]  10/02/2014, 9:40 AM     Recent Labs Lab 09/29/14 0326 09/30/14 0147 10/01/14 0250  NA 137 134* 135*  K 4.8 4.4 4.6  CL 96 95* 93*  CO2 23 21 23   GLUCOSE 156* 159* 129*  BUN 39* 54* 64*  CREATININE 7.34* 8.99* 10.73*  CALCIUM 9.1 8.9 8.9    Recent Labs Lab 09/28/14 1817  AST 31  ALT 24  ALKPHOS 129*  BILITOT 0.7  PROT 8.3   ALBUMIN 3.2*    Recent Labs Lab 09/28/14 1817 09/29/14 0326 09/30/14 0147 10/01/14 0250  WBC 16.9* 13.9* 12.0* 9.6  NEUTROABS 14.6*  --   --   --   HGB 11.4* 10.2* 9.7* 11.4*  HCT 34.3* 31.5* 29.6* 36.0*  MCV 83.5 82.5 80.9 80.9  PLT 139* 144* 139* 190   . aspirin  325 mg Oral Daily  . Chlorhexidine Gluconate Cloth  6 each Topical Q0600  . doxazosin  4 mg Oral QHS  . famotidine  20 mg Oral Daily  . gabapentin  100 mg Oral TID  . heparin  5,000 Units Subcutaneous 3 times per day  . Influenza vac split quadrivalent PF  0.5 mL Intramuscular Tomorrow-1000  . insulin aspart  0-20 Units Subcutaneous TID WC  . insulin aspart  0-5 Units Subcutaneous QHS  . insulin glargine  13 Units Subcutaneous Daily  . metoprolol tartrate  25 mg Oral BID  . mupirocin ointment  1 application Nasal BID  . sevelamer carbonate  1,600 mg Oral TID WC  . sodium chloride  3 mL Intravenous  Q12H  . [START ON 10/04/2014] Vitamin D (Ergocalciferol)  50,000 Units Oral Q7 days     acetaminophen **OR** acetaminophen, oxyCODONE

## 2014-10-02 NOTE — Sedation Documentation (Signed)
Patient denies pain and is resting comfortably.  

## 2014-10-03 ENCOUNTER — Encounter (HOSPITAL_COMMUNITY): Payer: Self-pay

## 2014-10-03 ENCOUNTER — Encounter (HOSPITAL_COMMUNITY)
Admission: EM | Disposition: A | Discharge status: Home Health | Payer: Self-pay | Source: Home / Self Care | Attending: Internal Medicine

## 2014-10-03 DIAGNOSIS — M4658 Other infective spondylopathies, sacral and sacrococcygeal region: Secondary | ICD-10-CM | POA: Insufficient documentation

## 2014-10-03 DIAGNOSIS — R29898 Other symptoms and signs involving the musculoskeletal system: Secondary | ICD-10-CM | POA: Insufficient documentation

## 2014-10-03 DIAGNOSIS — I351 Nonrheumatic aortic (valve) insufficiency: Secondary | ICD-10-CM

## 2014-10-03 HISTORY — PX: TEE WITHOUT CARDIOVERSION: SHX5443

## 2014-10-03 LAB — CBC
HCT: 29.8 % — ABNORMAL LOW (ref 39.0–52.0)
HEMOGLOBIN: 9.6 g/dL — AB (ref 13.0–17.0)
MCH: 26.4 pg (ref 26.0–34.0)
MCHC: 32.2 g/dL (ref 30.0–36.0)
MCV: 82.1 fL (ref 78.0–100.0)
Platelets: 304 10*3/uL (ref 150–400)
RBC: 3.63 MIL/uL — AB (ref 4.22–5.81)
RDW: 17.9 % — ABNORMAL HIGH (ref 11.5–15.5)
WBC: 9.9 10*3/uL (ref 4.0–10.5)

## 2014-10-03 LAB — BASIC METABOLIC PANEL
Anion gap: 16 — ABNORMAL HIGH (ref 5–15)
BUN: 33 mg/dL — ABNORMAL HIGH (ref 6–23)
CHLORIDE: 96 meq/L (ref 96–112)
CO2: 23 meq/L (ref 19–32)
Calcium: 8.8 mg/dL (ref 8.4–10.5)
Creatinine, Ser: 6.94 mg/dL — ABNORMAL HIGH (ref 0.50–1.35)
GFR calc Af Amer: 9 mL/min — ABNORMAL LOW (ref >90)
GFR calc non Af Amer: 8 mL/min — ABNORMAL LOW (ref >90)
Glucose, Bld: 110 mg/dL — ABNORMAL HIGH (ref 70–99)
Potassium: 4.3 mEq/L (ref 3.7–5.3)
SODIUM: 135 meq/L — AB (ref 137–147)

## 2014-10-03 LAB — GLUCOSE, CAPILLARY
Glucose-Capillary: 100 mg/dL — ABNORMAL HIGH (ref 70–99)
Glucose-Capillary: 71 mg/dL (ref 70–99)
Glucose-Capillary: 74 mg/dL (ref 70–99)

## 2014-10-03 SURGERY — ECHOCARDIOGRAM, TRANSESOPHAGEAL
Anesthesia: Moderate Sedation

## 2014-10-03 MED ORDER — VANCOMYCIN HCL IN DEXTROSE 1-5 GM/200ML-% IV SOLN
1000.0000 mg | INTRAVENOUS | Status: DC
  Administered 2014-10-05: 1000 mg via INTRAVENOUS
  Filled 2014-10-03: qty 200

## 2014-10-03 MED ORDER — MIDAZOLAM HCL 10 MG/2ML IJ SOLN
INTRAMUSCULAR | Status: DC | PRN
  Administered 2014-10-03 (×2): 2 mg via INTRAVENOUS

## 2014-10-03 MED ORDER — FENTANYL CITRATE 0.05 MG/ML IJ SOLN
INTRAMUSCULAR | Status: DC | PRN
  Administered 2014-10-03 (×2): 25 ug via INTRAVENOUS

## 2014-10-03 MED ORDER — HYDRALAZINE HCL 20 MG/ML IJ SOLN
INTRAMUSCULAR | Status: DC | PRN
  Administered 2014-10-03: 10 mg via INTRAVENOUS

## 2014-10-03 MED ORDER — VANCOMYCIN HCL IN DEXTROSE 1-5 GM/200ML-% IV SOLN
1000.0000 mg | Freq: Once | INTRAVENOUS | Status: AC
  Administered 2014-10-04: 1000 mg via INTRAVENOUS
  Filled 2014-10-03: qty 200

## 2014-10-03 MED ORDER — METOPROLOL TARTRATE 1 MG/ML IV SOLN
INTRAVENOUS | Status: DC | PRN
  Administered 2014-10-03: 5 mg via INTRAVENOUS

## 2014-10-03 NOTE — Interval H&P Note (Signed)
History and Physical Interval Note:  10/03/2014 10:07 AM  Noah Vang  has presented today for surgery, with the diagnosis of MRSA  The various methods of treatment have been discussed with the patient and family. After consideration of risks, benefits and other options for treatment, the patient has consented to  Procedure(s): TRANSESOPHAGEAL ECHOCARDIOGRAM (TEE) (N/A) as a surgical intervention .  The patient's history has been reviewed, patient examined, no change in status, stable for surgery.  I have reviewed the patient's chart and labs.  Questions were answered to the patient's satisfaction.     Darden Amber.

## 2014-10-03 NOTE — Consult Note (Signed)
ORTHOPAEDIC CONSULTATION  REQUESTING PHYSICIAN: Geradine Girt, DO  Chief Complaint: r/o left septic SI joint  HPI: Noah Vang is a 58 y.o. male who is admitted for MRSA bacteremia. C/o left posterior hip/buttock pain for 2 weeks.  Denies any trauma or recent procedures.  MRI shows left SI joint fluid and surrounding muscular edema w/o discrete abscess.  Ortho consulted.  Past Medical History  Diagnosis Date  . Diabetes mellitus without complication   . Renal disorder   . Dialysis patient   . Hypertension    History reviewed. No pertinent past surgical history. History   Social History  . Marital Status: Unknown    Spouse Name: N/A    Number of Children: N/A  . Years of Education: N/A   Social History Main Topics  . Smoking status: Never Smoker   . Smokeless tobacco: None  . Alcohol Use: No  . Drug Use: None  . Sexual Activity: None   Other Topics Concern  . None   Social History Narrative   History reviewed. No pertinent family history. No Known Allergies Prior to Admission medications   Medication Sig Start Date End Date Taking? Authorizing Provider  cloNIDine (CATAPRES) 0.3 MG tablet Take 0.3 mg by mouth every 8 (eight) hours.   Yes Historical Provider, MD  doxazosin (CARDURA) 4 MG tablet Take 4 mg by mouth at bedtime.   Yes Historical Provider, MD  famotidine (PEPCID) 20 MG tablet Take 20 mg by mouth daily.   Yes Historical Provider, MD  gabapentin (NEURONTIN) 100 MG capsule Take 100 mg by mouth 3 (three) times daily.   Yes Historical Provider, MD  insulin glargine (LANTUS) 100 UNIT/ML injection Inject 13 Units into the skin daily.   Yes Historical Provider, MD  labetalol (NORMODYNE) 300 MG tablet Take 300 mg by mouth 3 (three) times daily.   Yes Historical Provider, MD  lisinopril (PRINIVIL,ZESTRIL) 40 MG tablet Take 40 mg by mouth daily.   Yes Historical Provider, MD  sevelamer carbonate (RENVELA) 800 MG tablet Take 1,600 mg by mouth 3 (three) times  daily with meals.   Yes Historical Provider, MD  torsemide (DEMADEX) 20 MG tablet Take 40 mg by mouth daily.   Yes Historical Provider, MD  Vitamin D, Ergocalciferol, (DRISDOL) 50000 UNITS CAPS capsule Take 50,000 Units by mouth every 7 (seven) days. On Thursday   Yes Historical Provider, MD   Mr Hip Left Wo Contrast  10/02/2014   CLINICAL DATA:  Left hip pain radiating down the leg.  EXAM: MR OF THE LEFT HIP WITHOUT CONTRAST  TECHNIQUE: Multiplanar, multisequence MR imaging was performed. No intravenous contrast was administered.  COMPARISON:  08/15/2014  FINDINGS: Bones: Abnormal left sacroiliac joint with joint effusion inferiorly (image 5, series 6), and abnormal edema signal along the margins of the joint, especially the iliac side. Subtle edema anteriorly in the left pubic body.  Articular cartilage and labrum  Articular cartilage: Mild to moderate but symmetric degenerative articular cartilage thinning. Subcortical cyst formation laterally in the acetabulum.  Labrum:  Grossly intact  Joint or bursal effusion  Joint effusion:  Absent  Bursae:  Unremarkable  Muscles and tendons  Muscles and tendons: There is abnormal edema tracking along tissue planes adjacent to the left sacroiliac joint, including deep to the left iliacus muscle, the sciatic notch, and adjacent left gluteal musculature. Presacral fluid noted along with edema signal in the left piriformis muscle. Low-level but symmetric edema tracks along the hip adductor musculature. Mild proximal hamstring  tendinopathy on the left.  Other findings  Miscellaneous: Transitional lumbosacral vertebra with broad transverse processes articulating with the rest of the sacrum.  IMPRESSION: 1. Abnormal edema along the left sacroiliac joint with a small SI joint effusion and edema tracking along adjacent muscular and tissue planes. Appearance concerning for septic left sacroiliac joint. 2. Bilateral degenerative arthropathy of the hips. 3. Proximal hamstring  tendinopathy on the left.   Electronically Signed   By: Sherryl Barters M.D.   On: 10/02/2014 11:48   Ir Fluoro Guide Cv Line Left  10/02/2014   INDICATION: History of right internal jugular approach tunneled dialysis catheter placement on 08/21/2014, admitted with bacteremia with subsequent removal of the dialysis catheter on 09/29/2014. Patient has remained and antibiotics with normalization of white blood cell count and has remained afebrile for the past 48 hours. Request made for placement of a new tunneled dialysis catheter for continuation of dialysis.  EXAM: TUNNELED CENTRAL VENOUS HEMODIALYSIS CATHETER PLACEMENT WITH ULTRASOUND AND FLUOROSCOPIC GUIDANCE  MEDICATIONS: Ancef 2 gm IV; The IV antibiotic was given in an appropriate time interval prior to skin puncture.  CONTRAST:  None  ANESTHESIA/SEDATION: Versed 1 mg IV; Fentanyl 50 mcg IV  Total Moderate Sedation Time  11 minutes.  FLUOROSCOPY TIME:  54 seconds.  COMPLICATIONS: None immediate  PROCEDURE: Informed written consent was obtained from the patient after a discussion of the risks, benefits, and alternatives to treatment. Questions regarding the procedure were encouraged and answered.  Given concern for recent infection involving the right internal jugular approach dialysis catheter, the decision was made to place a left internal jugular approach dialysis catheter. As such, the left neck and chest were prepped with chlorhexidine in a sterile fashion, and a sterile drape was applied covering the operative field. Maximum barrier sterile technique with sterile gowns and gloves were used for the procedure. A timeout was performed prior to the initiation of the procedure.  After creating a small venotomy incision, a micropuncture kit was utilized to access the left internal jugular vein under direct, real-time ultrasound guidance after the overlying soft tissues were anesthetized with 1% lidocaine with epinephrine. Ultrasound image documentation was  performed. The microwire was kinked to measure appropriate catheter length. A stiff Glidewire was advanced to the level of the IVC and the micropuncture sheath was exchanged for a peel-away sheath. A Palindrome tunneled hemodialysis catheter measuring 23 cm from tip to cuff was tunneled in a retrograde fashion from the anterior chest wall to the venotomy incision.  The catheter was then placed through the peel-away sheath with tips ultimately positioned within the superior aspect of the right atrium. Final catheter positioning was confirmed and documented with a spot radiographic image. The catheter aspirates and flushes normally. The catheter was flushed with appropriate volume heparin dwells.  The catheter exit site was secured with a 0-Prolene retention suture. The venotomy incision was closed with an interrupted 4-0 Vicryl, Dermabond and Steri-strips. Dressings were applied. The patient tolerated the procedure well without immediate post procedural complication.  IMPRESSION: Successful placement of 23 cm tip to cuff tunneled hemodialysis catheter via the left internal jugular vein with tips terminating within the superior aspect of the right atrium. The catheter is ready for immediate use.   Electronically Signed   By: Sandi Mariscal M.D.   On: 10/02/2014 16:07   Ir US Guide Vasc Access Left  10/02/2014   INDICATION: History of right internal jugular approach tunneled dialysis catheter placement on 08/21/2014, admitted with bacteremia with subsequent removal  of the dialysis catheter on 09/29/2014. Patient has remained and antibiotics with normalization of white blood cell count and has remained afebrile for the past 48 hours. Request made for placement of a new tunneled dialysis catheter for continuation of dialysis.  EXAM: TUNNELED CENTRAL VENOUS HEMODIALYSIS CATHETER PLACEMENT WITH ULTRASOUND AND FLUOROSCOPIC GUIDANCE  MEDICATIONS: Ancef 2 gm IV; The IV antibiotic was given in an appropriate time interval  prior to skin puncture.  CONTRAST:  None  ANESTHESIA/SEDATION: Versed 1 mg IV; Fentanyl 50 mcg IV  Total Moderate Sedation Time  11 minutes.  FLUOROSCOPY TIME:  54 seconds.  COMPLICATIONS: None immediate  PROCEDURE: Informed written consent was obtained from the patient after a discussion of the risks, benefits, and alternatives to treatment. Questions regarding the procedure were encouraged and answered.  Given concern for recent infection involving the right internal jugular approach dialysis catheter, the decision was made to place a left internal jugular approach dialysis catheter. As such, the left neck and chest were prepped with chlorhexidine in a sterile fashion, and a sterile drape was applied covering the operative field. Maximum barrier sterile technique with sterile gowns and gloves were used for the procedure. A timeout was performed prior to the initiation of the procedure.  After creating a small venotomy incision, a micropuncture kit was utilized to access the left internal jugular vein under direct, real-time ultrasound guidance after the overlying soft tissues were anesthetized with 1% lidocaine with epinephrine. Ultrasound image documentation was performed. The microwire was kinked to measure appropriate catheter length. A stiff Glidewire was advanced to the level of the IVC and the micropuncture sheath was exchanged for a peel-away sheath. A Palindrome tunneled hemodialysis catheter measuring 23 cm from tip to cuff was tunneled in a retrograde fashion from the anterior chest wall to the venotomy incision.  The catheter was then placed through the peel-away sheath with tips ultimately positioned within the superior aspect of the right atrium. Final catheter positioning was confirmed and documented with a spot radiographic image. The catheter aspirates and flushes normally. The catheter was flushed with appropriate volume heparin dwells.  The catheter exit site was secured with a 0-Prolene  retention suture. The venotomy incision was closed with an interrupted 4-0 Vicryl, Dermabond and Steri-strips. Dressings were applied. The patient tolerated the procedure well without immediate post procedural complication.  IMPRESSION: Successful placement of 23 cm tip to cuff tunneled hemodialysis catheter via the left internal jugular vein with tips terminating within the superior aspect of the right atrium. The catheter is ready for immediate use.   Electronically Signed   By: Sandi Mariscal M.D.   On: 10/02/2014 16:07    Positive ROS: All other systems have been reviewed and were otherwise negative with the exception of those mentioned in the HPI and as above.  Physical Exam: General: Alert, no acute distress Cardiovascular: No pedal edema Respiratory: No cyanosis, no use of accessory musculature GI: No organomegaly, abdomen is soft and non-tender Skin: No lesions in the area of chief complaint Neurologic: Sensation intact distally Psychiatric: Patient is competent for consent with normal mood and affect Lymphatic: No axillary or cervical lymphadenopathy  MUSCULOSKELETAL:  - no skin wounds over left SI joint - SI joint is TTP - no cellulitis, fluctuance, induration - stage 1 sacral decub  Assessment: Left SI joint effusion, myositis  Plan: - recommend IR aspiration of fluid and analysis - fluid needs to be sent off for 1. Cell count with crystals; 2. Gram stain; 3. Bacterial cultures -  unlikely to need surgery if SI joint is indeed infected and will require long term abx - will continue to follow  Thank you for the consult and the opportunity to see Noah Vang. Eduard Roux, MD [place] 11:24 AM

## 2014-10-03 NOTE — Progress Notes (Signed)
PROGRESS NOTE  Noah Vang ZJI:[id number] DOB: 10/11/1956 DOA: 09/28/2014 PCP: No PCP Per Patient  Noah Vang is a 58 y.o. male sent to the ED from dialysis with tachycardia, fever, chills, hypertension. Symptoms onset during dialysis today. There is associated tenderness, swelling, and warmth at his RIJ tunneled catheter which is his dialysis access site. He states they still finished dialysis today (although he is still fluid overloaded on CXR and hypertensive). Patient reports his access site which has been in for the past 1 month, became tender, swollen, etc 1 week ago.  Patient grew MRSA bacteremia- catheter removed; new access placed 11/3- HD done- needs TEE.  MRI showed possible septic joint  Assessment/Plan: Sepsis due to MRSA bacteremia- most likely source is his RIJ tunneled dialysis catheter  BCx + 2/2 -repeated 11/1- NGTD Vanc/cefazolin - TEE  Nephrology consult- for dialysis today IR pulled infected catheter 10/31  -MRI of spine shows:  Moderate to severe spinal stenosis at L4-5 with grade 1 anterior slip of L4 and L5. L5 nerve root impingement          bilaterally -- follow up outpatient- PRN pain meds -MRI hip :Abnormal edema along the left sacroiliac joint with a small SI joint effusion and edema tracking along adjacent muscular and tissue planes. Appearance concerning for septic left sacroiliac joint--- ortho, Xu consulted  HTN - BB  ESRD - nephrology consulted-  access placed 11/3  DM - continue home lantus, CBG checks AC/HS        Paroxysmal Atrial fib - control with BB, added ASA- back in sinus- discussed further anticoagulation with patient- declined for now        Increased troponins- from rapid heart rate, sepsis- decreasing   Code Status: full Family Communication: patient Disposition Plan:    Consultants:  IR  Nephrology  ID  Cards (tee)  Ortho Erlinda Hong)  Procedures:  IR- catheter pulled  TEE    HPI/Subjective: C/o hip pain  still No SOB, no CP   Objective: Filed Vitals:   10/03/14 0737  BP: 158/94  Pulse: 108  Temp: 98.3 F (36.8 C)  Resp: 18    Intake/Output Summary (Last 24 hours) at 10/03/14 0859 Last data filed at 10/03/14 0600  Gross per 24 hour  Intake    240 ml  Output      0 ml  Net    240 ml   Filed Weights   10/02/14 1628 10/02/14 2042 10/03/14 0737  Weight: 116.9 kg (257 lb 11.5 oz) 117 kg (257 lb 15 oz) 114.3 kg (251 lb 15.8 oz)    Exam:   General:  A+Ox3, NAD  Cardiovascular: rrr  Respiratory: clear  Abdomen: +BS, soft  Musculoskeletal: min LE edema   Data Reviewed: Basic Metabolic Panel:  Recent Labs Lab 09/29/14 0326 09/30/14 0147 10/01/14 0250 10/02/14 1643 10/03/14 0121  NA 137 134* 135* 134* 135*  K 4.8 4.4 4.6 5.0 4.3  CL 96 95* 93* 92* 96  CO2 _0 GLUCOSE 156* 159* 129* 126* 110*  BUN 39* 54* 64* 78* 33*  CREATININE 7.34* 8.99* 10.73* 13.23* 6.94*  CALCIUM 9.1 8.9 8.9 8.7 8.8  PHOS  --   --   --  6.2*  --    Liver Function Tests:  Recent Labs Lab 09/28/14 1817 10/02/14 1643  AST 31  --   ALT 24  --   ALKPHOS 129*  --   BILITOT 0.7  --   PROT  8.3  --   ALBUMIN 3.2* 2.2*   No results for input(s): LIPASE, AMYLASE in the last 168 hours. No results for input(s): AMMONIA in the last 168 hours. CBC:  Recent Labs Lab 09/28/14 1817 09/29/14 0326 09/30/14 0147 10/01/14 0250 10/02/14 1615 10/03/14 0121  WBC 16.9* 13.9* 12.0* 9.6 11.4* 9.9  NEUTROABS 14.6*  --   --   --   --   --   HGB 11.4* 10.2* 9.7* 11.4* 9.1* 9.6*  HCT 34.3* 31.5* 29.6* 36.0* 27.7* 29.8*  MCV 83.5 82.5 80.9 80.9 81.0 82.1  PLT 139* 144* 139* 190 348 304   Cardiac Enzymes:  Recent Labs Lab 09/30/14 1358 09/30/14 1931 10/01/14 0250 10/01/14 0856 10/01/14 1337  TROPONINI <0.30 <0.30 <0.30 <0.30 <0.30   BNP (last 3 results) No results for input(s): PROBNP in the last 8760 hours. CBG:  Recent Labs Lab 10/01/14 1703 10/01/14 2158  10/02/14 0653 10/02/14 1157 10/02/14 2233  GLUCAP 153* 111* 110* 122* 109*    Recent Results (from the past 240 hour(s))  Culture, blood (routine x 2)     Status: None   Collection Time: 09/28/14  6:48 PM  Result Value Ref Range Status   Specimen Description BLOOD RIGHT ARM  Final   Special Requests BOTTLES DRAWN AEROBIC AND ANAEROBIC 5CC  Final   Culture  Setup Time   Final    09/28/2014 22:33 Performed at [facility]    Culture   Final    STAPHYLOCOCCUS AUREUS Note: SUSCEPTIBILITIES PERFORMED ON PREVIOUS CULTURE WITHIN THE LAST 5 DAYS. Note: Gram Stain Report Called to,Read Back By and Verified With: KAITLYN REID 09/29/14 @ 12:21PM BY RUSCOE A. Performed at [facility]    Report Status 10/01/2014 FINAL  Final  Culture, blood (routine x 2)     Status: None   Collection Time: 09/28/14  7:29 PM  Result Value Ref Range Status   Specimen Description BLOOD HAND RIGHT  Final   Special Requests BOTTLES DRAWN AEROBIC AND ANAEROBIC 5CC  Final   Culture  Setup Time   Final    09/29/2014 01:20 Performed at [facility]    Culture   Final    METHICILLIN RESISTANT STAPHYLOCOCCUS AUREUS Note: RIFAMPIN AND GENTAMICIN SHOULD NOT BE USED AS SINGLE DRUGS FOR TREATMENT OF STAPH INFECTIONS. CRITICAL RESULT CALLED TO, READ BACK BY AND VERIFIED WITH: RICKY OUSTERMAN 09/30/14 @ 8:17PM BY RUSCOE A. Note: Gram Stain Report Called to,Read Back By and Verified With: KAITLYN REID 09/29/14 @ 2:59PM BY RUSCOE A. Performed at [facility]    Report Status 10/01/2014 FINAL  Final   Organism ID, Bacteria METHICILLIN RESISTANT STAPHYLOCOCCUS AUREUS  Final      Susceptibility   Methicillin resistant staphylococcus aureus - MIC*    CLINDAMYCIN >=8 RESISTANT Resistant     ERYTHROMYCIN >=8 RESISTANT Resistant     GENTAMICIN <=0.5 SENSITIVE Sensitive     LEVOFLOXACIN >=8 RESISTANT Resistant     OXACILLIN >=4 RESISTANT Resistant     PENICILLIN >=0.5 RESISTANT Resistant      RIFAMPIN <=0.5 SENSITIVE Sensitive     TRIMETH/SULFA <=10 SENSITIVE Sensitive     VANCOMYCIN 1 SENSITIVE Sensitive     TETRACYCLINE <=1 SENSITIVE Sensitive     * METHICILLIN RESISTANT STAPHYLOCOCCUS AUREUS  MRSA PCR Screening     Status: Abnormal   Collection Time: 09/28/14 10:03 PM  Result Value Ref Range Status   MRSA by PCR POSITIVE (A) NEGATIVE Final    Comment:  The GeneXpert MRSA Assay (FDA approved for NASAL specimens only), is one component of a comprehensive MRSA colonization surveillance program. It is not intended to diagnose MRSA infection nor to guide or monitor treatment for MRSA infections. RESULT CALLED TO, READ BACK BY AND VERIFIED WITH: CALLED TO RN MICHELLE EVANGELISTA [id number]  THANEY  Culture, blood (routine x 2)     Status: None (Preliminary result)   Collection Time: 09/30/14  5:33 PM  Result Value Ref Range Status   Specimen Description BLOOD RIGHT HAND  Final   Special Requests BOTTLES DRAWN AEROBIC ONLY 5CC  Final   Culture  Setup Time   Final    10/01/2014 02:24 Performed at [facility]    Culture   Final           BLOOD CULTURE RECEIVED NO GROWTH TO DATE CULTURE WILL BE HELD FOR 5 DAYS BEFORE ISSUING A FINAL NEGATIVE REPORT Performed at [facility]    Report Status PENDING  Incomplete  Culture, blood (routine x 2)     Status: None (Preliminary result)   Collection Time: 09/30/14  5:39 PM  Result Value Ref Range Status   Specimen Description BLOOD LEFT HAND  Final   Special Requests BOTTLES DRAWN AEROBIC ONLY 5CC  Final   Culture  Setup Time   Final    10/01/2014 02:24 Performed at [facility]    Culture   Final           BLOOD CULTURE RECEIVED NO GROWTH TO DATE CULTURE WILL BE HELD FOR 5 DAYS BEFORE ISSUING A FINAL NEGATIVE REPORT Performed at [facility]    Report Status PENDING  Incomplete  Urine culture     Status: None   Collection Time: 10/01/14  3:16 AM  Result Value Ref Range Status    Specimen Description URINE, RANDOM  Final   Special Requests NONE  Final   Culture  Setup Time   Final    10/01/2014 09:41 Performed at [place]   Final    6,000 COLONIES/ML Performed at [facility]    Culture   Final    INSIGNIFICANT GROWTH Performed at [facility]    Report Status 10/02/2014 FINAL  Final     Studies: Mr Hip Left Wo Contrast  10/02/2014   CLINICAL DATA:  Left hip pain radiating down the leg.  EXAM: MR OF THE LEFT HIP WITHOUT CONTRAST  TECHNIQUE: Multiplanar, multisequence MR imaging was performed. No intravenous contrast was administered.  COMPARISON:  08/15/2014  FINDINGS: Bones: Abnormal left sacroiliac joint with joint effusion inferiorly (image 5, series 6), and abnormal edema signal along the margins of the joint, especially the iliac side. Subtle edema anteriorly in the left pubic body.  Articular cartilage and labrum  Articular cartilage: Mild to moderate but symmetric degenerative articular cartilage thinning. Subcortical cyst formation laterally in the acetabulum.  Labrum:  Grossly intact  Joint or bursal effusion  Joint effusion:  Absent  Bursae:  Unremarkable  Muscles and tendons  Muscles and tendons: There is abnormal edema tracking along tissue planes adjacent to the left sacroiliac joint, including deep to the left iliacus muscle, the sciatic notch, and adjacent left gluteal musculature. Presacral fluid noted along with edema signal in the left piriformis muscle. Low-level but symmetric edema tracks along the hip adductor musculature. Mild proximal hamstring tendinopathy on the left.  Other findings  Miscellaneous: Transitional lumbosacral vertebra with broad transverse processes articulating with  the rest of the sacrum.  IMPRESSION: 1. Abnormal edema along the left sacroiliac joint with a small SI joint effusion and edema tracking along adjacent muscular and tissue planes. Appearance concerning for septic left  sacroiliac joint. 2. Bilateral degenerative arthropathy of the hips. 3. Proximal hamstring tendinopathy on the left.   Electronically Signed   By: Sherryl Barters M.D.   On: 10/02/2014 11:48   Ir Fluoro Guide Cv Line Left  10/02/2014   INDICATION: History of right internal jugular approach tunneled dialysis catheter placement on 08/21/2014, admitted with bacteremia with subsequent removal of the dialysis catheter on 09/29/2014. Patient has remained and antibiotics with normalization of white blood cell count and has remained afebrile for the past 48 hours. Request made for placement of a new tunneled dialysis catheter for continuation of dialysis.  EXAM: TUNNELED CENTRAL VENOUS HEMODIALYSIS CATHETER PLACEMENT WITH ULTRASOUND AND FLUOROSCOPIC GUIDANCE  MEDICATIONS: Ancef 2 gm IV; The IV antibiotic was given in an appropriate time interval prior to skin puncture.  CONTRAST:  None  ANESTHESIA/SEDATION: Versed 1 mg IV; Fentanyl 50 mcg IV  Total Moderate Sedation Time  11 minutes.  FLUOROSCOPY TIME:  54 seconds.  COMPLICATIONS: None immediate  PROCEDURE: Informed written consent was obtained from the patient after a discussion of the risks, benefits, and alternatives to treatment. Questions regarding the procedure were encouraged and answered.  Given concern for recent infection involving the right internal jugular approach dialysis catheter, the decision was made to place a left internal jugular approach dialysis catheter. As such, the left neck and chest were prepped with chlorhexidine in a sterile fashion, and a sterile drape was applied covering the operative field. Maximum barrier sterile technique with sterile gowns and gloves were used for the procedure. A timeout was performed prior to the initiation of the procedure.  After creating a small venotomy incision, a micropuncture kit was utilized to access the left internal jugular vein under direct, real-time ultrasound guidance after the overlying soft tissues  were anesthetized with 1% lidocaine with epinephrine. Ultrasound image documentation was performed. The microwire was kinked to measure appropriate catheter length. A stiff Glidewire was advanced to the level of the IVC and the micropuncture sheath was exchanged for a peel-away sheath. A Palindrome tunneled hemodialysis catheter measuring 23 cm from tip to cuff was tunneled in a retrograde fashion from the anterior chest wall to the venotomy incision.  The catheter was then placed through the peel-away sheath with tips ultimately positioned within the superior aspect of the right atrium. Final catheter positioning was confirmed and documented with a spot radiographic image. The catheter aspirates and flushes normally. The catheter was flushed with appropriate volume heparin dwells.  The catheter exit site was secured with a 0-Prolene retention suture. The venotomy incision was closed with an interrupted 4-0 Vicryl, Dermabond and Steri-strips. Dressings were applied. The patient tolerated the procedure well without immediate post procedural complication.  IMPRESSION: Successful placement of 23 cm tip to cuff tunneled hemodialysis catheter via the left internal jugular vein with tips terminating within the superior aspect of the right atrium. The catheter is ready for immediate use.   Electronically Signed   By: Sandi Mariscal M.D.   On: 10/02/2014 16:07   Ir US Guide Vasc Access Left  10/02/2014   INDICATION: History of right internal jugular approach tunneled dialysis catheter placement on 08/21/2014, admitted with bacteremia with subsequent removal of the dialysis catheter on 09/29/2014. Patient has remained and antibiotics with normalization of white blood cell count  and has remained afebrile for the past 48 hours. Request made for placement of a new tunneled dialysis catheter for continuation of dialysis.  EXAM: TUNNELED CENTRAL VENOUS HEMODIALYSIS CATHETER PLACEMENT WITH ULTRASOUND AND FLUOROSCOPIC GUIDANCE   MEDICATIONS: Ancef 2 gm IV; The IV antibiotic was given in an appropriate time interval prior to skin puncture.  CONTRAST:  None  ANESTHESIA/SEDATION: Versed 1 mg IV; Fentanyl 50 mcg IV  Total Moderate Sedation Time  11 minutes.  FLUOROSCOPY TIME:  54 seconds.  COMPLICATIONS: None immediate  PROCEDURE: Informed written consent was obtained from the patient after a discussion of the risks, benefits, and alternatives to treatment. Questions regarding the procedure were encouraged and answered.  Given concern for recent infection involving the right internal jugular approach dialysis catheter, the decision was made to place a left internal jugular approach dialysis catheter. As such, the left neck and chest were prepped with chlorhexidine in a sterile fashion, and a sterile drape was applied covering the operative field. Maximum barrier sterile technique with sterile gowns and gloves were used for the procedure. A timeout was performed prior to the initiation of the procedure.  After creating a small venotomy incision, a micropuncture kit was utilized to access the left internal jugular vein under direct, real-time ultrasound guidance after the overlying soft tissues were anesthetized with 1% lidocaine with epinephrine. Ultrasound image documentation was performed. The microwire was kinked to measure appropriate catheter length. A stiff Glidewire was advanced to the level of the IVC and the micropuncture sheath was exchanged for a peel-away sheath. A Palindrome tunneled hemodialysis catheter measuring 23 cm from tip to cuff was tunneled in a retrograde fashion from the anterior chest wall to the venotomy incision.  The catheter was then placed through the peel-away sheath with tips ultimately positioned within the superior aspect of the right atrium. Final catheter positioning was confirmed and documented with a spot radiographic image. The catheter aspirates and flushes normally. The catheter was flushed with  appropriate volume heparin dwells.  The catheter exit site was secured with a 0-Prolene retention suture. The venotomy incision was closed with an interrupted 4-0 Vicryl, Dermabond and Steri-strips. Dressings were applied. The patient tolerated the procedure well without immediate post procedural complication.  IMPRESSION: Successful placement of 23 cm tip to cuff tunneled hemodialysis catheter via the left internal jugular vein with tips terminating within the superior aspect of the right atrium. The catheter is ready for immediate use.   Electronically Signed   By: Sandi Mariscal M.D.   On: 10/02/2014 16:07    Scheduled Meds: . aspirin  325 mg Oral Daily  . famotidine  20 mg Oral Daily  . gabapentin  100 mg Oral TID  . heparin  5,000 Units Subcutaneous 3 times per day  . Influenza vac split quadrivalent PF  0.5 mL Intramuscular Tomorrow-1000  . insulin aspart  0-20 Units Subcutaneous TID WC  . insulin aspart  0-5 Units Subcutaneous QHS  . insulin glargine  13 Units Subcutaneous Daily  . labetalol  300 mg Oral BID  . lisinopril  40 mg Oral QPM  . mupirocin ointment  1 application Nasal BID  . sevelamer carbonate  1,600 mg Oral TID WC  . sodium chloride  3 mL Intravenous Q12H  . [START ON 10/04/2014] Vitamin D (Ergocalciferol)  50,000 Units Oral Q7 days   Continuous Infusions: . sodium chloride     Antibiotics Given (last 72 hours)    Date/Time Action Medication Dose Rate   09/30/14 1741  Given   ceFAZolin (ANCEF) IVPB 2 g/50 mL premix 2 g 100 mL/hr   10/02/14 1510 Given   ceFAZolin (ANCEF) IVPB 2 g/50 mL premix 2 g 100 mL/hr   10/02/14 1939 Given  [during HD]   vancomycin (VANCOCIN) IVPB 1000 mg/200 mL premix 1,000 mg 200 mL/hr      Principal Problem:   CRBSI (catheter-related bloodstream infection) Active Problems:   Sepsis   Line sepsis   ESRD (end stage renal disease) on dialysis   DM (diabetes mellitus)   HTN (hypertension)   SVT (supraventricular tachycardia)   Atrial  fibrillation   Type 2 diabetes mellitus with diabetic chronic kidney disease   Staphylococcus aureus bacteremia with sepsis   Spinal stenosis of lumbar region   Screen for STD (sexually transmitted disease)    Time spent: 25 min    Syerra Abdelrahman  [facility] Pager [phone]. If 7PM-7AM, please contact night-coverage at [email or web address], password [facility] 10/03/2014, 8:59 AM  LOS: 5 days

## 2014-10-03 NOTE — Progress Notes (Signed)
ANTIBIOTIC CONSULT NOTE - FOLLOW UP  Pharmacy Consult for vancomycin Indication: MRSA bacteremia  No Known Allergies  Patient Measurements: Height: 6' (182.9 cm) Weight: 251 lb 15.8 oz (114.3 kg) (BEDSCALE) IBW/kg (Calculated) : 77.6  Vital Signs: Temp: 98 F (36.7 C) (11/04 1037) Temp Source: Oral (11/04 1037) BP: 160/97 mmHg (11/04 1040) Pulse Rate: 104 (11/04 1040) Intake/Output from previous day: 11/03 0701 - 11/04 0700 In: 240 [P.O.:240] Out: 0  Intake/Output from this shift:    Labs:  Recent Labs  10/01/14 0250 10/02/14 1615 10/02/14 1643 10/03/14 0121  WBC 9.6 11.4*  --  9.9  HGB 11.4* 9.1*  --  9.6*  PLT 190 348  --  304  CREATININE 10.73*  --  13.23* 6.94*   Estimated Creatinine Clearance: 15.5 mL/min (by C-G formula based on Cr of 6.94). No results for input(s): VANCOTROUGH, VANCOPEAK, VANCORANDOM, GENTTROUGH, GENTPEAK, GENTRANDOM, TOBRATROUGH, TOBRAPEAK, TOBRARND, AMIKACINPEAK, AMIKACINTROU, AMIKACIN in the last 72 hours.   Microbiology: Recent Results (from the past 720 hour(s))  Culture, blood (routine x 2)     Status: None   Collection Time: 09/28/14  6:48 PM  Result Value Ref Range Status   Specimen Description BLOOD RIGHT ARM  Final   Special Requests BOTTLES DRAWN AEROBIC AND ANAEROBIC 5CC  Final   Culture  Setup Time   Final    09/28/2014 22:33 Performed at [facility]    Culture   Final    STAPHYLOCOCCUS AUREUS Note: SUSCEPTIBILITIES PERFORMED ON PREVIOUS CULTURE WITHIN THE LAST 5 DAYS. Note: Gram Stain Report Called to,Read Back By and Verified With: KAITLYN REID 09/29/14 @ 12:21PM BY RUSCOE A. Performed at [facility]    Report Status 10/01/2014 FINAL  Final  Culture, blood (routine x 2)     Status: None   Collection Time: 09/28/14  7:29 PM  Result Value Ref Range Status   Specimen Description BLOOD HAND RIGHT  Final   Special Requests BOTTLES DRAWN AEROBIC AND ANAEROBIC 5CC  Final   Culture  Setup Time   Final   09/29/2014 01:20 Performed at [facility]    Culture   Final    METHICILLIN RESISTANT STAPHYLOCOCCUS AUREUS Note: RIFAMPIN AND GENTAMICIN SHOULD NOT BE USED AS SINGLE DRUGS FOR TREATMENT OF STAPH INFECTIONS. CRITICAL RESULT CALLED TO, READ BACK BY AND VERIFIED WITH: RICKY OUSTERMAN 09/30/14 @ 8:17PM BY RUSCOE A. Note: Gram Stain Report Called to,Read Back By and Verified With: KAITLYN REID 09/29/14 @ 2:59PM BY RUSCOE A. Performed at [facility]    Report Status 10/01/2014 FINAL  Final   Organism ID, Bacteria METHICILLIN RESISTANT STAPHYLOCOCCUS AUREUS  Final      Susceptibility   Methicillin resistant staphylococcus aureus - MIC*    CLINDAMYCIN >=8 RESISTANT Resistant     ERYTHROMYCIN >=8 RESISTANT Resistant     GENTAMICIN <=0.5 SENSITIVE Sensitive     LEVOFLOXACIN >=8 RESISTANT Resistant     OXACILLIN >=4 RESISTANT Resistant     PENICILLIN >=0.5 RESISTANT Resistant     RIFAMPIN <=0.5 SENSITIVE Sensitive     TRIMETH/SULFA <=10 SENSITIVE Sensitive     VANCOMYCIN 1 SENSITIVE Sensitive     TETRACYCLINE <=1 SENSITIVE Sensitive     * METHICILLIN RESISTANT STAPHYLOCOCCUS AUREUS  MRSA PCR Screening     Status: Abnormal   Collection Time: 09/28/14 10:03 PM  Result Value Ref Range Status   MRSA by PCR POSITIVE (A) NEGATIVE Final    Comment:        The  GeneXpert MRSA Assay (FDA approved for NASAL specimens only), is one component of a comprehensive MRSA colonization surveillance program. It is not intended to diagnose MRSA infection nor to guide or monitor treatment for MRSA infections. RESULT CALLED TO, READ BACK BY AND VERIFIED WITH: CALLED TO RN MICHELLE EVANGELISTA [phone] @0243  THANEY  Culture, blood (routine x 2)     Status: None (Preliminary result)   Collection Time: 09/30/14  5:33 PM  Result Value Ref Range Status   Specimen Description BLOOD RIGHT HAND  Final   Special Requests BOTTLES DRAWN AEROBIC ONLY 5CC  Final   Culture  Setup Time   Final     10/01/2014 02:24 Performed at [facility]    Culture   Final           BLOOD CULTURE RECEIVED NO GROWTH TO DATE CULTURE WILL BE HELD FOR 5 DAYS BEFORE ISSUING A FINAL NEGATIVE REPORT Performed at [facility]    Report Status PENDING  Incomplete  Culture, blood (routine x 2)     Status: None (Preliminary result)   Collection Time: 09/30/14  5:39 PM  Result Value Ref Range Status   Specimen Description BLOOD LEFT HAND  Final   Special Requests BOTTLES DRAWN AEROBIC ONLY 5CC  Final   Culture  Setup Time   Final    10/01/2014 02:24 Performed at [facility]    Culture   Final           BLOOD CULTURE RECEIVED NO GROWTH TO DATE CULTURE WILL BE HELD FOR 5 DAYS BEFORE ISSUING A FINAL NEGATIVE REPORT Performed at [facility]    Report Status PENDING  Incomplete  Urine culture     Status: None   Collection Time: 10/01/14  3:16 AM  Result Value Ref Range Status   Specimen Description URINE, RANDOM  Final   Special Requests NONE  Final   Culture  Setup Time   Final    10/01/2014 09:41 Performed at [place]   Final    6,000 COLONIES/ML Performed at [facility]    Culture   Final    INSIGNIFICANT GROWTH Performed at [facility]    Report Status 10/02/2014 FINAL  Final    Anti-infectives    Start     Dose/Rate Route Frequency Ordered Stop   10/02/14 2000  vancomycin (VANCOCIN) IVPB 1000 mg/200 mL premix     1,000 mg200 mL/hr over 60 Minutes Intravenous  Once 10/02/14 1127 10/02/14 2039   10/02/14 1300  ceFAZolin (ANCEF) IVPB 2 g/50 mL premix    Comments:  Hang ON CALL to xray 11/3   2 g100 mL/hr over 30 Minutes Intravenous On call 10/02/14 1251 10/02/14 1540   10/01/14 1200  vancomycin (VANCOCIN) IVPB 1000 mg/200 mL premix  Status:  Discontinued     1,000 mg200 mL/hr over 60 Minutes Intravenous Every M-W-F (Hemodialysis) 09/30/14 1619 09/30/14 1622   10/01/14 1200  ceFAZolin (ANCEF) IVPB 2 g/50 mL  premix  Status:  Discontinued     2 g100 mL/hr over 30 Minutes Intravenous Every M-W-F (Hemodialysis) 09/30/14 1619 09/30/14 1622   09/30/14 1700  ceFAZolin (ANCEF) IVPB 2 g/50 mL premix     2 g100 mL/hr over 30 Minutes Intravenous  Once 09/30/14 1619 09/30/14 1811   09/28/14 2000  piperacillin-tazobactam (ZOSYN) IVPB 2.25 g  Status:  Discontinued     2.25 g100 mL/hr over 30 Minutes Intravenous Every 8 hours 09/28/14  1905 09/30/14 1610   09/28/14 1915  vancomycin (VANCOCIN) 2,000 mg in sodium chloride 0.9 % 500 mL IVPB     2,000 mg250 mL/hr over 120 Minutes Intravenous  Once 09/28/14 1905 09/28/14 2208      Assessment: 58 yo male with MRSA bacteremia on vancomycin  Patient with ESRD with new cath placement on 11/3 with HD and plans for HD 11/5 then HD MWF.  TEE is pending.  10/30 Vancomycin>> 10/30 Zosyn >> 11/1 11/1 Cefazolin>> 11/1  10/30 blood- MRSA   Goal of Therapy:  Pre-HD vancomycin level: 15-25  Plan:  -Vancomycin 1000mg  IV with HD 11/5 then MWF -Will follow for length of therapy and consider a pre-HD vancomycin level on Friday or Monday  Hildred Laser, Pharm D 10/03/2014 10:52 AM

## 2014-10-03 NOTE — Progress Notes (Signed)
[facility] Progress Note   Subjective: No change L post hip pain. MRI showing some fluid around left SI joint, possible joint space infection.    Filed Vitals:   10/02/14 2042 10/02/14 2120 10/03/14 0126 10/03/14 0737  BP:  172/98 165/96 158/94  Pulse:  103 103 108  Temp: 97.7 F (36.5 C)   98.3 F (36.8 C)  TempSrc: Oral Oral  Oral  Resp: 17 18 18 18   Height:      Weight: 117 kg (257 lb 15 oz)   114.3 kg (251 lb 15.8 oz)  SpO2: 95% 97% 98% 93%   Exam: GEN: NAD, appears fatigued CV: tachy, regular, no murmur or rub PULM: Clear on anterior auscultation ABD: Soft nontender, obese EXT: L hip point tender, limited ROM left leg d/t pain, no LE edema. No knee of ankle joint effusion NEURO: unable to move L leg d/t pain, R leg no problems, Ox 3  HD: MWF [place] [facility] [place] [phone]) 4h  F200   262 lb dry wt (119kg)  350/800  Heparin 4800 > 500 u/hr       Assessment: 1 Catheter-related bacteremia / MRSA - IV vanc/zosyn 10/30, for TEE. HD cath removed and replaced yest 2 Left hip pain - persistent, left SI joint changes on MRI, could have septic joint related to bacteremia 3 ESRD on HD, had HD last night 4 HTN/vol - on 2 BP meds here, 4 at home. BP upper limits of normal, below dry wt -3kg 5 Anemia - Hb stable, no esa 6 HPTH cont renvela ac 7 Afib w RVR - on MTP 12.5 bid 8 DM per primary  Plan- HD tomorrow then Friday to get back on schedule.  Try to reduce dry wt further for BP control.     Kelly Splinter MD  pager [phone]    cell [phone]  10/03/2014, 8:57 AM     Recent Labs Lab 10/01/14 0250 10/02/14 1643 10/03/14 0121  NA 135* 134* 135*  K 4.6 5.0 4.3  CL 93* 92* 96  CO2 23 20 23   GLUCOSE 129* 126* 110*  BUN 64* 78* 33*  CREATININE 10.73* 13.23* 6.94*  CALCIUM 8.9 8.7 8.8  PHOS  --  6.2*  --     Recent Labs Lab 09/28/14 1817 10/02/14 1643  AST 31  --   ALT 24  --   ALKPHOS 129*  --   BILITOT 0.7  --   PROT 8.3  --   ALBUMIN  3.2* 2.2*    Recent Labs Lab 09/28/14 1817  10/01/14 0250 10/02/14 1615 10/03/14 0121  WBC 16.9*  < > 9.6 11.4* 9.9  NEUTROABS 14.6*  --   --   --   --   HGB 11.4*  < > 11.4* 9.1* 9.6*  HCT 34.3*  < > 36.0* 27.7* 29.8*  MCV 83.5  < > 80.9 81.0 82.1  PLT 139*  < > 190 348 304  < > = values in this interval not displayed. Marland Kitchen aspirin  325 mg Oral Daily  . famotidine  20 mg Oral Daily  . gabapentin  100 mg Oral TID  . heparin  5,000 Units Subcutaneous 3 times per day  . Influenza vac split quadrivalent PF  0.5 mL Intramuscular Tomorrow-1000  . insulin aspart  0-20 Units Subcutaneous TID WC  . insulin aspart  0-5 Units Subcutaneous QHS  . insulin glargine  13 Units Subcutaneous Daily  . labetalol  300 mg Oral BID  . lisinopril  40 mg Oral QPM  . mupirocin ointment  1 application Nasal BID  . sevelamer carbonate  1,600 mg Oral TID WC  . sodium chloride  3 mL Intravenous Q12H  . [START ON 10/04/2014] Vitamin D (Ergocalciferol)  50,000 Units Oral Q7 days   . sodium chloride     acetaminophen **OR** acetaminophen, oxyCODONE

## 2014-10-03 NOTE — CV Procedure (Signed)
    Transesophageal Echocardiogram Note  JAWONE WIERZBOWSKI [id number] October 18, 1956  Procedure: Transesophageal Echocardiogram Indications: staph bacteremia  Procedure Details Consent: Obtained Time Out: Verified patient identification, verified procedure, site/side was marked, verified correct patient position, special equipment/implants available, Radiology Safety Procedures followed,  medications/allergies/relevent history reviewed, required imaging and test results available.  Performed  Medications: Fentanyl: 50 mcg iv Versed: 4 mg iv  Left Ventrical:  Mild LV dysfunction - EF 40-45%  Mitral Valve: normal MV, mild MR  Aortic Valve: normal AV, no vegetations. Trace - mild AI  Tricuspid Valve: normal TV, trace TR  Pulmonic Valve: not seen very well but appears to be normal   Left Atrium/ Left atrial appendage: normal , no thrombi  Atrial septum: no PFO or ASD by color flow  Aorta: mild calcification,  I was not able to see the tip of the IJ dialysis catheter   Complications: No apparent complications Patient did tolerate procedure well.   Thayer Headings, Brooke Bonito., MD, [facility] 10/03/2014, 10:28 AM

## 2014-10-03 NOTE — Progress Notes (Signed)
[facility]    Day # 6 vancomycin  1 day ancef  3 days zosyn  Subjective:   Complaining of back pain radiating down his right leg    Antibiotics:  Anti-infectives    Start     Dose/Rate Route Frequency Ordered Stop   10/05/14 1200  vancomycin (VANCOCIN) IVPB 1000 mg/200 mL premix     1,000 mg200 mL/hr over 60 Minutes Intravenous Every M-W-F (Hemodialysis) 10/03/14 1057     10/04/14 1800  vancomycin (VANCOCIN) IVPB 1000 mg/200 mL premix     1,000 mg200 mL/hr over 60 Minutes Intravenous  Once 10/03/14 1057     10/02/14 2000  vancomycin (VANCOCIN) IVPB 1000 mg/200 mL premix     1,000 mg200 mL/hr over 60 Minutes Intravenous  Once 10/02/14 1127 10/02/14 2039   10/02/14 1300  ceFAZolin (ANCEF) IVPB 2 g/50 mL premix    Comments:  Hang ON CALL to xray 11/3   2 g100 mL/hr over 30 Minutes Intravenous On call 10/02/14 1251 10/02/14 1540   10/01/14 1200  vancomycin (VANCOCIN) IVPB 1000 mg/200 mL premix  Status:  Discontinued     1,000 mg200 mL/hr over 60 Minutes Intravenous Every M-W-F (Hemodialysis) 09/30/14 1619 09/30/14 1622   10/01/14 1200  ceFAZolin (ANCEF) IVPB 2 g/50 mL premix  Status:  Discontinued     2 g100 mL/hr over 30 Minutes Intravenous Every M-W-F (Hemodialysis) 09/30/14 1619 09/30/14 1622   09/30/14 1700  ceFAZolin (ANCEF) IVPB 2 g/50 mL premix     2 g100 mL/hr over 30 Minutes Intravenous  Once 09/30/14 1619 09/30/14 1811   09/28/14 2000  piperacillin-tazobactam (ZOSYN) IVPB 2.25 g  Status:  Discontinued     2.25 g100 mL/hr over 30 Minutes Intravenous Every 8 hours 09/28/14 1905 09/30/14 1610   09/28/14 1915  vancomycin (VANCOCIN) 2,000 mg in sodium chloride 0.9 % 500 mL IVPB     2,000 mg250 mL/hr over 120 Minutes Intravenous  Once 09/28/14 1905 09/28/14 2208      Medications: Scheduled Meds: . aspirin  325 mg Oral Daily  . famotidine  20 mg Oral Daily  . gabapentin  100 mg Oral TID  . heparin  5,000 Units Subcutaneous 3 times per day    . Influenza vac split quadrivalent PF  0.5 mL Intramuscular Tomorrow-1000  . insulin aspart  0-20 Units Subcutaneous TID WC  . insulin aspart  0-5 Units Subcutaneous QHS  . insulin glargine  13 Units Subcutaneous Daily  . labetalol  300 mg Oral BID  . lisinopril  40 mg Oral QPM  . sevelamer carbonate  1,600 mg Oral TID WC  . sodium chloride  3 mL Intravenous Q12H  . [START ON 10/04/2014] vancomycin  1,000 mg Intravenous Once  . [START ON 10/05/2014] vancomycin  1,000 mg Intravenous Q M,W,F-HD  . [START ON 10/04/2014] Vitamin D (Ergocalciferol)  50,000 Units Oral Q7 days   Continuous Infusions: . sodium chloride 500 mL (10/03/14 0922)   PRN Meds:.acetaminophen **OR** acetaminophen, oxyCODONE    Objective: Weight change: -6.4 oz (-0.181 kg)  Intake/Output Summary (Last 24 hours) at 10/03/14 1936 Last data filed at 10/03/14 1800  Gross per 24 hour  Intake    480 ml  Output      0 ml  Net    480 ml   Blood pressure 169/97, pulse 98, temperature 98.2 F (36.8 C), temperature source Oral, resp. rate 18, height 6' (1.829 m), weight 251 lb 15.8 oz (114.3 kg), SpO2  97 %. Temp:  [97.7 F (36.5 C)-98.3 F (36.8 C)] 98.2 F (36.8 C) (11/04 1200) Pulse Rate:  [81-111] 98 (11/04 1745) Resp:  [15-19] 18 (11/04 1400) BP: (139-182)/(92-123) 169/97 mmHg (11/04 1745) SpO2:  [93 %-98 %] 97 % (11/04 1400) Weight:  [251 lb 15.8 oz (114.3 kg)-257 lb 15 oz (117 kg)] 251 lb 15.8 oz (114.3 kg) (11/04 0737)  Physical Exam: General: Alert and awake, oriented x3, not in any acute distress. HEENT: anicteric sclera,  EOMI CVS regular rate, normal r,  no murmur rubs or gallops Chest: clear to auscultation bilaterally, no wheezing, rales or rhonchi Abdomen: soft nontender, nondistended, normal bowel sounds, Extremities: no  clubbing or edema noted bilaterally Skin: no rashes Neuro: nonfocal  CBC: CBC Latest Ref Rng 10/03/2014 10/02/2014 10/01/2014  WBC 4.0 - 10.5 K/uL 9.9 11.4(H) 9.6  Hemoglobin  13.0 - 17.0 g/dL 9.6(L) 9.1(L) 11.4(L)  Hematocrit 39.0 - 52.0 % 29.8(L) 27.7(L) 36.0(L)  Platelets 150 - 400 K/uL 304 348 190     BMET  Recent Labs  10/02/14 1643 10/03/14 0121  NA 134* 135*  K 5.0 4.3  CL 92* 96  CO2 20 23  GLUCOSE 126* 110*  BUN 78* 33*  CREATININE 13.23* 6.94*  CALCIUM 8.7 8.8     Liver Panel   Recent Labs  10/02/14 1643  ALBUMIN 2.2*       Sedimentation Rate  Recent Labs  10/02/14 0541  ESRSEDRATE 98*   C-Reactive Protein  Recent Labs  10/02/14 0541  CRP 24.4*    Micro Results: Recent Results (from the past 720 hour(s))  Culture, blood (routine x 2)     Status: None   Collection Time: 09/28/14  6:48 PM  Result Value Ref Range Status   Specimen Description BLOOD RIGHT ARM  Final   Special Requests BOTTLES DRAWN AEROBIC AND ANAEROBIC 5CC  Final   Culture  Setup Time   Final    09/28/2014 22:33 Performed at [facility]    Culture   Final    STAPHYLOCOCCUS AUREUS Note: SUSCEPTIBILITIES PERFORMED ON PREVIOUS CULTURE WITHIN THE LAST 5 DAYS. Note: Gram Stain Report Called to,Read Back By and Verified With: KAITLYN REID 09/29/14 @ 12:21PM BY RUSCOE A. Performed at [facility]    Report Status 10/01/2014 FINAL  Final  Culture, blood (routine x 2)     Status: None   Collection Time: 09/28/14  7:29 PM  Result Value Ref Range Status   Specimen Description BLOOD HAND RIGHT  Final   Special Requests BOTTLES DRAWN AEROBIC AND ANAEROBIC 5CC  Final   Culture  Setup Time   Final    09/29/2014 01:20 Performed at [facility]    Culture   Final    METHICILLIN RESISTANT STAPHYLOCOCCUS AUREUS Note: RIFAMPIN AND GENTAMICIN SHOULD NOT BE USED AS SINGLE DRUGS FOR TREATMENT OF STAPH INFECTIONS. CRITICAL RESULT CALLED TO, READ BACK BY AND VERIFIED WITH: RICKY OUSTERMAN 09/30/14 @ 8:17PM BY RUSCOE A. Note: Gram Stain Report Called to,Read Back By and Verified With: KAITLYN REID 09/29/14 @ 2:59PM BY RUSCOE A. Performed  at [facility]    Report Status 10/01/2014 FINAL  Final   Organism ID, Bacteria METHICILLIN RESISTANT STAPHYLOCOCCUS AUREUS  Final      Susceptibility   Methicillin resistant staphylococcus aureus - MIC*    CLINDAMYCIN >=8 RESISTANT Resistant     ERYTHROMYCIN >=8 RESISTANT Resistant     GENTAMICIN <=0.5 SENSITIVE Sensitive     LEVOFLOXACIN >=8 RESISTANT  Resistant     OXACILLIN >=4 RESISTANT Resistant     PENICILLIN >=0.5 RESISTANT Resistant     RIFAMPIN <=0.5 SENSITIVE Sensitive     TRIMETH/SULFA <=10 SENSITIVE Sensitive     VANCOMYCIN 1 SENSITIVE Sensitive     TETRACYCLINE <=1 SENSITIVE Sensitive     * METHICILLIN RESISTANT STAPHYLOCOCCUS AUREUS  MRSA PCR Screening     Status: Abnormal   Collection Time: 09/28/14 10:03 PM  Result Value Ref Range Status   MRSA by PCR POSITIVE (A) NEGATIVE Final    Comment:        The GeneXpert MRSA Assay (FDA approved for NASAL specimens only), is one component of a comprehensive MRSA colonization surveillance program. It is not intended to diagnose MRSA infection nor to guide or monitor treatment for MRSA infections. RESULT CALLED TO, READ BACK BY AND VERIFIED WITH: CALLED TO RN MICHELLE EVANGELISTA [id number] @0243  THANEY  Culture, blood (routine x 2)     Status: None (Preliminary result)   Collection Time: 09/30/14  5:33 PM  Result Value Ref Range Status   Specimen Description BLOOD RIGHT HAND  Final   Special Requests BOTTLES DRAWN AEROBIC ONLY 5CC  Final   Culture  Setup Time   Final    10/01/2014 02:24 Performed at [facility]    Culture   Final           BLOOD CULTURE RECEIVED NO GROWTH TO DATE CULTURE WILL BE HELD FOR 5 DAYS BEFORE ISSUING A FINAL NEGATIVE REPORT Performed at [facility]    Report Status PENDING  Incomplete  Culture, blood (routine x 2)     Status: None (Preliminary result)   Collection Time: 09/30/14  5:39 PM  Result Value Ref Range Status   Specimen Description BLOOD LEFT HAND  Final    Special Requests BOTTLES DRAWN AEROBIC ONLY 5CC  Final   Culture  Setup Time   Final    10/01/2014 02:24 Performed at [facility]    Culture   Final           BLOOD CULTURE RECEIVED NO GROWTH TO DATE CULTURE WILL BE HELD FOR 5 DAYS BEFORE ISSUING A FINAL NEGATIVE REPORT Performed at [facility]    Report Status PENDING  Incomplete  Urine culture     Status: None   Collection Time: 10/01/14  3:16 AM  Result Value Ref Range Status   Specimen Description URINE, RANDOM  Final   Special Requests NONE  Final   Culture  Setup Time   Final    10/01/2014 09:41 Performed at [place]   Final    6,000 COLONIES/ML Performed at [facility]    Culture   Final    INSIGNIFICANT GROWTH Performed at [facility]    Report Status 10/02/2014 FINAL  Final    Studies/Results: Mr Hip Left Wo Contrast  10/02/2014   CLINICAL DATA:  Left hip pain radiating down the leg.  EXAM: MR OF THE LEFT HIP WITHOUT CONTRAST  TECHNIQUE: Multiplanar, multisequence MR imaging was performed. No intravenous contrast was administered.  COMPARISON:  08/15/2014  FINDINGS: Bones: Abnormal left sacroiliac joint with joint effusion inferiorly (image 5, series 6), and abnormal edema signal along the margins of the joint, especially the iliac side. Subtle edema anteriorly in the left pubic body.  Articular cartilage and labrum  Articular cartilage: Mild to moderate but symmetric degenerative articular cartilage thinning. Subcortical cyst formation laterally in the  acetabulum.  Labrum:  Grossly intact  Joint or bursal effusion  Joint effusion:  Absent  Bursae:  Unremarkable  Muscles and tendons  Muscles and tendons: There is abnormal edema tracking along tissue planes adjacent to the left sacroiliac joint, including deep to the left iliacus muscle, the sciatic notch, and adjacent left gluteal musculature. Presacral fluid noted along with edema signal in the left  piriformis muscle. Low-level but symmetric edema tracks along the hip adductor musculature. Mild proximal hamstring tendinopathy on the left.  Other findings  Miscellaneous: Transitional lumbosacral vertebra with broad transverse processes articulating with the rest of the sacrum.  IMPRESSION: 1. Abnormal edema along the left sacroiliac joint with a small SI joint effusion and edema tracking along adjacent muscular and tissue planes. Appearance concerning for septic left sacroiliac joint. 2. Bilateral degenerative arthropathy of the hips. 3. Proximal hamstring tendinopathy on the left.   Electronically Signed   By: Sherryl Barters M.D.   On: 10/02/2014 11:48   Ir Fluoro Guide Cv Line Left  10/02/2014   INDICATION: History of right internal jugular approach tunneled dialysis catheter placement on 08/21/2014, admitted with bacteremia with subsequent removal of the dialysis catheter on 09/29/2014. Patient has remained and antibiotics with normalization of white blood cell count and has remained afebrile for the past 48 hours. Request made for placement of a new tunneled dialysis catheter for continuation of dialysis.  EXAM: TUNNELED CENTRAL VENOUS HEMODIALYSIS CATHETER PLACEMENT WITH ULTRASOUND AND FLUOROSCOPIC GUIDANCE  MEDICATIONS: Ancef 2 gm IV; The IV antibiotic was given in an appropriate time interval prior to skin puncture.  CONTRAST:  None  ANESTHESIA/SEDATION: Versed 1 mg IV; Fentanyl 50 mcg IV  Total Moderate Sedation Time  11 minutes.  FLUOROSCOPY TIME:  54 seconds.  COMPLICATIONS: None immediate  PROCEDURE: Informed written consent was obtained from the patient after a discussion of the risks, benefits, and alternatives to treatment. Questions regarding the procedure were encouraged and answered.  Given concern for recent infection involving the right internal jugular approach dialysis catheter, the decision was made to place a left internal jugular approach dialysis catheter. As such, the left neck and  chest were prepped with chlorhexidine in a sterile fashion, and a sterile drape was applied covering the operative field. Maximum barrier sterile technique with sterile gowns and gloves were used for the procedure. A timeout was performed prior to the initiation of the procedure.  After creating a small venotomy incision, a micropuncture kit was utilized to access the left internal jugular vein under direct, real-time ultrasound guidance after the overlying soft tissues were anesthetized with 1% lidocaine with epinephrine. Ultrasound image documentation was performed. The microwire was kinked to measure appropriate catheter length. A stiff Glidewire was advanced to the level of the IVC and the micropuncture sheath was exchanged for a peel-away sheath. A Palindrome tunneled hemodialysis catheter measuring 23 cm from tip to cuff was tunneled in a retrograde fashion from the anterior chest wall to the venotomy incision.  The catheter was then placed through the peel-away sheath with tips ultimately positioned within the superior aspect of the right atrium. Final catheter positioning was confirmed and documented with a spot radiographic image. The catheter aspirates and flushes normally. The catheter was flushed with appropriate volume heparin dwells.  The catheter exit site was secured with a 0-Prolene retention suture. The venotomy incision was closed with an interrupted 4-0 Vicryl, Dermabond and Steri-strips. Dressings were applied. The patient tolerated the procedure well without immediate post procedural complication.  IMPRESSION:  Successful placement of 23 cm tip to cuff tunneled hemodialysis catheter via the left internal jugular vein with tips terminating within the superior aspect of the right atrium. The catheter is ready for immediate use.   Electronically Signed   By: Sandi Mariscal M.D.   On: 10/02/2014 16:07   Ir US Guide Vasc Access Left  10/02/2014   INDICATION: History of right internal jugular approach  tunneled dialysis catheter placement on 08/21/2014, admitted with bacteremia with subsequent removal of the dialysis catheter on 09/29/2014. Patient has remained and antibiotics with normalization of white blood cell count and has remained afebrile for the past 48 hours. Request made for placement of a new tunneled dialysis catheter for continuation of dialysis.  EXAM: TUNNELED CENTRAL VENOUS HEMODIALYSIS CATHETER PLACEMENT WITH ULTRASOUND AND FLUOROSCOPIC GUIDANCE  MEDICATIONS: Ancef 2 gm IV; The IV antibiotic was given in an appropriate time interval prior to skin puncture.  CONTRAST:  None  ANESTHESIA/SEDATION: Versed 1 mg IV; Fentanyl 50 mcg IV  Total Moderate Sedation Time  11 minutes.  FLUOROSCOPY TIME:  54 seconds.  COMPLICATIONS: None immediate  PROCEDURE: Informed written consent was obtained from the patient after a discussion of the risks, benefits, and alternatives to treatment. Questions regarding the procedure were encouraged and answered.  Given concern for recent infection involving the right internal jugular approach dialysis catheter, the decision was made to place a left internal jugular approach dialysis catheter. As such, the left neck and chest were prepped with chlorhexidine in a sterile fashion, and a sterile drape was applied covering the operative field. Maximum barrier sterile technique with sterile gowns and gloves were used for the procedure. A timeout was performed prior to the initiation of the procedure.  After creating a small venotomy incision, a micropuncture kit was utilized to access the left internal jugular vein under direct, real-time ultrasound guidance after the overlying soft tissues were anesthetized with 1% lidocaine with epinephrine. Ultrasound image documentation was performed. The microwire was kinked to measure appropriate catheter length. A stiff Glidewire was advanced to the level of the IVC and the micropuncture sheath was exchanged for a peel-away sheath. A  Palindrome tunneled hemodialysis catheter measuring 23 cm from tip to cuff was tunneled in a retrograde fashion from the anterior chest wall to the venotomy incision.  The catheter was then placed through the peel-away sheath with tips ultimately positioned within the superior aspect of the right atrium. Final catheter positioning was confirmed and documented with a spot radiographic image. The catheter aspirates and flushes normally. The catheter was flushed with appropriate volume heparin dwells.  The catheter exit site was secured with a 0-Prolene retention suture. The venotomy incision was closed with an interrupted 4-0 Vicryl, Dermabond and Steri-strips. Dressings were applied. The patient tolerated the procedure well without immediate post procedural complication.  IMPRESSION: Successful placement of 23 cm tip to cuff tunneled hemodialysis catheter via the left internal jugular vein with tips terminating within the superior aspect of the right atrium. The catheter is ready for immediate use.   Electronically Signed   By: Sandi Mariscal M.D.   On: 10/02/2014 16:07      Assessment/Plan:  Principal Problem:   CRBSI (catheter-related bloodstream infection) Active Problems:   Sepsis   Line sepsis   ESRD (end stage renal disease) on dialysis   DM (diabetes mellitus)   HTN (hypertension)   SVT (supraventricular tachycardia)   Atrial fibrillation   Type 2 diabetes mellitus with diabetic chronic kidney disease   Staphylococcus  aureus bacteremia with sepsis   Spinal stenosis of lumbar region   Screen for STD (sexually transmitted disease)    Noah Vang is a 58 y.o. male with  MRSA bacteremia, ESRD on HD sp removal of HD line. He has had severe back pain and MRI shows spinal analysis but no clear-cut infection transesophageal echocardiogram is pending  #1      [place] Antimicrobial Management Team Staphylococcus aureus bacteremia   Staphylococcus aureus bacteremia (SAB) is associated  with a high rate of complications and mortality.  Specific aspects of clinical management are critical to optimizing the outcome of patients with SAB.  Therefore, the [facility] Antimicrobial Management Team [facility]) has initiated an intervention aimed at improving the management of SAB at [facility].  To do so, Infectious Diseases physicians are providing an evidence-based consult for the management of all patients with SAB.     Yes No Comments  Perform follow-up blood cultures (even if the patient is afebrile) to ensure clearance of bacteremia [x]  []  Blood cultures from 09/30/14 cooking and NGTD  Remove vascular catheter and obtain follow-up blood cultures after the removal of the catheter [x]  []  HD out   Perform echocardiography to evaluate for endocarditis (transthoracic ECHO is 40-50% sensitive, TEE is > 90% sensitive) []  []  TEE NO VEGETATIONS  Consult electrophysiologist to evaluate implanted cardiac device (pacemaker, ICD) []  []  NA  Ensure source control [x]  []  Have all abscesses been drained effectively? Have deep seeded infections (septic joints or osteomyelitis) had appropriate surgical debridement?  Investigate for "metastatic" sites of infection [x]  []  Does the patient have ANY symptom or physical exam finding that would suggest a deeper infection (back or neck pain that may be suggestive of vertebral osteomyelitis or epidural abscess, muscle pain that could be a symptom of pyomyositis)?  Keep in mind that for deep seeded infections MRI imaging with contrast is preferred rather than other often insensitive tests such as plain x-rays, especially early in a patient's presentation.  MRI  SHOWS SACRO-ILIAC INFECTION  Change antibiotic therapy to VANCOMYCIN []  []  Beta-lactam antibiotics are preferred for MSSA due to higher cure rates.   If on Vancomycin, goal trough should be 15 - 20 mcg/mL  Estimated duration of IV antibiotic therapy:  8 WEEKS GIVEN SACRO-ILIAC INFECTION []  []  Consult case  management for probably prolonged outpatient IV antibiotic therapy   KUDOS TO PRIMARY TEAM FOR PURSUING MRI HIP AND DISCOVERING METASTATIC SITE OF HIS INFECTION  Agree with SI joint aspiration though doubt cultures will show anything. Agree with protracted antibiotics and may indeed need repeat MRI hip and L spine prior to stopping abx    LOS: 5 days   Alcide Evener 10/03/2014, 7:36 PM

## 2014-10-03 NOTE — H&P (View-Only) (Signed)
PROGRESS NOTE  Noah Vang ZJI:[id number] DOB: 10/11/1956 DOA: 09/28/2014 PCP: No PCP Per Patient  Noah Vang is a 58 y.o. male sent to the ED from dialysis with tachycardia, fever, chills, hypertension. Symptoms onset during dialysis today. There is associated tenderness, swelling, and warmth at his RIJ tunneled catheter which is his dialysis access site. He states they still finished dialysis today (although he is still fluid overloaded on CXR and hypertensive). Patient reports his access site which has been in for the past 1 month, became tender, swollen, etc 1 week ago.  Patient grew MRSA bacteremia- catheter removed; new access placed 11/3- HD done- needs TEE.  MRI showed possible septic joint  Assessment/Plan: Sepsis due to MRSA bacteremia- most likely source is his RIJ tunneled dialysis catheter  BCx + 2/2 -repeated 11/1- NGTD Vanc/cefazolin - TEE  Nephrology consult- for dialysis today IR pulled infected catheter 10/31  -MRI of spine shows:  Moderate to severe spinal stenosis at L4-5 with grade 1 anterior slip of L4 and L5. L5 nerve root impingement          bilaterally -- follow up outpatient- PRN pain meds -MRI hip :Abnormal edema along the left sacroiliac joint with a small SI joint effusion and edema tracking along adjacent muscular and tissue planes. Appearance concerning for septic left sacroiliac joint--- ortho, Xu consulted  HTN - BB  ESRD - nephrology consulted-  access placed 11/3  DM - continue home lantus, CBG checks AC/HS        Paroxysmal Atrial fib - control with BB, added ASA- back in sinus- discussed further anticoagulation with patient- declined for now        Increased troponins- from rapid heart rate, sepsis- decreasing   Code Status: full Family Communication: patient Disposition Plan:    Consultants:  IR  Nephrology  ID  Cards (tee)  Ortho Erlinda Hong)  Procedures:  IR- catheter pulled  TEE    HPI/Subjective: C/o hip pain  still No SOB, no CP   Objective: Filed Vitals:   10/03/14 0737  BP: 158/94  Pulse: 108  Temp: 98.3 F (36.8 C)  Resp: 18    Intake/Output Summary (Last 24 hours) at 10/03/14 0859 Last data filed at 10/03/14 0600  Gross per 24 hour  Intake    240 ml  Output      0 ml  Net    240 ml   Filed Weights   10/02/14 1628 10/02/14 2042 10/03/14 0737  Weight: 116.9 kg (257 lb 11.5 oz) 117 kg (257 lb 15 oz) 114.3 kg (251 lb 15.8 oz)    Exam:   General:  A+Ox3, NAD  Cardiovascular: rrr  Respiratory: clear  Abdomen: +BS, soft  Musculoskeletal: min LE edema   Data Reviewed: Basic Metabolic Panel:  Recent Labs Lab 09/29/14 0326 09/30/14 0147 10/01/14 0250 10/02/14 1643 10/03/14 0121  NA 137 134* 135* 134* 135*  K 4.8 4.4 4.6 5.0 4.3  CL 96 95* 93* 92* 96  CO2 _0 GLUCOSE 156* 159* 129* 126* 110*  BUN 39* 54* 64* 78* 33*  CREATININE 7.34* 8.99* 10.73* 13.23* 6.94*  CALCIUM 9.1 8.9 8.9 8.7 8.8  PHOS  --   --   --  6.2*  --    Liver Function Tests:  Recent Labs Lab 09/28/14 1817 10/02/14 1643  AST 31  --   ALT 24  --   ALKPHOS 129*  --   BILITOT 0.7  --   PROT  8.3  --   ALBUMIN 3.2* 2.2*   No results for input(s): LIPASE, AMYLASE in the last 168 hours. No results for input(s): AMMONIA in the last 168 hours. CBC:  Recent Labs Lab 09/28/14 1817 09/29/14 0326 09/30/14 0147 10/01/14 0250 10/02/14 1615 10/03/14 0121  WBC 16.9* 13.9* 12.0* 9.6 11.4* 9.9  NEUTROABS 14.6*  --   --   --   --   --   HGB 11.4* 10.2* 9.7* 11.4* 9.1* 9.6*  HCT 34.3* 31.5* 29.6* 36.0* 27.7* 29.8*  MCV 83.5 82.5 80.9 80.9 81.0 82.1  PLT 139* 144* 139* 190 348 304   Cardiac Enzymes:  Recent Labs Lab 09/30/14 1358 09/30/14 1931 10/01/14 0250 10/01/14 0856 10/01/14 1337  TROPONINI <0.30 <0.30 <0.30 <0.30 <0.30   BNP (last 3 results) No results for input(s): PROBNP in the last 8760 hours. CBG:  Recent Labs Lab 10/01/14 1703 10/01/14 2158  10/02/14 0653 10/02/14 1157 10/02/14 2233  GLUCAP 153* 111* 110* 122* 109*    Recent Results (from the past 240 hour(s))  Culture, blood (routine x 2)     Status: None   Collection Time: 09/28/14  6:48 PM  Result Value Ref Range Status   Specimen Description BLOOD RIGHT ARM  Final   Special Requests BOTTLES DRAWN AEROBIC AND ANAEROBIC 5CC  Final   Culture  Setup Time   Final    09/28/2014 22:33 Performed at [facility]    Culture   Final    STAPHYLOCOCCUS AUREUS Note: SUSCEPTIBILITIES PERFORMED ON PREVIOUS CULTURE WITHIN THE LAST 5 DAYS. Note: Gram Stain Report Called to,Read Back By and Verified With: KAITLYN REID 09/29/14 @ 12:21PM BY RUSCOE A. Performed at [facility]    Report Status 10/01/2014 FINAL  Final  Culture, blood (routine x 2)     Status: None   Collection Time: 09/28/14  7:29 PM  Result Value Ref Range Status   Specimen Description BLOOD HAND RIGHT  Final   Special Requests BOTTLES DRAWN AEROBIC AND ANAEROBIC 5CC  Final   Culture  Setup Time   Final    09/29/2014 01:20 Performed at [facility]    Culture   Final    METHICILLIN RESISTANT STAPHYLOCOCCUS AUREUS Note: RIFAMPIN AND GENTAMICIN SHOULD NOT BE USED AS SINGLE DRUGS FOR TREATMENT OF STAPH INFECTIONS. CRITICAL RESULT CALLED TO, READ BACK BY AND VERIFIED WITH: RICKY OUSTERMAN 09/30/14 @ 8:17PM BY RUSCOE A. Note: Gram Stain Report Called to,Read Back By and Verified With: KAITLYN REID 09/29/14 @ 2:59PM BY RUSCOE A. Performed at [facility]    Report Status 10/01/2014 FINAL  Final   Organism ID, Bacteria METHICILLIN RESISTANT STAPHYLOCOCCUS AUREUS  Final      Susceptibility   Methicillin resistant staphylococcus aureus - MIC*    CLINDAMYCIN >=8 RESISTANT Resistant     ERYTHROMYCIN >=8 RESISTANT Resistant     GENTAMICIN <=0.5 SENSITIVE Sensitive     LEVOFLOXACIN >=8 RESISTANT Resistant     OXACILLIN >=4 RESISTANT Resistant     PENICILLIN >=0.5 RESISTANT Resistant      RIFAMPIN <=0.5 SENSITIVE Sensitive     TRIMETH/SULFA <=10 SENSITIVE Sensitive     VANCOMYCIN 1 SENSITIVE Sensitive     TETRACYCLINE <=1 SENSITIVE Sensitive     * METHICILLIN RESISTANT STAPHYLOCOCCUS AUREUS  MRSA PCR Screening     Status: Abnormal   Collection Time: 09/28/14 10:03 PM  Result Value Ref Range Status   MRSA by PCR POSITIVE (A) NEGATIVE Final    Comment:  The GeneXpert MRSA Assay (FDA approved for NASAL specimens only), is one component of a comprehensive MRSA colonization surveillance program. It is not intended to diagnose MRSA infection nor to guide or monitor treatment for MRSA infections. RESULT CALLED TO, READ BACK BY AND VERIFIED WITH: CALLED TO RN MICHELLE EVANGELISTA [id number]  THANEY  Culture, blood (routine x 2)     Status: None (Preliminary result)   Collection Time: 09/30/14  5:33 PM  Result Value Ref Range Status   Specimen Description BLOOD RIGHT HAND  Final   Special Requests BOTTLES DRAWN AEROBIC ONLY 5CC  Final   Culture  Setup Time   Final    10/01/2014 02:24 Performed at [facility]    Culture   Final           BLOOD CULTURE RECEIVED NO GROWTH TO DATE CULTURE WILL BE HELD FOR 5 DAYS BEFORE ISSUING A FINAL NEGATIVE REPORT Performed at [facility]    Report Status PENDING  Incomplete  Culture, blood (routine x 2)     Status: None (Preliminary result)   Collection Time: 09/30/14  5:39 PM  Result Value Ref Range Status   Specimen Description BLOOD LEFT HAND  Final   Special Requests BOTTLES DRAWN AEROBIC ONLY 5CC  Final   Culture  Setup Time   Final    10/01/2014 02:24 Performed at [facility]    Culture   Final           BLOOD CULTURE RECEIVED NO GROWTH TO DATE CULTURE WILL BE HELD FOR 5 DAYS BEFORE ISSUING A FINAL NEGATIVE REPORT Performed at [facility]    Report Status PENDING  Incomplete  Urine culture     Status: None   Collection Time: 10/01/14  3:16 AM  Result Value Ref Range Status    Specimen Description URINE, RANDOM  Final   Special Requests NONE  Final   Culture  Setup Time   Final    10/01/2014 09:41 Performed at [place]   Final    6,000 COLONIES/ML Performed at [facility]    Culture   Final    INSIGNIFICANT GROWTH Performed at [facility]    Report Status 10/02/2014 FINAL  Final     Studies: Mr Hip Left Wo Contrast  10/02/2014   CLINICAL DATA:  Left hip pain radiating down the leg.  EXAM: MR OF THE LEFT HIP WITHOUT CONTRAST  TECHNIQUE: Multiplanar, multisequence MR imaging was performed. No intravenous contrast was administered.  COMPARISON:  08/15/2014  FINDINGS: Bones: Abnormal left sacroiliac joint with joint effusion inferiorly (image 5, series 6), and abnormal edema signal along the margins of the joint, especially the iliac side. Subtle edema anteriorly in the left pubic body.  Articular cartilage and labrum  Articular cartilage: Mild to moderate but symmetric degenerative articular cartilage thinning. Subcortical cyst formation laterally in the acetabulum.  Labrum:  Grossly intact  Joint or bursal effusion  Joint effusion:  Absent  Bursae:  Unremarkable  Muscles and tendons  Muscles and tendons: There is abnormal edema tracking along tissue planes adjacent to the left sacroiliac joint, including deep to the left iliacus muscle, the sciatic notch, and adjacent left gluteal musculature. Presacral fluid noted along with edema signal in the left piriformis muscle. Low-level but symmetric edema tracks along the hip adductor musculature. Mild proximal hamstring tendinopathy on the left.  Other findings  Miscellaneous: Transitional lumbosacral vertebra with broad transverse processes articulating with  the rest of the sacrum.  IMPRESSION: 1. Abnormal edema along the left sacroiliac joint with a small SI joint effusion and edema tracking along adjacent muscular and tissue planes. Appearance concerning for septic left  sacroiliac joint. 2. Bilateral degenerative arthropathy of the hips. 3. Proximal hamstring tendinopathy on the left.   Electronically Signed   By: Sherryl Barters M.D.   On: 10/02/2014 11:48   Ir Fluoro Guide Cv Line Left  10/02/2014   INDICATION: History of right internal jugular approach tunneled dialysis catheter placement on 08/21/2014, admitted with bacteremia with subsequent removal of the dialysis catheter on 09/29/2014. Patient has remained and antibiotics with normalization of white blood cell count and has remained afebrile for the past 48 hours. Request made for placement of a new tunneled dialysis catheter for continuation of dialysis.  EXAM: TUNNELED CENTRAL VENOUS HEMODIALYSIS CATHETER PLACEMENT WITH ULTRASOUND AND FLUOROSCOPIC GUIDANCE  MEDICATIONS: Ancef 2 gm IV; The IV antibiotic was given in an appropriate time interval prior to skin puncture.  CONTRAST:  None  ANESTHESIA/SEDATION: Versed 1 mg IV; Fentanyl 50 mcg IV  Total Moderate Sedation Time  11 minutes.  FLUOROSCOPY TIME:  54 seconds.  COMPLICATIONS: None immediate  PROCEDURE: Informed written consent was obtained from the patient after a discussion of the risks, benefits, and alternatives to treatment. Questions regarding the procedure were encouraged and answered.  Given concern for recent infection involving the right internal jugular approach dialysis catheter, the decision was made to place a left internal jugular approach dialysis catheter. As such, the left neck and chest were prepped with chlorhexidine in a sterile fashion, and a sterile drape was applied covering the operative field. Maximum barrier sterile technique with sterile gowns and gloves were used for the procedure. A timeout was performed prior to the initiation of the procedure.  After creating a small venotomy incision, a micropuncture kit was utilized to access the left internal jugular vein under direct, real-time ultrasound guidance after the overlying soft tissues  were anesthetized with 1% lidocaine with epinephrine. Ultrasound image documentation was performed. The microwire was kinked to measure appropriate catheter length. A stiff Glidewire was advanced to the level of the IVC and the micropuncture sheath was exchanged for a peel-away sheath. A Palindrome tunneled hemodialysis catheter measuring 23 cm from tip to cuff was tunneled in a retrograde fashion from the anterior chest wall to the venotomy incision.  The catheter was then placed through the peel-away sheath with tips ultimately positioned within the superior aspect of the right atrium. Final catheter positioning was confirmed and documented with a spot radiographic image. The catheter aspirates and flushes normally. The catheter was flushed with appropriate volume heparin dwells.  The catheter exit site was secured with a 0-Prolene retention suture. The venotomy incision was closed with an interrupted 4-0 Vicryl, Dermabond and Steri-strips. Dressings were applied. The patient tolerated the procedure well without immediate post procedural complication.  IMPRESSION: Successful placement of 23 cm tip to cuff tunneled hemodialysis catheter via the left internal jugular vein with tips terminating within the superior aspect of the right atrium. The catheter is ready for immediate use.   Electronically Signed   By: Sandi Mariscal M.D.   On: 10/02/2014 16:07   Ir US Guide Vasc Access Left  10/02/2014   INDICATION: History of right internal jugular approach tunneled dialysis catheter placement on 08/21/2014, admitted with bacteremia with subsequent removal of the dialysis catheter on 09/29/2014. Patient has remained and antibiotics with normalization of white blood cell count  and has remained afebrile for the past 48 hours. Request made for placement of a new tunneled dialysis catheter for continuation of dialysis.  EXAM: TUNNELED CENTRAL VENOUS HEMODIALYSIS CATHETER PLACEMENT WITH ULTRASOUND AND FLUOROSCOPIC GUIDANCE   MEDICATIONS: Ancef 2 gm IV; The IV antibiotic was given in an appropriate time interval prior to skin puncture.  CONTRAST:  None  ANESTHESIA/SEDATION: Versed 1 mg IV; Fentanyl 50 mcg IV  Total Moderate Sedation Time  11 minutes.  FLUOROSCOPY TIME:  54 seconds.  COMPLICATIONS: None immediate  PROCEDURE: Informed written consent was obtained from the patient after a discussion of the risks, benefits, and alternatives to treatment. Questions regarding the procedure were encouraged and answered.  Given concern for recent infection involving the right internal jugular approach dialysis catheter, the decision was made to place a left internal jugular approach dialysis catheter. As such, the left neck and chest were prepped with chlorhexidine in a sterile fashion, and a sterile drape was applied covering the operative field. Maximum barrier sterile technique with sterile gowns and gloves were used for the procedure. A timeout was performed prior to the initiation of the procedure.  After creating a small venotomy incision, a micropuncture kit was utilized to access the left internal jugular vein under direct, real-time ultrasound guidance after the overlying soft tissues were anesthetized with 1% lidocaine with epinephrine. Ultrasound image documentation was performed. The microwire was kinked to measure appropriate catheter length. A stiff Glidewire was advanced to the level of the IVC and the micropuncture sheath was exchanged for a peel-away sheath. A Palindrome tunneled hemodialysis catheter measuring 23 cm from tip to cuff was tunneled in a retrograde fashion from the anterior chest wall to the venotomy incision.  The catheter was then placed through the peel-away sheath with tips ultimately positioned within the superior aspect of the right atrium. Final catheter positioning was confirmed and documented with a spot radiographic image. The catheter aspirates and flushes normally. The catheter was flushed with  appropriate volume heparin dwells.  The catheter exit site was secured with a 0-Prolene retention suture. The venotomy incision was closed with an interrupted 4-0 Vicryl, Dermabond and Steri-strips. Dressings were applied. The patient tolerated the procedure well without immediate post procedural complication.  IMPRESSION: Successful placement of 23 cm tip to cuff tunneled hemodialysis catheter via the left internal jugular vein with tips terminating within the superior aspect of the right atrium. The catheter is ready for immediate use.   Electronically Signed   By: Sandi Mariscal M.D.   On: 10/02/2014 16:07    Scheduled Meds: . aspirin  325 mg Oral Daily  . famotidine  20 mg Oral Daily  . gabapentin  100 mg Oral TID  . heparin  5,000 Units Subcutaneous 3 times per day  . Influenza vac split quadrivalent PF  0.5 mL Intramuscular Tomorrow-1000  . insulin aspart  0-20 Units Subcutaneous TID WC  . insulin aspart  0-5 Units Subcutaneous QHS  . insulin glargine  13 Units Subcutaneous Daily  . labetalol  300 mg Oral BID  . lisinopril  40 mg Oral QPM  . mupirocin ointment  1 application Nasal BID  . sevelamer carbonate  1,600 mg Oral TID WC  . sodium chloride  3 mL Intravenous Q12H  . [START ON 10/04/2014] Vitamin D (Ergocalciferol)  50,000 Units Oral Q7 days   Continuous Infusions: . sodium chloride     Antibiotics Given (last 72 hours)    Date/Time Action Medication Dose Rate   09/30/14 1741  Given   ceFAZolin (ANCEF) IVPB 2 g/50 mL premix 2 g 100 mL/hr   10/02/14 1510 Given   ceFAZolin (ANCEF) IVPB 2 g/50 mL premix 2 g 100 mL/hr   10/02/14 1939 Given  [during HD]   vancomycin (VANCOCIN) IVPB 1000 mg/200 mL premix 1,000 mg 200 mL/hr      Principal Problem:   CRBSI (catheter-related bloodstream infection) Active Problems:   Sepsis   Line sepsis   ESRD (end stage renal disease) on dialysis   DM (diabetes mellitus)   HTN (hypertension)   SVT (supraventricular tachycardia)   Atrial  fibrillation   Type 2 diabetes mellitus with diabetic chronic kidney disease   Staphylococcus aureus bacteremia with sepsis   Spinal stenosis of lumbar region   Screen for STD (sexually transmitted disease)    Time spent: 25 min    Kenshawn Maciolek  [facility] Pager [phone]. If 7PM-7AM, please contact night-coverage at [email or web address], password [facility] 10/03/2014, 8:59 AM  LOS: 5 days

## 2014-10-03 NOTE — Progress Notes (Signed)
  Echocardiogram Echocardiogram Transesophageal has been performed.  Saraih Lorton 10/03/2014, 10:31 AM

## 2014-10-03 NOTE — Progress Notes (Signed)
Pt.'s B/P was elevated to 140's-160's/90's-100's, I paged MD on call to advise her of B/P elevation and to see if any additional meds to be given, pt. Was given his lisinopril after last B/P taken, will notify oncoming nurse to recheck B/P. Pt. Stable and showed no other signs or symptoms of distress.

## 2014-10-04 ENCOUNTER — Encounter (HOSPITAL_COMMUNITY): Payer: Self-pay | Admitting: Cardiovascular Disease

## 2014-10-04 DIAGNOSIS — M009 Pyogenic arthritis, unspecified: Secondary | ICD-10-CM

## 2014-10-04 DIAGNOSIS — I150 Renovascular hypertension: Secondary | ICD-10-CM

## 2014-10-04 LAB — RENAL FUNCTION PANEL
Albumin: 2.3 g/dL — ABNORMAL LOW (ref 3.5–5.2)
Anion gap: 20 — ABNORMAL HIGH (ref 5–15)
BUN: 45 mg/dL — ABNORMAL HIGH (ref 6–23)
CALCIUM: 9 mg/dL (ref 8.4–10.5)
CHLORIDE: 97 meq/L (ref 96–112)
CO2: 22 mEq/L (ref 19–32)
Creatinine, Ser: 9.59 mg/dL — ABNORMAL HIGH (ref 0.50–1.35)
GFR calc non Af Amer: 5 mL/min — ABNORMAL LOW (ref >90)
GFR, EST AFRICAN AMERICAN: 6 mL/min — AB (ref >90)
GLUCOSE: 76 mg/dL (ref 70–99)
POTASSIUM: 4.8 meq/L (ref 3.7–5.3)
Phosphorus: 7 mg/dL — ABNORMAL HIGH (ref 2.3–4.6)
Sodium: 139 mEq/L (ref 137–147)

## 2014-10-04 LAB — CBC
HEMATOCRIT: 28.1 % — AB (ref 39.0–52.0)
Hemoglobin: 8.9 g/dL — ABNORMAL LOW (ref 13.0–17.0)
MCH: 26.4 pg (ref 26.0–34.0)
MCHC: 31.7 g/dL (ref 30.0–36.0)
MCV: 83.4 fL (ref 78.0–100.0)
PLATELETS: 351 10*3/uL (ref 150–400)
RBC: 3.37 MIL/uL — ABNORMAL LOW (ref 4.22–5.81)
RDW: 18.2 % — AB (ref 11.5–15.5)
WBC: 9.6 10*3/uL (ref 4.0–10.5)

## 2014-10-04 LAB — HEPATITIS B CORE ANTIBODY, TOTAL: HEP B C TOTAL AB: NONREACTIVE

## 2014-10-04 LAB — GLUCOSE, CAPILLARY
GLUCOSE-CAPILLARY: 75 mg/dL (ref 70–99)
GLUCOSE-CAPILLARY: 108 mg/dL — AB (ref 70–99)
Glucose-Capillary: 88 mg/dL (ref 70–99)
Glucose-Capillary: 117 mg/dL — ABNORMAL HIGH (ref 70–99)

## 2014-10-04 LAB — HEMOGLOBIN A1C
Hgb A1c MFr Bld: 5.9 % — ABNORMAL HIGH (ref <5.7)
MEAN PLASMA GLUCOSE: 123 mg/dL — AB (ref <117)

## 2014-10-04 LAB — HEPATITIS B SURFACE ANTIGEN: Hepatitis B Surface Ag: NEGATIVE

## 2014-10-04 LAB — HEPATITIS B SURFACE ANTIBODY,QUALITATIVE: Hep B S Ab: NEGATIVE

## 2014-10-04 MED ORDER — PENTAFLUOROPROP-TETRAFLUOROETH EX AERO: 1.0000 "application " | INHALATION_SPRAY | CUTANEOUS | Status: DC | PRN

## 2014-10-04 MED ORDER — ALTEPLASE 2 MG IJ SOLR
2.0000 mg | Freq: Once | INTRAMUSCULAR | Status: AC | PRN
  Filled 2014-10-04: qty 2

## 2014-10-04 MED ORDER — CLONIDINE HCL 0.1 MG PO TABS
0.1000 mg | ORAL_TABLET | Freq: Three times a day (TID) | ORAL | Status: DC
  Administered 2014-10-04 – 2014-10-06 (×6): 0.1 mg via ORAL
  Filled 2014-10-04 (×8): qty 1

## 2014-10-04 MED ORDER — HEPARIN SODIUM (PORCINE) 1000 UNIT/ML DIALYSIS
3000.0000 [IU] | INTRAMUSCULAR | Status: DC | PRN
  Filled 2014-10-04: qty 3

## 2014-10-04 MED ORDER — SODIUM CHLORIDE 0.9 % IV SOLN: 100.0000 mL | INTRAVENOUS | Status: DC | PRN

## 2014-10-04 MED ORDER — NEPRO/CARBSTEADY PO LIQD
237.0000 mL | ORAL | Status: DC | PRN
  Filled 2014-10-04: qty 237

## 2014-10-04 MED ORDER — LIDOCAINE HCL (PF) 1 % IJ SOLN: 5.0000 mL | INTRAMUSCULAR | Status: DC | PRN

## 2014-10-04 MED ORDER — LIDOCAINE-PRILOCAINE 2.5-2.5 % EX CREA
1.0000 "application " | TOPICAL_CREAM | CUTANEOUS | Status: DC | PRN
  Filled 2014-10-04: qty 5

## 2014-10-04 MED ORDER — CARVEDILOL 6.25 MG PO TABS
6.2500 mg | ORAL_TABLET | Freq: Two times a day (BID) | ORAL | Status: DC
  Administered 2014-10-04 – 2014-10-06 (×5): 6.25 mg via ORAL
  Filled 2014-10-04 (×6): qty 1

## 2014-10-04 MED ORDER — HEPARIN SODIUM (PORCINE) 1000 UNIT/ML DIALYSIS
1000.0000 [IU] | INTRAMUSCULAR | Status: DC | PRN
  Filled 2014-10-04: qty 1

## 2014-10-04 NOTE — Progress Notes (Signed)
[facility]    Day # 7 vancomycin  1 day ancef  3 days zosyn  Subjective:   Complaining of back pain radiating down his right leg    Antibiotics:  Anti-infectives    Start     Dose/Rate Route Frequency Ordered Stop   10/05/14 1200  vancomycin (VANCOCIN) IVPB 1000 mg/200 mL premix     1,000 mg200 mL/hr over 60 Minutes Intravenous Every M-W-F (Hemodialysis) 10/03/14 1057     10/04/14 1800  vancomycin (VANCOCIN) IVPB 1000 mg/200 mL premix     1,000 mg200 mL/hr over 60 Minutes Intravenous  Once 10/03/14 1057 10/04/14 1503   10/02/14 2000  vancomycin (VANCOCIN) IVPB 1000 mg/200 mL premix     1,000 mg200 mL/hr over 60 Minutes Intravenous  Once 10/02/14 1127 10/02/14 2039   10/02/14 1300  ceFAZolin (ANCEF) IVPB 2 g/50 mL premix    Comments:  Hang ON CALL to xray 11/3   2 g100 mL/hr over 30 Minutes Intravenous On call 10/02/14 1251 10/02/14 1540   10/01/14 1200  vancomycin (VANCOCIN) IVPB 1000 mg/200 mL premix  Status:  Discontinued     1,000 mg200 mL/hr over 60 Minutes Intravenous Every M-W-F (Hemodialysis) 09/30/14 1619 09/30/14 1622   10/01/14 1200  ceFAZolin (ANCEF) IVPB 2 g/50 mL premix  Status:  Discontinued     2 g100 mL/hr over 30 Minutes Intravenous Every M-W-F (Hemodialysis) 09/30/14 1619 09/30/14 1622   09/30/14 1700  ceFAZolin (ANCEF) IVPB 2 g/50 mL premix     2 g100 mL/hr over 30 Minutes Intravenous  Once 09/30/14 1619 09/30/14 1811   09/28/14 2000  piperacillin-tazobactam (ZOSYN) IVPB 2.25 g  Status:  Discontinued     2.25 g100 mL/hr over 30 Minutes Intravenous Every 8 hours 09/28/14 1905 09/30/14 1610   09/28/14 1915  vancomycin (VANCOCIN) 2,000 mg in sodium chloride 0.9 % 500 mL IVPB     2,000 mg250 mL/hr over 120 Minutes Intravenous  Once 09/28/14 1905 09/28/14 2208      Medications: Scheduled Meds: . aspirin  325 mg Oral Daily  . carvedilol  6.25 mg Oral BID WC  . cloNIDine  0.1 mg Oral TID  . famotidine  20 mg Oral Daily  .  gabapentin  100 mg Oral TID  . heparin  5,000 Units Subcutaneous 3 times per day  . Influenza vac split quadrivalent PF  0.5 mL Intramuscular Tomorrow-1000  . insulin aspart  0-20 Units Subcutaneous TID WC  . insulin aspart  0-5 Units Subcutaneous QHS  . insulin glargine  13 Units Subcutaneous Daily  . lisinopril  40 mg Oral QPM  . sevelamer carbonate  1,600 mg Oral TID WC  . sodium chloride  3 mL Intravenous Q12H  . [START ON 10/05/2014] vancomycin  1,000 mg Intravenous Q M,W,F-HD  . Vitamin D (Ergocalciferol)  50,000 Units Oral Q7 days   Continuous Infusions:   PRN Meds:.sodium chloride, sodium chloride, acetaminophen **OR** acetaminophen, alteplase, feeding supplement (NEPRO CARB STEADY), heparin, [START ON 10/05/2014] heparin, lidocaine (PF), lidocaine-prilocaine, oxyCODONE, pentafluoroprop-tetrafluoroeth    Objective: Weight change: -3 lb 5 oz (-1.503 kg)  Intake/Output Summary (Last 24 hours) at 10/04/14 1706 Last data filed at 10/04/14 1301  Gross per 24 hour  Intake    665 ml  Output   3000 ml  Net  -2335 ml   Blood pressure 136/75, pulse 97, temperature 98.4 F (36.9 C), temperature source Oral, resp. rate 19, height 6' (1.829 m), weight 249 lb 12.5 oz (  113.3 kg), SpO2 96 %. Temp:  [98.2 F (36.8 C)-98.9 F (37.2 C)] 98.4 F (36.9 C) (11/05 1442) Pulse Rate:  [96-113] 97 (11/05 1442) Resp:  [15-24] 19 (11/05 1442) BP: (136-189)/(75-112) 136/75 mmHg (11/05 1442) SpO2:  [94 %-96 %] 96 % (11/05 1442) Weight:  [249 lb 12.5 oz (113.3 kg)-256 lb 6.3 oz (116.3 kg)] 249 lb 12.5 oz (113.3 kg) (11/05 1139)  Physical Exam: General: Alert and awake, oriented x3, not in any acute distress. HEENT: anicteric sclera,  EOMI CVS regular rate, normal r,  no murmur rubs or gallops Chest: clear to auscultation bilaterally, no wheezing, rales or rhonchi Abdomen: soft nontender, nondistended, normal bowel sounds, Extremities: no  clubbing or edema noted bilaterally Skin: no  rashes Neuro: nonfocal  CBC: CBC Latest Ref Rng 10/04/2014 10/03/2014 10/02/2014  WBC 4.0 - 10.5 K/uL 9.6 9.9 11.4(H)  Hemoglobin 13.0 - 17.0 g/dL 8.9(L) 9.6(L) 9.1(L)  Hematocrit 39.0 - 52.0 % 28.1(L) 29.8(L) 27.7(L)  Platelets 150 - 400 K/uL 351 304 348     BMET  Recent Labs  10/03/14 0121 10/04/14 0737  NA 135* 139  K 4.3 4.8  CL 96 97  CO2 23 22  GLUCOSE 110* 76  BUN 33* 45*  CREATININE 6.94* 9.59*  CALCIUM 8.8 9.0     Liver Panel   Recent Labs  10/02/14 1643 10/04/14 0737  ALBUMIN 2.2* 2.3*       Sedimentation Rate  Recent Labs  10/02/14 0541  ESRSEDRATE 98*   C-Reactive Protein  Recent Labs  10/02/14 0541  CRP 24.4*    Micro Results: Recent Results (from the past 720 hour(s))  Culture, blood (routine x 2)     Status: None   Collection Time: 09/28/14  6:48 PM  Result Value Ref Range Status   Specimen Description BLOOD RIGHT ARM  Final   Special Requests BOTTLES DRAWN AEROBIC AND ANAEROBIC 5CC  Final   Culture  Setup Time   Final    09/28/2014 22:33 Performed at [facility]    Culture   Final    STAPHYLOCOCCUS AUREUS Note: SUSCEPTIBILITIES PERFORMED ON PREVIOUS CULTURE WITHIN THE LAST 5 DAYS. Note: Gram Stain Report Called to,Read Back By and Verified With: KAITLYN REID 09/29/14 @ 12:21PM BY RUSCOE A. Performed at [facility]    Report Status 10/01/2014 FINAL  Final  Culture, blood (routine x 2)     Status: None   Collection Time: 09/28/14  7:29 PM  Result Value Ref Range Status   Specimen Description BLOOD HAND RIGHT  Final   Special Requests BOTTLES DRAWN AEROBIC AND ANAEROBIC 5CC  Final   Culture  Setup Time   Final    09/29/2014 01:20 Performed at [facility]    Culture   Final    METHICILLIN RESISTANT STAPHYLOCOCCUS AUREUS Note: RIFAMPIN AND GENTAMICIN SHOULD NOT BE USED AS SINGLE DRUGS FOR TREATMENT OF STAPH INFECTIONS. CRITICAL RESULT CALLED TO, READ BACK BY AND VERIFIED WITH: RICKY OUSTERMAN  09/30/14 @ 8:17PM BY RUSCOE A. Note: Gram Stain Report Called to,Read Back By and Verified With: KAITLYN REID 09/29/14 @ 2:59PM BY RUSCOE A. Performed at [facility]    Report Status 10/01/2014 FINAL  Final   Organism ID, Bacteria METHICILLIN RESISTANT STAPHYLOCOCCUS AUREUS  Final      Susceptibility   Methicillin resistant staphylococcus aureus - MIC*    CLINDAMYCIN >=8 RESISTANT Resistant     ERYTHROMYCIN >=8 RESISTANT Resistant     GENTAMICIN <=0.5 SENSITIVE Sensitive  LEVOFLOXACIN >=8 RESISTANT Resistant     OXACILLIN >=4 RESISTANT Resistant     PENICILLIN >=0.5 RESISTANT Resistant     RIFAMPIN <=0.5 SENSITIVE Sensitive     TRIMETH/SULFA <=10 SENSITIVE Sensitive     VANCOMYCIN 1 SENSITIVE Sensitive     TETRACYCLINE <=1 SENSITIVE Sensitive     * METHICILLIN RESISTANT STAPHYLOCOCCUS AUREUS  MRSA PCR Screening     Status: Abnormal   Collection Time: 09/28/14 10:03 PM  Result Value Ref Range Status   MRSA by PCR POSITIVE (A) NEGATIVE Final    Comment:        The GeneXpert MRSA Assay (FDA approved for NASAL specimens only), is one component of a comprehensive MRSA colonization surveillance program. It is not intended to diagnose MRSA infection nor to guide or monitor treatment for MRSA infections. RESULT CALLED TO, READ BACK BY AND VERIFIED WITH: CALLED TO RN MICHELLE EVANGELISTA [id number] @[phone]  THANEY  Culture, blood (routine x 2)     Status: None (Preliminary result)   Collection Time: 09/30/14  5:33 PM  Result Value Ref Range Status   Specimen Description BLOOD RIGHT HAND  Final   Special Requests BOTTLES DRAWN AEROBIC ONLY 5CC  Final   Culture  Setup Time   Final    10/01/2014 02:24 Performed at [facility]    Culture   Final           BLOOD CULTURE RECEIVED NO GROWTH TO DATE CULTURE WILL BE HELD FOR 5 DAYS BEFORE ISSUING A FINAL NEGATIVE REPORT Performed at [facility]    Report Status PENDING  Incomplete  Culture, blood (routine x 2)      Status: None (Preliminary result)   Collection Time: 09/30/14  5:39 PM  Result Value Ref Range Status   Specimen Description BLOOD LEFT HAND  Final   Special Requests BOTTLES DRAWN AEROBIC ONLY 5CC  Final   Culture  Setup Time   Final    10/01/2014 02:24 Performed at [facility]    Culture   Final           BLOOD CULTURE RECEIVED NO GROWTH TO DATE CULTURE WILL BE HELD FOR 5 DAYS BEFORE ISSUING A FINAL NEGATIVE REPORT Performed at [facility]    Report Status PENDING  Incomplete  Urine culture     Status: None   Collection Time: 10/01/14  3:16 AM  Result Value Ref Range Status   Specimen Description URINE, RANDOM  Final   Special Requests NONE  Final   Culture  Setup Time   Final    10/01/2014 09:41 Performed at [place]   Final    6,000 COLONIES/ML Performed at [facility]    Culture   Final    INSIGNIFICANT GROWTH Performed at [facility]    Report Status 10/02/2014 FINAL  Final    Studies/Results: No results found.    Assessment/Plan:  Principal Problem:   CRBSI (catheter-related bloodstream infection) Active Problems:   Sepsis   Line sepsis   ESRD (end stage renal disease) on dialysis   DM (diabetes mellitus)   HTN (hypertension)   SVT (supraventricular tachycardia)   Atrial fibrillation   Type 2 diabetes mellitus with diabetic chronic kidney disease   Staphylococcus aureus bacteremia with sepsis   Spinal stenosis of lumbar region   Screen for STD (sexually transmitted disease)   Left leg weakness   Septic arthritis of sacroiliac joint    Noah Bounds  Vang is a 58 y.o. male with  MRSA bacteremia, ESRD on HD sp removal of HD line. He has had severe back pain and MRI shows spinal analysis but no clear-cut infection transesophageal echocardiogram is pending  #1      [facility] Staphylococcus aureus bacteremia   Staphylococcus aureus bacteremia (SAB) is  associated with a high rate of complications and mortality.  Specific aspects of clinical management are critical to optimizing the outcome of patients with SAB.  Therefore, the [facility] Antimicrobial Management Team [facility]) has initiated an intervention aimed at improving the management of SAB at [facility].  To do so, Infectious Diseases physicians are providing an evidence-based consult for the management of all patients with SAB.     Yes No Comments  Perform follow-up blood cultures (even if the patient is afebrile) to ensure clearance of bacteremia [x]  []  Blood cultures from 09/30/14 cooking and NGTD  Remove vascular catheter and obtain follow-up blood cultures after the removal of the catheter [x]  []  HD out   Perform echocardiography to evaluate for endocarditis (transthoracic ECHO is 40-50% sensitive, TEE is > 90% sensitive) []  []  TEE NO VEGETATIONS  Consult electrophysiologist to evaluate implanted cardiac device (pacemaker, ICD) []  []  NA  Ensure source control [x]  []  Have all abscesses been drained effectively? Have deep seeded infections (septic joints or osteomyelitis) had appropriate surgical debridement?  HE IS GOING TO HAVE SI JOINT ASPIRATED BY IR  Investigate for "metastatic" sites of infection [x]  []  Does the patient have ANY symptom or physical exam finding that would suggest a deeper infection (back or neck pain that may be suggestive of vertebral osteomyelitis or epidural abscess, muscle pain that could be a symptom of pyomyositis)?  Keep in mind that for deep seeded infections MRI imaging with contrast is preferred rather than other often insensitive tests such as plain x-rays, especially early in a patient's presentation.  MRI  SHOWS SACRO-ILIAC INFECTION  Change antibiotic therapy to VANCOMYCIN []  []  Beta-lactam antibiotics are preferred for MSSA due to higher cure rates.   If on Vancomycin, goal trough should be 15 - 20 mcg/mL  Estimated duration of IV antibiotic therapy:   8 WEEKS GIVEN SACRO-ILIAC INFECTION []  []  Consult case management for probably prolonged outpatient IV antibiotic therapy    Agree with SI joint aspiration though doubt cultures will show anything. Agree with protracted antibiotics and may indeed need repeat MRI hip and L spine prior to stopping abx  Dr. Linus Salmons is covering for me tomorrow and Dr. Megan Salon will be here this weekend. I will be back on Monday.   LOS: 6 days   Alcide Evener 10/04/2014, 5:06 PM

## 2014-10-04 NOTE — Progress Notes (Signed)
[facility] PROGRESS NOTE  Noah Vang YME:[id number] DOB: [birth year] DOA: 09/28/2014 PCP: No PCP Per Patient  Assessment/Plan: 58 y.o. male with PMH of DM, HTN, ESRD on HD sent to the ED from dialysis with tachycardia, fever, chills, hypertension -admitted with bacteremia, cath infection   1. Sepsis due to MRSA bacteremia- most likely source is his RIJ tunneled dialysis catheter  -initial BCx + 2/2 MRSA; repeated 11/1- NGTD; TEE: no Vegs; IR pulled infected catheter 10/31 -cont Vanc per ID; awaiting IR SI joint drainage   2. DJD hips BL; Spinal stenopsis;  -MRI of spine shows: Moderate to severe spinal stenosis at L4-5 with grade 1 anterior slip of L4 and L5. L5 nerve root impingementbilaterally -- follow up outpatient- PRN pain meds 3. Probable septic joint SI; MRI: Abnormal edema along the left sacroiliac joint with a small SI joint effusion and edema tracking along adjacent muscular and tissue planes. Appearance concerning for septic left sacroiliac joint -ordered IR aspiration; cont atx, appreciate ID, ortho input  4. HTN uncontrolled; change labetalol to carvedilol; resume clonidine; cont ACE; prn hydralazine;  5. ESRD - appreciate nephrology consulted- L HD access placed 11/3; cont HD  6. DM - continue home lantus; check ha1c 7. Paroxysmal Atrial fib - control with BB, added ASA -Discussed further anticoagulation with patient- He declined anticoagulation     Code Status: full Family Communication:  D/w patient, no family at the bedside (indicate person spoken with, relationship, and if by phone, the number) Disposition Plan: pend clinical improvement    Consultants:  IR  Nephrology  ID  Cards (tee)  [name])  Procedures:  IR- catheter pulled  TEE Antibiotics:  vanc  11/1>>>  (indicate start date, and stop date if known)  HPI/Subjective: alert  Objective: Filed Vitals:   10/04/14 1139  BP: 165/95  Pulse: 104  Temp: 98.7 F (37.1  C)  Resp: 24    Intake/Output Summary (Last 24 hours) at 10/04/14 1407 Last data filed at 10/04/14 1301  Gross per 24 hour  Intake    785 ml  Output   3000 ml  Net  -2215 ml   Filed Weights   10/04/14 0324 10/04/14 0722 10/04/14 1139  Weight: 114.3 kg (251 lb 15.8 oz) 116.3 kg (256 lb 6.3 oz) 113.3 kg (249 lb 12.5 oz)    Exam:   General:  alert  Cardiovascular: s1,s2 rrr  Respiratory: CTA BL  Abdomen: soft, nt,nd   Musculoskeletal: no LE edema   Data Reviewed: Basic Metabolic Panel:  Recent Labs Lab 09/30/14 0147 10/01/14 0250 10/02/14 1643 10/03/14 0121 10/04/14 0737  NA 134* 135* 134* 135* 139  K 4.4 4.6 5.0 4.3 4.8  CL 95* 93* 92* 96 97  CO2 _0 GLUCOSE 159* 129* 126* 110* 76  BUN 54* 64* 78* 33* 45*  CREATININE 8.99* 10.73* 13.23* 6.94* 9.59*  CALCIUM 8.9 8.9 8.7 8.8 9.0  PHOS  --   --  6.2*  --  7.0*   Liver Function Tests:  Recent Labs Lab 09/28/14 1817 10/02/14 1643 10/04/14 0737  AST 31  --   --   ALT 24  --   --   ALKPHOS 129*  --   --   BILITOT 0.7  --   --   PROT 8.3  --   --   ALBUMIN 3.2* 2.2* 2.3*   No results for input(s): LIPASE, AMYLASE in the last 168 hours. No results for input(s): AMMONIA in  the last 168 hours. CBC:  Recent Labs Lab 09/28/14 1817  09/30/14 0147 10/01/14 0250 10/02/14 1615 10/03/14 0121 10/04/14 0737  WBC 16.9*  < > 12.0* 9.6 11.4* 9.9 9.6  NEUTROABS 14.6*  --   --   --   --   --   --   HGB 11.4*  < > 9.7* 11.4* 9.1* 9.6* 8.9*  HCT 34.3*  < > 29.6* 36.0* 27.7* 29.8* 28.1*  MCV 83.5  < > 80.9 80.9 81.0 82.1 83.4  PLT 139*  < > 139* 190 348 304 351  < > = values in this interval not displayed. Cardiac Enzymes:  Recent Labs Lab 09/30/14 1358 09/30/14 1931 10/01/14 0250 10/01/14 0856 10/01/14 1337  TROPONINI <0.30 <0.30 <0.30 <0.30 <0.30   BNP (last 3 results) No results for input(s): PROBNP in the last 8760 hours. CBG:  Recent Labs Lab 10/03/14 1141 10/03/14 1651  10/03/14 2105 10/04/14 0642 10/04/14 1202  GLUCAP 100* 71 74 75 88    Recent Results (from the past 240 hour(s))  Culture, blood (routine x 2)     Status: None   Collection Time: 09/28/14  6:48 PM  Result Value Ref Range Status   Specimen Description BLOOD RIGHT ARM  Final   Special Requests BOTTLES DRAWN AEROBIC AND ANAEROBIC 5CC  Final   Culture  Setup Time   Final    09/28/2014 22:33 Performed at [facility]    Culture   Final    STAPHYLOCOCCUS AUREUS Note: SUSCEPTIBILITIES PERFORMED ON PREVIOUS CULTURE WITHIN THE LAST 5 DAYS. Note: Gram Stain Report Called to,Read Back By and Verified With: KAITLYN REID 09/29/14 @ 12:21PM BY RUSCOE A. Performed at [facility]    Report Status 10/01/2014 FINAL  Final  Culture, blood (routine x 2)     Status: None   Collection Time: 09/28/14  7:29 PM  Result Value Ref Range Status   Specimen Description BLOOD HAND RIGHT  Final   Special Requests BOTTLES DRAWN AEROBIC AND ANAEROBIC 5CC  Final   Culture  Setup Time   Final    09/29/2014 01:20 Performed at [facility]    Culture   Final    METHICILLIN RESISTANT STAPHYLOCOCCUS AUREUS Note: RIFAMPIN AND GENTAMICIN SHOULD NOT BE USED AS SINGLE DRUGS FOR TREATMENT OF STAPH INFECTIONS. CRITICAL RESULT CALLED TO, READ BACK BY AND VERIFIED WITH: RICKY OUSTERMAN 09/30/14 @ 8:17PM BY RUSCOE A. Note: Gram Stain Report Called to,Read Back By and Verified With: KAITLYN REID 09/29/14 @ 2:59PM BY RUSCOE A. Performed at [facility]    Report Status 10/01/2014 FINAL  Final   Organism ID, Bacteria METHICILLIN RESISTANT STAPHYLOCOCCUS AUREUS  Final      Susceptibility   Methicillin resistant staphylococcus aureus - MIC*    CLINDAMYCIN >=8 RESISTANT Resistant     ERYTHROMYCIN >=8 RESISTANT Resistant     GENTAMICIN <=0.5 SENSITIVE Sensitive     LEVOFLOXACIN >=8 RESISTANT Resistant     OXACILLIN >=4 RESISTANT Resistant     PENICILLIN >=0.5 RESISTANT Resistant     RIFAMPIN  <=0.5 SENSITIVE Sensitive     TRIMETH/SULFA <=10 SENSITIVE Sensitive     VANCOMYCIN 1 SENSITIVE Sensitive     TETRACYCLINE <=1 SENSITIVE Sensitive     * METHICILLIN RESISTANT STAPHYLOCOCCUS AUREUS  MRSA PCR Screening     Status: Abnormal   Collection Time: 09/28/14 10:03 PM  Result Value Ref Range Status   MRSA by PCR POSITIVE (A) NEGATIVE Final    Comment:  The GeneXpert MRSA Assay (FDA approved for NASAL specimens only), is one component of a comprehensive MRSA colonization surveillance program. It is not intended to diagnose MRSA infection nor to guide or monitor treatment for MRSA infections. RESULT CALLED TO, READ BACK BY AND VERIFIED WITH: CALLED TO RN MICHELLE EVANGELISTA [id number] _0  THANEY  Culture, blood (routine x 2)     Status: None (Preliminary result)   Collection Time: 09/30/14  5:33 PM  Result Value Ref Range Status   Specimen Description BLOOD RIGHT HAND  Final   Special Requests BOTTLES DRAWN AEROBIC ONLY 5CC  Final   Culture  Setup Time   Final    10/01/2014 02:24 Performed at [facility]    Culture   Final           BLOOD CULTURE RECEIVED NO GROWTH TO DATE CULTURE WILL BE HELD FOR 5 DAYS BEFORE ISSUING A FINAL NEGATIVE REPORT Performed at [facility]    Report Status PENDING  Incomplete  Culture, blood (routine x 2)     Status: None (Preliminary result)   Collection Time: 09/30/14  5:39 PM  Result Value Ref Range Status   Specimen Description BLOOD LEFT HAND  Final   Special Requests BOTTLES DRAWN AEROBIC ONLY 5CC  Final   Culture  Setup Time   Final    10/01/2014 02:24 Performed at [facility]    Culture   Final           BLOOD CULTURE RECEIVED NO GROWTH TO DATE CULTURE WILL BE HELD FOR 5 DAYS BEFORE ISSUING A FINAL NEGATIVE REPORT Performed at [facility]    Report Status PENDING  Incomplete  Urine culture     Status: None   Collection Time: 10/01/14  3:16 AM  Result Value Ref Range Status   Specimen  Description URINE, RANDOM  Final   Special Requests NONE  Final   Culture  Setup Time   Final    10/01/2014 09:41 Performed at [facility]   Final    6,000 COLONIES/ML Performed at [facility]    Culture   Final    INSIGNIFICANT GROWTH Performed at [facility]    Report Status 10/02/2014 FINAL  Final     Studies: Ir Fluoro Guide Cv Line Left  10/02/2014   INDICATION: History of right internal jugular approach tunneled dialysis catheter placement on 08/21/2014, admitted with bacteremia with subsequent removal of the dialysis catheter on 09/29/2014. Patient has remained and antibiotics with normalization of white blood cell count and has remained afebrile for the past 48 hours. Request made for placement of a new tunneled dialysis catheter for continuation of dialysis.  EXAM: TUNNELED CENTRAL VENOUS HEMODIALYSIS CATHETER PLACEMENT WITH ULTRASOUND AND FLUOROSCOPIC GUIDANCE  MEDICATIONS: Ancef 2 gm IV; The IV antibiotic was given in an appropriate time interval prior to skin puncture.  CONTRAST:  None  ANESTHESIA/SEDATION: Versed 1 mg IV; Fentanyl 50 mcg IV  Total Moderate Sedation Time  11 minutes.  FLUOROSCOPY TIME:  54 seconds.  COMPLICATIONS: None immediate  PROCEDURE: Informed written consent was obtained from the patient after a discussion of the risks, benefits, and alternatives to treatment. Questions regarding the procedure were encouraged and answered.  Given concern for recent infection involving the right internal jugular approach dialysis catheter, the decision was made to place a left internal jugular approach dialysis catheter. As such, the left neck and chest were prepped with chlorhexidine in a sterile  fashion, and a sterile drape was applied covering the operative field. Maximum barrier sterile technique with sterile gowns and gloves were used for the procedure. A timeout was performed prior to the initiation of the procedure.  After  creating a small venotomy incision, a micropuncture kit was utilized to access the left internal jugular vein under direct, real-time ultrasound guidance after the overlying soft tissues were anesthetized with 1% lidocaine with epinephrine. Ultrasound image documentation was performed. The microwire was kinked to measure appropriate catheter length. A stiff Glidewire was advanced to the level of the IVC and the micropuncture sheath was exchanged for a peel-away sheath. A Palindrome tunneled hemodialysis catheter measuring 23 cm from tip to cuff was tunneled in a retrograde fashion from the anterior chest wall to the venotomy incision.  The catheter was then placed through the peel-away sheath with tips ultimately positioned within the superior aspect of the right atrium. Final catheter positioning was confirmed and documented with a spot radiographic image. The catheter aspirates and flushes normally. The catheter was flushed with appropriate volume heparin dwells.  The catheter exit site was secured with a 0-Prolene retention suture. The venotomy incision was closed with an interrupted 4-0 Vicryl, Dermabond and Steri-strips. Dressings were applied. The patient tolerated the procedure well without immediate post procedural complication.  IMPRESSION: Successful placement of 23 cm tip to cuff tunneled hemodialysis catheter via the left internal jugular vein with tips terminating within the superior aspect of the right atrium. The catheter is ready for immediate use.   Electronically Signed   By: Sandi Mariscal M.D.   On: 10/02/2014 16:07   Ir US Guide Vasc Access Left  10/02/2014   INDICATION: History of right internal jugular approach tunneled dialysis catheter placement on 08/21/2014, admitted with bacteremia with subsequent removal of the dialysis catheter on 09/29/2014. Patient has remained and antibiotics with normalization of white blood cell count and has remained afebrile for the past 48 hours. Request made  for placement of a new tunneled dialysis catheter for continuation of dialysis.  EXAM: TUNNELED CENTRAL VENOUS HEMODIALYSIS CATHETER PLACEMENT WITH ULTRASOUND AND FLUOROSCOPIC GUIDANCE  MEDICATIONS: Ancef 2 gm IV; The IV antibiotic was given in an appropriate time interval prior to skin puncture.  CONTRAST:  None  ANESTHESIA/SEDATION: Versed 1 mg IV; Fentanyl 50 mcg IV  Total Moderate Sedation Time  11 minutes.  FLUOROSCOPY TIME:  54 seconds.  COMPLICATIONS: None immediate  PROCEDURE: Informed written consent was obtained from the patient after a discussion of the risks, benefits, and alternatives to treatment. Questions regarding the procedure were encouraged and answered.  Given concern for recent infection involving the right internal jugular approach dialysis catheter, the decision was made to place a left internal jugular approach dialysis catheter. As such, the left neck and chest were prepped with chlorhexidine in a sterile fashion, and a sterile drape was applied covering the operative field. Maximum barrier sterile technique with sterile gowns and gloves were used for the procedure. A timeout was performed prior to the initiation of the procedure.  After creating a small venotomy incision, a micropuncture kit was utilized to access the left internal jugular vein under direct, real-time ultrasound guidance after the overlying soft tissues were anesthetized with 1% lidocaine with epinephrine. Ultrasound image documentation was performed. The microwire was kinked to measure appropriate catheter length. A stiff Glidewire was advanced to the level of the IVC and the micropuncture sheath was exchanged for a peel-away sheath. A Palindrome tunneled hemodialysis catheter measuring 23 cm from  tip to cuff was tunneled in a retrograde fashion from the anterior chest wall to the venotomy incision.  The catheter was then placed through the peel-away sheath with tips ultimately positioned within the superior aspect of the  right atrium. Final catheter positioning was confirmed and documented with a spot radiographic image. The catheter aspirates and flushes normally. The catheter was flushed with appropriate volume heparin dwells.  The catheter exit site was secured with a 0-Prolene retention suture. The venotomy incision was closed with an interrupted 4-0 Vicryl, Dermabond and Steri-strips. Dressings were applied. The patient tolerated the procedure well without immediate post procedural complication.  IMPRESSION: Successful placement of 23 cm tip to cuff tunneled hemodialysis catheter via the left internal jugular vein with tips terminating within the superior aspect of the right atrium. The catheter is ready for immediate use.   Electronically Signed   By: Sandi Mariscal M.D.   On: 10/02/2014 16:07    Scheduled Meds: . aspirin  325 mg Oral Daily  . famotidine  20 mg Oral Daily  . gabapentin  100 mg Oral TID  . heparin  5,000 Units Subcutaneous 3 times per day  . Influenza vac split quadrivalent PF  0.5 mL Intramuscular Tomorrow-1000  . insulin aspart  0-20 Units Subcutaneous TID WC  . insulin aspart  0-5 Units Subcutaneous QHS  . insulin glargine  13 Units Subcutaneous Daily  . labetalol  300 mg Oral BID  . lisinopril  40 mg Oral QPM  . sevelamer carbonate  1,600 mg Oral TID WC  . sodium chloride  3 mL Intravenous Q12H  . vancomycin  1,000 mg Intravenous Once  . [START ON 10/05/2014] vancomycin  1,000 mg Intravenous Q M,W,F-HD  . Vitamin D (Ergocalciferol)  50,000 Units Oral Q7 days   Continuous Infusions:   Principal Problem:   CRBSI (catheter-related bloodstream infection) Active Problems:   Sepsis   Line sepsis   ESRD (end stage renal disease) on dialysis   DM (diabetes mellitus)   HTN (hypertension)   SVT (supraventricular tachycardia)   Atrial fibrillation   Type 2 diabetes mellitus with diabetic chronic kidney disease   Staphylococcus aureus bacteremia with sepsis   Spinal stenosis of lumbar  region   Screen for STD (sexually transmitted disease)   Left leg weakness   Septic arthritis of sacroiliac joint    Time spent: >35 minutes     Kinnie Feil  [facility] Pager [phone]. If 7PM-7AM, please contact night-coverage at [email or web address], password [facility] 10/04/2014, 2:07 PM  LOS: 6 days

## 2014-10-04 NOTE — Procedures (Addendum)
I was present at this dialysis session, have reviewed the session itself and made  appropriate changes.   Has fluid around SI joint and possible septic arthritis of same joint.  Seen by ortho and ID, attempt to get fluid by IR planned. Prob will need longterm abx.  Defervesced now on Rx for MRSA cath related bacteremia. Debility.  On HD now, no complaints. L leg seems more mobile and not as much pain with ROM. Have ordered PT consult.    Kelly Splinter MD (pgr) [phone]    ([phone] 10/04/2014, 10:09 AM

## 2014-10-05 DIAGNOSIS — E1121 Type 2 diabetes mellitus with diabetic nephropathy: Secondary | ICD-10-CM

## 2014-10-05 DIAGNOSIS — R29898 Other symptoms and signs involving the musculoskeletal system: Secondary | ICD-10-CM

## 2014-10-05 LAB — GLUCOSE, CAPILLARY
GLUCOSE-CAPILLARY: 90 mg/dL (ref 70–99)
GLUCOSE-CAPILLARY: 65 mg/dL — AB (ref 70–99)
Glucose-Capillary: 183 mg/dL — ABNORMAL HIGH (ref 70–99)
Glucose-Capillary: 71 mg/dL (ref 70–99)
Glucose-Capillary: 102 mg/dL — ABNORMAL HIGH (ref 70–99)
Glucose-Capillary: 138 mg/dL — ABNORMAL HIGH (ref 70–99)

## 2014-10-05 MED ORDER — INSULIN GLARGINE 100 UNIT/ML ~~LOC~~ SOLN
5.0000 [IU] | Freq: Every day | SUBCUTANEOUS | Status: DC
  Administered 2014-10-06: 5 [IU] via SUBCUTANEOUS
  Filled 2014-10-05: qty 0.05

## 2014-10-05 MED ORDER — DARBEPOETIN ALFA 40 MCG/0.4ML IJ SOSY
40.0000 ug | PREFILLED_SYRINGE | INTRAMUSCULAR | Status: DC
  Administered 2014-10-06: 40 ug via INTRAVENOUS
  Filled 2014-10-05: qty 0.4

## 2014-10-05 MED ORDER — VANCOMYCIN HCL IN DEXTROSE 1-5 GM/200ML-% IV SOLN: 1000.0000 mg | INTRAVENOUS | Status: DC

## 2014-10-05 NOTE — Progress Notes (Signed)
ANTIBIOTIC CONSULT NOTE - FOLLOW UP  Pharmacy Consult for vancomycin Indication: MRSA bacteremia  No Known Allergies  Patient Measurements: Height: 6' (182.9 cm) Weight: 247 lb 6.4 oz (112.22 kg) (bed scale) IBW/kg (Calculated) : 77.6  Vital Signs: Temp: 98.7 F (37.1 C) (11/06 0800) Temp Source: Oral (11/06 0800) BP: 157/88 mmHg (11/06 0800) Pulse Rate: 96 (11/06 0800) Intake/Output from previous day: 11/05 0701 - 11/06 0700 In: 683 [P.O.:680; I.V.:3] Out: 3100 [Urine:100] Intake/Output from this shift: Total I/O In: 680 [P.O.:480; IV Piggyback:200] Out: 0   Labs:  Recent Labs  10/02/14 1615 10/02/14 1643 10/03/14 0121 10/04/14 0737  WBC 11.4*  --  9.9 9.6  HGB 9.1*  --  9.6* 8.9*  PLT 348  --  304 351  CREATININE  --  13.23* 6.94* 9.59*   Estimated Creatinine Clearance: 11.1 mL/min (by C-G formula based on Cr of 9.59). No results for input(s): VANCOTROUGH, VANCOPEAK, VANCORANDOM, GENTTROUGH, GENTPEAK, GENTRANDOM, TOBRATROUGH, TOBRAPEAK, TOBRARND, AMIKACINPEAK, AMIKACINTROU, AMIKACIN in the last 72 hours.   Microbiology: Recent Results (from the past 720 hour(s))  Culture, blood (routine x 2)     Status: None   Collection Time: 09/28/14  6:48 PM  Result Value Ref Range Status   Specimen Description BLOOD RIGHT ARM  Final   Special Requests BOTTLES DRAWN AEROBIC AND ANAEROBIC 5CC  Final   Culture  Setup Time   Final    09/28/2014 22:33 Performed at [facility]    Culture   Final    STAPHYLOCOCCUS AUREUS Note: SUSCEPTIBILITIES PERFORMED ON PREVIOUS CULTURE WITHIN THE LAST 5 DAYS. Note: Gram Stain Report Called to,Read Back By and Verified With: KAITLYN REID 09/29/14 @ 12:21PM BY RUSCOE A. Performed at [facility]    Report Status 10/01/2014 FINAL  Final  Culture, blood (routine x 2)     Status: None   Collection Time: 09/28/14  7:29 PM  Result Value Ref Range Status   Specimen Description BLOOD HAND RIGHT  Final   Special Requests  BOTTLES DRAWN AEROBIC AND ANAEROBIC 5CC  Final   Culture  Setup Time   Final    09/29/2014 01:20 Performed at [facility]    Culture   Final    METHICILLIN RESISTANT STAPHYLOCOCCUS AUREUS Note: RIFAMPIN AND GENTAMICIN SHOULD NOT BE USED AS SINGLE DRUGS FOR TREATMENT OF STAPH INFECTIONS. CRITICAL RESULT CALLED TO, READ BACK BY AND VERIFIED WITH: RICKY OUSTERMAN 09/30/14 @ 8:17PM BY RUSCOE A. Note: Gram Stain Report Called to,Read Back By and Verified With: KAITLYN REID 09/29/14 @ 2:59PM BY RUSCOE A. Performed at [facility]    Report Status 10/01/2014 FINAL  Final   Organism ID, Bacteria METHICILLIN RESISTANT STAPHYLOCOCCUS AUREUS  Final      Susceptibility   Methicillin resistant staphylococcus aureus - MIC*    CLINDAMYCIN >=8 RESISTANT Resistant     ERYTHROMYCIN >=8 RESISTANT Resistant     GENTAMICIN <=0.5 SENSITIVE Sensitive     LEVOFLOXACIN >=8 RESISTANT Resistant     OXACILLIN >=4 RESISTANT Resistant     PENICILLIN >=0.5 RESISTANT Resistant     RIFAMPIN <=0.5 SENSITIVE Sensitive     TRIMETH/SULFA <=10 SENSITIVE Sensitive     VANCOMYCIN 1 SENSITIVE Sensitive     TETRACYCLINE <=1 SENSITIVE Sensitive     * METHICILLIN RESISTANT STAPHYLOCOCCUS AUREUS  MRSA PCR Screening     Status: Abnormal   Collection Time: 09/28/14 10:03 PM  Result Value Ref Range Status   MRSA by PCR POSITIVE (A) NEGATIVE Final  Comment:        The GeneXpert MRSA Assay (FDA approved for NASAL specimens only), is one component of a comprehensive MRSA colonization surveillance program. It is not intended to diagnose MRSA infection nor to guide or monitor treatment for MRSA infections. RESULT CALLED TO, READ BACK BY AND VERIFIED WITH: CALLED TO RN MICHELLE EVANGELISTA [id number] @0243  THANEY  Culture, blood (routine x 2)     Status: None (Preliminary result)   Collection Time: 09/30/14  5:33 PM  Result Value Ref Range Status   Specimen Description BLOOD RIGHT HAND  Final   Special  Requests BOTTLES DRAWN AEROBIC ONLY 5CC  Final   Culture  Setup Time   Final    10/01/2014 02:24 Performed at [facility]    Culture   Final           BLOOD CULTURE RECEIVED NO GROWTH TO DATE CULTURE WILL BE HELD FOR 5 DAYS BEFORE ISSUING A FINAL NEGATIVE REPORT Performed at [facility]    Report Status PENDING  Incomplete  Culture, blood (routine x 2)     Status: None (Preliminary result)   Collection Time: 09/30/14  5:39 PM  Result Value Ref Range Status   Specimen Description BLOOD LEFT HAND  Final   Special Requests BOTTLES DRAWN AEROBIC ONLY 5CC  Final   Culture  Setup Time   Final    10/01/2014 02:24 Performed at [facility]    Culture   Final           BLOOD CULTURE RECEIVED NO GROWTH TO DATE CULTURE WILL BE HELD FOR 5 DAYS BEFORE ISSUING A FINAL NEGATIVE REPORT Performed at [facility]    Report Status PENDING  Incomplete  Urine culture     Status: None   Collection Time: 10/01/14  3:16 AM  Result Value Ref Range Status   Specimen Description URINE, RANDOM  Final   Special Requests NONE  Final   Culture  Setup Time   Final    10/01/2014 09:41 Performed at [place]   Final    6,000 COLONIES/ML Performed at [facility]    Culture   Final    INSIGNIFICANT GROWTH Performed at [facility]    Report Status 10/02/2014 FINAL  Final    Anti-infectives    Start     Dose/Rate Route Frequency Ordered Stop   10/08/14 1200  vancomycin (VANCOCIN) IVPB 1000 mg/200 mL premix     1,000 mg200 mL/hr over 60 Minutes Intravenous Every M-W-F (Hemodialysis) 10/05/14 1304     10/05/14 1200  vancomycin (VANCOCIN) IVPB 1000 mg/200 mL premix  Status:  Discontinued     1,000 mg200 mL/hr over 60 Minutes Intravenous Every M-W-F (Hemodialysis) 10/03/14 1057 10/05/14 1304   10/04/14 1800  vancomycin (VANCOCIN) IVPB 1000 mg/200 mL premix     1,000 mg200 mL/hr over 60 Minutes Intravenous  Once 10/03/14 1057  10/04/14 1503   10/02/14 2000  vancomycin (VANCOCIN) IVPB 1000 mg/200 mL premix     1,000 mg200 mL/hr over 60 Minutes Intravenous  Once 10/02/14 1127 10/02/14 2039   10/02/14 1300  ceFAZolin (ANCEF) IVPB 2 g/50 mL premix    Comments:  Hang ON CALL to xray 11/3   2 g100 mL/hr over 30 Minutes Intravenous On call 10/02/14 1251 10/02/14 1540   10/01/14 1200  vancomycin (VANCOCIN) IVPB 1000 mg/200 mL premix  Status:  Discontinued     1,000 mg200 mL/hr over  60 Minutes Intravenous Every M-W-F (Hemodialysis) 09/30/14 1619 09/30/14 1622   10/01/14 1200  ceFAZolin (ANCEF) IVPB 2 g/50 mL premix  Status:  Discontinued     2 g100 mL/hr over 30 Minutes Intravenous Every M-W-F (Hemodialysis) 09/30/14 1619 09/30/14 1622   09/30/14 1700  ceFAZolin (ANCEF) IVPB 2 g/50 mL premix     2 g100 mL/hr over 30 Minutes Intravenous  Once 09/30/14 1619 09/30/14 1811   09/28/14 2000  piperacillin-tazobactam (ZOSYN) IVPB 2.25 g  Status:  Discontinued     2.25 g100 mL/hr over 30 Minutes Intravenous Every 8 hours 09/28/14 1905 09/30/14 1610   09/28/14 1915  vancomycin (VANCOCIN) 2,000 mg in sodium chloride 0.9 % 500 mL IVPB     2,000 mg250 mL/hr over 120 Minutes Intravenous  Once 09/28/14 1905 09/28/14 2208      Assessment: 58 yo male with MRSA bacteremia on vancomycin  Patient with ESRD with new cath placement on 11/3 with HD 11/5 and noted for HD 11/7 then MWF. Noted treatment plans per ID- vancomycin with dialysis for 4 weeks through Nov 28th. Vancomycin 1000mg  was given on 11/6 (no HD plans noted)  10/30 Vancomycin>> 10/30 Zosyn >> 11/1 11/1 Cefazolin>> 11/1  10/30 blood- MRSA   Goal of Therapy:  Pre-HD vancomycin level: 15-25  Plan:  -No vancomycin on 11/7 with vancomycin given today -Continue vancomycin 1000mg  IV MWF on 11/09 -Will follow patient progress  Hildred Laser, Pharm D 10/05/2014 1:10 PM

## 2014-10-05 NOTE — Progress Notes (Signed)
Patient ID: Noah Vang, male   DOB: 09-04-1956, 58 y.o.   MRN: [id number]    Request has been made for L SI jt aspiration Imaging has been reviewed with IR Radiologist Corrie Mckusick MD  Unfortunately, MRI does show inflammation but no area of fluid for aspiration. Will not be able to perform SI jt aspiration  MD aware

## 2014-10-05 NOTE — Progress Notes (Signed)
Patient blood sugar at 71. He is alert and oriented, denied any sx. He is aware of his sugar being low and stated "I know it is low but it doesn't make any different. i don't feel any different." He ate cereals for lunch today with milk and sugar substitutes added. He also just ate a piece of cake from lunch.  BG rechecked of 65.pt is alert and denied any other symptoms. Sprite requested. Will recheck BG.   Ave Filter, RN

## 2014-10-05 NOTE — Evaluation (Signed)
Physical Therapy Evaluation Patient Details Name: Noah Vang MRN: [id number] DOB: 05-Jan-1956 Today's Date: 10/05/2014   History of Present Illness  Pt adm with sepsis due to MRSA bacteremia and septic lt SI joint. PMH - ESRD on HD, HTN, DM  Clinical Impression  Pt admitted with above. Pt currently with functional limitations due to the deficits listed below (see PT Problem List).  Pt will benefit from skilled PT to increase their independence and safety with mobility to allow discharge home with girlfriend. Pt states girlfriend can provide assist at home when needed.    Follow Up Recommendations Home health PT;Supervision for mobility/OOB    Equipment Recommendations  None recommended by PT    Recommendations for Other Services       Precautions / Restrictions Precautions Precautions: Fall      Mobility  Bed Mobility Overal bed mobility: Needs Assistance Bed Mobility: Supine to Sit     Supine to sit: Min assist     General bed mobility comments: assist to bring left leg off of bed.  Transfers Overall transfer level: Needs assistance Equipment used: Rolling walker (2 wheeled);Ambulation equipment used Charlaine Dalton) Transfers: Sit to/from [employer] to Stand: Min assist Stand pivot transfers: Min assist       General transfer comment: Verbal cues for hand placement and assist to bring hips up. Used Stedy initially for pivot to chair to assess tolerance of wt bearing.  Ambulation/Gait Ambulation/Gait assistance: Min guard;Supervision Ambulation Distance (Feet): 40 Feet Assistive device: Rolling walker (2 wheeled) Gait Pattern/deviations: Step-through pattern;Decreased step length - right;Decreased stance time - left;Antalgic Gait velocity: decr Gait velocity interpretation: Below normal speed for age/gender General Gait Details: Pt with good use of upper extremities to decr pain with wt bearing on lt leg.  Stairs            Wheelchair  Mobility    Modified Rankin (Stroke Patients Only)       Balance Overall balance assessment: Needs assistance Sitting-balance support: No upper extremity supported;Feet supported Sitting balance-Leahy Scale: Good     Standing balance support: Bilateral upper extremity supported Standing balance-Leahy Scale: Poor Standing balance comment: support of walker due to pain                             Pertinent Vitals/Pain Pain Assessment: Faces Faces Pain Scale: Hurts little more Pain Location: lt hip Pain Descriptors / Indicators: Grimacing;Guarding Pain Intervention(s): Limited activity within patient's tolerance;Monitored during session;Premedicated before session;Repositioned    Home Living Family/patient expects to be discharged to:: Private residence Living Arrangements: Spouse/significant other Available Help at Discharge: Family Type of Home: House Home Access: Stairs to enter Entrance Stairs-Rails: Right Entrance Stairs-Number of Steps: 2 Home Layout: One level Home Equipment: Environmental consultant - 2 wheels      Prior Function Level of Independence: Independent               Hand Dominance        Extremity/Trunk Assessment   Upper Extremity Assessment: Overall WFL for tasks assessed           Lower Extremity Assessment: Generalized weakness         Communication   Communication: No difficulties  Cognition Arousal/Alertness: Awake/alert Behavior During Therapy: Flat affect Overall Cognitive Status: No family/caregiver present to determine baseline cognitive functioning Area of Impairment: Problem solving             Problem Solving: Slow processing  General Comments      Exercises        Assessment/Plan    PT Assessment Patient needs continued PT services  PT Diagnosis Difficulty walking;Acute pain   PT Problem List Decreased strength;Decreased activity tolerance;Decreased balance;Decreased mobility;Decreased knowledge  of use of DME;Pain  PT Treatment Interventions DME instruction;Gait training;Stair training;Functional mobility training;Therapeutic activities;Therapeutic exercise;Balance training;Patient/family education   PT Goals (Current goals can be found in the Care Plan section) Acute Rehab PT Goals Patient Stated Goal: Return home PT Goal Formulation: With patient Time For Goal Achievement: 10/12/14 Potential to Achieve Goals: Good    Frequency Min 3X/week   Barriers to discharge        Co-evaluation               End of Session Equipment Utilized During Treatment: Gait belt Activity Tolerance: Patient limited by pain Patient left: in chair;with call bell/phone within reach Nurse Communication: Mobility status         Time: [id number] PT Time Calculation (min): 30 min   Charges:   PT Evaluation $Initial PT Evaluation Tier I: 1 Procedure PT Treatments $Gait Training: 23-37 mins   PT G Codes:          Noah Vang 04-Nov-2014, 5:10 PM  [employer] PT [phone]

## 2014-10-05 NOTE — Progress Notes (Signed)
[facility] PROGRESS NOTE  Noah Vang [id number] DOB: 1956-08-31 DOA: 09/28/2014 PCP: No PCP Per Patient  Assessment/Plan: 58 y.o. male with PMH of DM, HTN, ESRD on HD sent to the ED from dialysis with tachycardia, fever, chills, hypertension -admitted with bacteremia, cath infection   1. Sepsis due to MRSA bacteremia- most likely source is his RIJ tunneled dialysis catheter  -initial BCx + 2/2 MRSA; repeated 11/1- NGTD; TEE: no Vegs; IR pulled infected catheter 10/31 -cont Vanc per ID;    2. Probable septic joint SI; MRI: Abnormal edema along the left sacroiliac joint with a small SI joint effusion and edema tracking along adjacent muscular and tissue planes. Appearance concerning for septic left sacroiliac joint -per IR: no area of fluid for aspiration; cont atx, appreciate ID, ortho input  -obtain PT/OT eval  3. DJD hips BL; Spinal stenopsis;  -MRI of spine shows: Moderate to severe spinal stenosis at L4-5 with grade 1 anterior slip of L4 and L5. L5 nerve root impingementbilaterally -- follow up outpatient- PRN pain meds 4. HTN uncontrolled; change labetalol to carvedilol; resume clonidine; cont ACE; prn hydralazine;  5. ESRD - appreciate nephrology consulted- L HD access placed 11/3; cont HD  6. DM check ha1c-5.9; decreased lantus 13-->5 units 11/6;  7. Paroxysmal Atrial fib - control with BB, added ASA -Discussed further anticoagulation with patient- He declined anticoagulation     Code Status: full Family Communication:  D/w patient, no family at the bedside (indicate person spoken with, relationship, and if by phone, the number) Disposition Plan: pend clinical improvement; PT    Consultants:  IR  Nephrology  ID  Cards (tee)  [name])  Procedures:  IR- catheter pulled  TEE Antibiotics:  vanc  11/1>>>  (indicate start date, and stop date if known)  HPI/Subjective: alert  Objective: Filed Vitals:   10/05/14 0800  BP: 157/88   Pulse: 96  Temp: 98.7 F (37.1 C)  Resp: 18    Intake/Output Summary (Last 24 hours) at 10/05/14 1000 Last data filed at 10/05/14 0915  Gross per 24 hour  Intake   1043 ml  Output   3100 ml  Net  -2057 ml   Filed Weights   10/04/14 0722 10/04/14 1139 10/05/14 0644  Weight: 116.3 kg (256 lb 6.3 oz) 113.3 kg (249 lb 12.5 oz) 112.22 kg (247 lb 6.4 oz)    Exam:   General:  alert  Cardiovascular: s1,s2 rrr  Respiratory: CTA BL  Abdomen: soft, nt,nd   Musculoskeletal: no LE edema   Data Reviewed: Basic Metabolic Panel:  Recent Labs Lab 09/30/14 0147 10/01/14 0250 10/02/14 1643 10/03/14 0121 10/04/14 0737  NA 134* 135* 134* 135* 139  K 4.4 4.6 5.0 4.3 4.8  CL 95* 93* 92* 96 97  CO2 21 23 20 23 22   GLUCOSE 159* 129* 126* 110* 76  BUN 54* 64* 78* 33* 45*  CREATININE 8.99* 10.73* 13.23* 6.94* 9.59*  CALCIUM 8.9 8.9 8.7 8.8 9.0  PHOS  --   --  6.2*  --  7.0*   Liver Function Tests:  Recent Labs Lab 09/28/14 1817 10/02/14 1643 10/04/14 0737  AST 31  --   --   ALT 24  --   --   ALKPHOS 129*  --   --   BILITOT 0.7  --   --   PROT 8.3  --   --   ALBUMIN 3.2* 2.2* 2.3*   No results for input(s): LIPASE, AMYLASE in the last 168  hours. No results for input(s): AMMONIA in the last 168 hours. CBC:  Recent Labs Lab 09/28/14 1817  09/30/14 0147 10/01/14 0250 10/02/14 1615 10/03/14 0121 10/04/14 0737  WBC 16.9*  < > 12.0* 9.6 11.4* 9.9 9.6  NEUTROABS 14.6*  --   --   --   --   --   --   HGB 11.4*  < > 9.7* 11.4* 9.1* 9.6* 8.9*  HCT 34.3*  < > 29.6* 36.0* 27.7* 29.8* 28.1*  MCV 83.5  < > 80.9 80.9 81.0 82.1 83.4  PLT 139*  < > 139* 190 348 304 351  < > = values in this interval not displayed. Cardiac Enzymes:  Recent Labs Lab 09/30/14 1358 09/30/14 1931 10/01/14 0250 10/01/14 0856 10/01/14 1337  TROPONINI <0.30 <0.30 <0.30 <0.30 <0.30   BNP (last 3 results) No results for input(s): PROBNP in the last 8760 hours. CBG:  Recent Labs Lab  10/04/14 0642 10/04/14 1202 10/04/14 1639 10/04/14 2156 10/05/14 0639  GLUCAP 75 88 117* 108* 90    Recent Results (from the past 240 hour(s))  Culture, blood (routine x 2)     Status: None   Collection Time: 09/28/14  6:48 PM  Result Value Ref Range Status   Specimen Description BLOOD RIGHT ARM  Final   Special Requests BOTTLES DRAWN AEROBIC AND ANAEROBIC 5CC  Final   Culture  Setup Time   Final    09/28/2014 22:33 Performed at [facility]    Culture   Final    STAPHYLOCOCCUS AUREUS Note: SUSCEPTIBILITIES PERFORMED ON PREVIOUS CULTURE WITHIN THE LAST 5 DAYS. Note: Gram Stain Report Called to,Read Back By and Verified With: KAITLYN REID 09/29/14 @ 12:21PM BY RUSCOE A. Performed at [facility]    Report Status 10/01/2014 FINAL  Final  Culture, blood (routine x 2)     Status: None   Collection Time: 09/28/14  7:29 PM  Result Value Ref Range Status   Specimen Description BLOOD HAND RIGHT  Final   Special Requests BOTTLES DRAWN AEROBIC AND ANAEROBIC 5CC  Final   Culture  Setup Time   Final    09/29/2014 01:20 Performed at [facility]    Culture   Final    METHICILLIN RESISTANT STAPHYLOCOCCUS AUREUS Note: RIFAMPIN AND GENTAMICIN SHOULD NOT BE USED AS SINGLE DRUGS FOR TREATMENT OF STAPH INFECTIONS. CRITICAL RESULT CALLED TO, READ BACK BY AND VERIFIED WITH: RICKY OUSTERMAN 09/30/14 @ 8:17PM BY RUSCOE A. Note: Gram Stain Report Called to,Read Back By and Verified With: KAITLYN REID 09/29/14 @ 2:59PM BY RUSCOE A. Performed at [facility]    Report Status 10/01/2014 FINAL  Final   Organism ID, Bacteria METHICILLIN RESISTANT STAPHYLOCOCCUS AUREUS  Final      Susceptibility   Methicillin resistant staphylococcus aureus - MIC*    CLINDAMYCIN >=8 RESISTANT Resistant     ERYTHROMYCIN >=8 RESISTANT Resistant     GENTAMICIN <=0.5 SENSITIVE Sensitive     LEVOFLOXACIN >=8 RESISTANT Resistant     OXACILLIN >=4 RESISTANT Resistant     PENICILLIN >=0.5  RESISTANT Resistant     RIFAMPIN <=0.5 SENSITIVE Sensitive     TRIMETH/SULFA <=10 SENSITIVE Sensitive     VANCOMYCIN 1 SENSITIVE Sensitive     TETRACYCLINE <=1 SENSITIVE Sensitive     * METHICILLIN RESISTANT STAPHYLOCOCCUS AUREUS  MRSA PCR Screening     Status: Abnormal   Collection Time: 09/28/14 10:03 PM  Result Value Ref Range Status   MRSA by PCR POSITIVE (A)  NEGATIVE Final    Comment:        The GeneXpert MRSA Assay (FDA approved for NASAL specimens only), is one component of a comprehensive MRSA colonization surveillance program. It is not intended to diagnose MRSA infection nor to guide or monitor treatment for MRSA infections. RESULT CALLED TO, READ BACK BY AND VERIFIED WITH: CALLED TO RN MICHELLE EVANGELISTA [id number] @0243  THANEY  Culture, blood (routine x 2)     Status: None (Preliminary result)   Collection Time: 09/30/14  5:33 PM  Result Value Ref Range Status   Specimen Description BLOOD RIGHT HAND  Final   Special Requests BOTTLES DRAWN AEROBIC ONLY 5CC  Final   Culture  Setup Time   Final    10/01/2014 02:24 Performed at [facility]    Culture   Final           BLOOD CULTURE RECEIVED NO GROWTH TO DATE CULTURE WILL BE HELD FOR 5 DAYS BEFORE ISSUING A FINAL NEGATIVE REPORT Performed at [facility]    Report Status PENDING  Incomplete  Culture, blood (routine x 2)     Status: None (Preliminary result)   Collection Time: 09/30/14  5:39 PM  Result Value Ref Range Status   Specimen Description BLOOD LEFT HAND  Final   Special Requests BOTTLES DRAWN AEROBIC ONLY 5CC  Final   Culture  Setup Time   Final    10/01/2014 02:24 Performed at [facility]    Culture   Final           BLOOD CULTURE RECEIVED NO GROWTH TO DATE CULTURE WILL BE HELD FOR 5 DAYS BEFORE ISSUING A FINAL NEGATIVE REPORT Performed at [facility]    Report Status PENDING  Incomplete  Urine culture     Status: None   Collection Time: 10/01/14  3:16 AM  Result  Value Ref Range Status   Specimen Description URINE, RANDOM  Final   Special Requests NONE  Final   Culture  Setup Time   Final    10/01/2014 09:41 Performed at [place]   Final    6,000 COLONIES/ML Performed at [facility]    Culture   Final    INSIGNIFICANT GROWTH Performed at [facility]    Report Status 10/02/2014 FINAL  Final     Studies: No results found.  Scheduled Meds: . aspirin  325 mg Oral Daily  . carvedilol  6.25 mg Oral BID WC  . cloNIDine  0.1 mg Oral TID  . famotidine  20 mg Oral Daily  . gabapentin  100 mg Oral TID  . heparin  5,000 Units Subcutaneous 3 times per day  . Influenza vac split quadrivalent PF  0.5 mL Intramuscular Tomorrow-1000  . insulin aspart  0-20 Units Subcutaneous TID WC  . insulin aspart  0-5 Units Subcutaneous QHS  . insulin glargine  13 Units Subcutaneous Daily  . lisinopril  40 mg Oral QPM  . sevelamer carbonate  1,600 mg Oral TID WC  . sodium chloride  3 mL Intravenous Q12H  . vancomycin  1,000 mg Intravenous Q M,W,F-HD  . Vitamin D (Ergocalciferol)  50,000 Units Oral Q7 days   Continuous Infusions:   Principal Problem:   CRBSI (catheter-related bloodstream infection) Active Problems:   Sepsis   Line sepsis   ESRD (end stage renal disease) on dialysis   DM (diabetes mellitus)   HTN (hypertension)   SVT (supraventricular tachycardia)  Atrial fibrillation   Type 2 diabetes mellitus with diabetic chronic kidney disease   Staphylococcus aureus bacteremia with sepsis   Spinal stenosis of lumbar region   Screen for STD (sexually transmitted disease)   Left leg weakness   Septic arthritis of sacroiliac joint    Time spent: >35 minutes     Kinnie Feil  [facility] Pager [phone]. If 7PM-7AM, please contact night-coverage at [email or web address], password [email or web address] 10/05/2014, 10:00 AM  LOS: 7 days

## 2014-10-05 NOTE — Progress Notes (Signed)
No SI joint aspiration possible per IR Recommend long term IV abx No indication for surgery Will sign off  N. Eduard Roux, MD [place] 1:19 PM

## 2014-10-05 NOTE — Progress Notes (Addendum)
[facility] Progress Note   Subjective: No new complaints  Filed Vitals:   10/05/14 0550 10/05/14 0644 10/05/14 0800 10/05/14 0900  BP: 156/95  157/88   Pulse: 95  96   Temp:   98.7 F (37.1 C)   TempSrc:   Oral   Resp:   18   Height:      Weight:  112.22 kg (247 lb 6.4 oz)    SpO2:   96% 96%   Exam: GEN: NAD, alert, chronically ill appearing CV: tachy, regular, no murmur or rub PULM: Clear on anterior auscultation ABD: Soft nontender, obese EXT: left leg hip pain with movement, 1+ LE edema bilat NEURO: alert, Ox 3, nf Access: L IJ cath (placed by IR 10.3)  HD: MWF [place] [phone]) 4h  F200   262 lb dry wt (119kg)  350/800  Heparin 4800 > 500 u/hr  R IJ cath (now removed)       Assessment: 1 Catheter-related bacteremia / MRSA - IV vanc/zosyn 10/30, TEE neg. R IJ HD cath removed 10/31and L IJ cath replaced 11/3 by IR.  2 Left hip pain / prob septic left SI joint - for course of AB, no fluid to be aspirated 3 ESRD on HD, cath-dependent at this time, off schedule 4 HTN/vol - BP still high, vol excess, lower vol further w HD tomorrow, cont BP meds (coreg, clon, lisinopril). 5 Anemia - Hb 8.9, not on esa 6 HPTH cont renvela ac 7 Afib w RVR - on coreg, ASA 8 DM per primary  Plan- HD Saturday then MWF, start darbe 40/wk    Kelly Splinter MD  pager [phone]    cell [phone]  10/05/2014, 10:42 AM     Recent Labs Lab 10/02/14 1643 10/03/14 0121 10/04/14 0737  NA 134* 135* 139  K 5.0 4.3 4.8  CL 92* 96 97  CO2 20 23 22   GLUCOSE 126* 110* 76  BUN 78* 33* 45*  CREATININE 13.23* 6.94* 9.59*  CALCIUM 8.7 8.8 9.0  PHOS 6.2*  --  7.0*    Recent Labs Lab 09/28/14 1817 10/02/14 1643 10/04/14 0737  AST 31  --   --   ALT 24  --   --   ALKPHOS 129*  --   --   BILITOT 0.7  --   --   PROT 8.3  --   --   ALBUMIN 3.2* 2.2* 2.3*    Recent Labs Lab 09/28/14 1817  10/02/14 1615 10/03/14 0121 10/04/14 0737  WBC 16.9*  < > 11.4*  9.9 9.6  NEUTROABS 14.6*  --   --   --   --   HGB 11.4*  < > 9.1* 9.6* 8.9*  HCT 34.3*  < > 27.7* 29.8* 28.1*  MCV 83.5  < > 81.0 82.1 83.4  PLT 139*  < > 348 304 351  < > = values in this interval not displayed. Marland Kitchen aspirin  325 mg Oral Daily  . carvedilol  6.25 mg Oral BID WC  . cloNIDine  0.1 mg Oral TID  . famotidine  20 mg Oral Daily  . gabapentin  100 mg Oral TID  . heparin  5,000 Units Subcutaneous 3 times per day  . Influenza vac split quadrivalent PF  0.5 mL Intramuscular Tomorrow-1000  . insulin aspart  0-20 Units Subcutaneous TID WC  . insulin aspart  0-5 Units Subcutaneous QHS  . [START ON 10/06/2014] insulin glargine  5 Units Subcutaneous Daily  . lisinopril  40 mg Oral QPM  . sevelamer carbonate  1,600 mg Oral TID WC  . sodium chloride  3 mL Intravenous Q12H  . vancomycin  1,000 mg Intravenous Q M,W,F-HD  . Vitamin D (Ergocalciferol)  50,000 Units Oral Q7 days     sodium chloride, sodium chloride, acetaminophen **OR** acetaminophen, feeding supplement (NEPRO CARB STEADY), heparin, heparin, lidocaine (PF), lidocaine-prilocaine, oxyCODONE, pentafluoroprop-tetrafluoroeth

## 2014-10-05 NOTE — Progress Notes (Signed)
[place] for Infectious Disease  Date of Admission:  09/28/2014  Antibiotics: vancomycin  Subjective: No complaints  Objective: Temp:  [98.4 F (36.9 C)-99.1 F (37.3 C)] 98.7 F (37.1 C) (11/06 0800) Pulse Rate:  [95-101] 96 (11/06 0800) Resp:  [18-19] 18 (11/06 0800) BP: (136-157)/(75-95) 157/88 mmHg (11/06 0800) SpO2:  [96 %-99 %] 96 % (11/06 0900) Weight:  [247 lb 6.4 oz (112.22 kg)] 247 lb 6.4 oz (112.22 kg) (11/06 0644)  General: sleepy but arousable Skin: no rashes Lungs: CTA B Cor: RRR Abdomen: soft, nt, nd   Lab Results Lab Results  Component Value Date   WBC 9.6 10/04/2014   HGB 8.9* 10/04/2014   HCT 28.1* 10/04/2014   MCV 83.4 10/04/2014   PLT 351 10/04/2014    Lab Results  Component Value Date   CREATININE 9.59* 10/04/2014   BUN 45* 10/04/2014   NA 139 10/04/2014   K 4.8 10/04/2014   CL 97 10/04/2014   CO2 22 10/04/2014    Lab Results  Component Value Date   ALT 24 09/28/2014   AST 31 09/28/2014   ALKPHOS 129* 09/28/2014   BILITOT 0.7 09/28/2014      Microbiology: Recent Results (from the past 240 hour(s))  Culture, blood (routine x 2)     Status: None   Collection Time: 09/28/14  6:48 PM  Result Value Ref Range Status   Specimen Description BLOOD RIGHT ARM  Final   Special Requests BOTTLES DRAWN AEROBIC AND ANAEROBIC 5CC  Final   Culture  Setup Time   Final    09/28/2014 22:33 Performed at [facility]    Culture   Final    STAPHYLOCOCCUS AUREUS Note: SUSCEPTIBILITIES PERFORMED ON PREVIOUS CULTURE WITHIN THE LAST 5 DAYS. Note: Gram Stain Report Called to,Read Back By and Verified With: KAITLYN REID 09/29/14 @ 12:21PM BY RUSCOE A. Performed at [facility]    Report Status 10/01/2014 FINAL  Final  Culture, blood (routine x 2)     Status: None   Collection Time: 09/28/14  7:29 PM  Result Value Ref Range Status   Specimen Description BLOOD HAND RIGHT  Final   Special Requests BOTTLES DRAWN AEROBIC AND  ANAEROBIC 5CC  Final   Culture  Setup Time   Final    09/29/2014 01:20 Performed at [facility]    Culture   Final    METHICILLIN RESISTANT STAPHYLOCOCCUS AUREUS Note: RIFAMPIN AND GENTAMICIN SHOULD NOT BE USED AS SINGLE DRUGS FOR TREATMENT OF STAPH INFECTIONS. CRITICAL RESULT CALLED TO, READ BACK BY AND VERIFIED WITH: RICKY OUSTERMAN 09/30/14 @ 8:17PM BY RUSCOE A. Note: Gram Stain Report Called to,Read Back By and Verified With: KAITLYN REID 09/29/14 @ 2:59PM BY RUSCOE A. Performed at [facility]    Report Status 10/01/2014 FINAL  Final   Organism ID, Bacteria METHICILLIN RESISTANT STAPHYLOCOCCUS AUREUS  Final      Susceptibility   Methicillin resistant staphylococcus aureus - MIC*    CLINDAMYCIN >=8 RESISTANT Resistant     ERYTHROMYCIN >=8 RESISTANT Resistant     GENTAMICIN <=0.5 SENSITIVE Sensitive     LEVOFLOXACIN >=8 RESISTANT Resistant     OXACILLIN >=4 RESISTANT Resistant     PENICILLIN >=0.5 RESISTANT Resistant     RIFAMPIN <=0.5 SENSITIVE Sensitive     TRIMETH/SULFA <=10 SENSITIVE Sensitive     VANCOMYCIN 1 SENSITIVE Sensitive     TETRACYCLINE <=1 SENSITIVE Sensitive     * METHICILLIN RESISTANT STAPHYLOCOCCUS AUREUS  MRSA PCR  Screening     Status: Abnormal   Collection Time: 09/28/14 10:03 PM  Result Value Ref Range Status   MRSA by PCR POSITIVE (A) NEGATIVE Final    Comment:        The GeneXpert MRSA Assay (FDA approved for NASAL specimens only), is one component of a comprehensive MRSA colonization surveillance program. It is not intended to diagnose MRSA infection nor to guide or monitor treatment for MRSA infections. RESULT CALLED TO, READ BACK BY AND VERIFIED WITH: CALLED TO RN MICHELLE EVANGELISTA [phone] @0243  THANEY  Culture, blood (routine x 2)     Status: None (Preliminary result)   Collection Time: 09/30/14  5:33 PM  Result Value Ref Range Status   Specimen Description BLOOD RIGHT HAND  Final   Special Requests BOTTLES DRAWN AEROBIC  ONLY 5CC  Final   Culture  Setup Time   Final    10/01/2014 02:24 Performed at [facility]    Culture   Final           BLOOD CULTURE RECEIVED NO GROWTH TO DATE CULTURE WILL BE HELD FOR 5 DAYS BEFORE ISSUING A FINAL NEGATIVE REPORT Performed at [facility]    Report Status PENDING  Incomplete  Culture, blood (routine x 2)     Status: None (Preliminary result)   Collection Time: 09/30/14  5:39 PM  Result Value Ref Range Status   Specimen Description BLOOD LEFT HAND  Final   Special Requests BOTTLES DRAWN AEROBIC ONLY 5CC  Final   Culture  Setup Time   Final    10/01/2014 02:24 Performed at [facility]    Culture   Final           BLOOD CULTURE RECEIVED NO GROWTH TO DATE CULTURE WILL BE HELD FOR 5 DAYS BEFORE ISSUING A FINAL NEGATIVE REPORT Performed at [facility]    Report Status PENDING  Incomplete  Urine culture     Status: None   Collection Time: 10/01/14  3:16 AM  Result Value Ref Range Status   Specimen Description URINE, RANDOM  Final   Special Requests NONE  Final   Culture  Setup Time   Final    10/01/2014 09:41 Performed at [place]   Final    6,000 COLONIES/ML Performed at [facility]    Culture   Final    INSIGNIFICANT GROWTH Performed at [facility]    Report Status 10/02/2014 FINAL  Final    Studies/Results: No results found.  Assessment/Plan: 1) MRSA bacteremia with possible septic arthritis of sacroiliac joint - repeat blood cultures ngtd.  TEE negative for vegetation.  Had catheter removed.   Continue with vancomycin with dialysis for 4 weeks through Nov 28th.    I  will sign off, call with questions  Scharlene Gloss, [place] for Infectious Disease [place] [email or web address] [phone] pager   [phone] cell 10/05/2014, 12:06 PM

## 2014-10-06 LAB — RENAL FUNCTION PANEL
Albumin: 2.3 g/dL — ABNORMAL LOW (ref 3.5–5.2)
Anion gap: 17 — ABNORMAL HIGH (ref 5–15)
BUN: 36 mg/dL — ABNORMAL HIGH (ref 6–23)
CO2: 26 meq/L (ref 19–32)
CREATININE: 9.04 mg/dL — AB (ref 0.50–1.35)
Calcium: 8.7 mg/dL (ref 8.4–10.5)
Chloride: 91 mEq/L — ABNORMAL LOW (ref 96–112)
GFR calc Af Amer: 7 mL/min — ABNORMAL LOW (ref >90)
GFR calc non Af Amer: 6 mL/min — ABNORMAL LOW (ref >90)
GLUCOSE: 104 mg/dL — AB (ref 70–99)
Phosphorus: 7.2 mg/dL — ABNORMAL HIGH (ref 2.3–4.6)
Potassium: 4.6 mEq/L (ref 3.7–5.3)
Sodium: 134 mEq/L — ABNORMAL LOW (ref 137–147)

## 2014-10-06 LAB — CBC
HCT: 26.7 % — ABNORMAL LOW (ref 39.0–52.0)
HEMOGLOBIN: 8.5 g/dL — AB (ref 13.0–17.0)
MCH: 26.6 pg (ref 26.0–34.0)
MCHC: 31.8 g/dL (ref 30.0–36.0)
MCV: 83.4 fL (ref 78.0–100.0)
Platelets: 337 10*3/uL (ref 150–400)
RBC: 3.2 MIL/uL — AB (ref 4.22–5.81)
RDW: 17.9 % — ABNORMAL HIGH (ref 11.5–15.5)
WBC: 7.3 10*3/uL (ref 4.0–10.5)

## 2014-10-06 LAB — VANCOMYCIN, RANDOM: VANCOMYCIN RM: 44.2 ug/mL

## 2014-10-06 LAB — GLUCOSE, CAPILLARY
GLUCOSE-CAPILLARY: 126 mg/dL — AB (ref 70–99)
Glucose-Capillary: 100 mg/dL — ABNORMAL HIGH (ref 70–99)

## 2014-10-06 MED ORDER — NEPRO/CARBSTEADY PO LIQD
237.0000 mL | Freq: Two times a day (BID) | ORAL | Status: DC
  Filled 2014-10-06 (×4): qty 237

## 2014-10-06 MED ORDER — LIDOCAINE HCL (PF) 1 % IJ SOLN: 5.0000 mL | INTRAMUSCULAR | Status: DC | PRN

## 2014-10-06 MED ORDER — NEPRO/CARBSTEADY PO LIQD: 237.0000 mL | ORAL | Status: DC | PRN

## 2014-10-06 MED ORDER — SODIUM CHLORIDE 0.9 % IV SOLN: 100.0000 mL | INTRAVENOUS | Status: DC | PRN

## 2014-10-06 MED ORDER — ALTEPLASE 2 MG IJ SOLR: 2.0000 mg | Freq: Once | INTRAMUSCULAR | Status: DC | PRN

## 2014-10-06 MED ORDER — CLONIDINE HCL 0.1 MG PO TABS: 0.1000 mg | ORAL_TABLET | Freq: Three times a day (TID) | ORAL | Status: DC

## 2014-10-06 MED ORDER — HEPARIN SODIUM (PORCINE) 1000 UNIT/ML DIALYSIS: 3000.0000 [IU] | INTRAMUSCULAR | Status: DC | PRN

## 2014-10-06 MED ORDER — DARBEPOETIN ALFA 40 MCG/0.4ML IJ SOSY
PREFILLED_SYRINGE | INTRAMUSCULAR | Status: AC
  Administered 2014-10-06: 40 ug via INTRAVENOUS
  Filled 2014-10-06: qty 0.4

## 2014-10-06 MED ORDER — LIDOCAINE-PRILOCAINE 2.5-2.5 % EX CREA: 1.0000 "application " | TOPICAL_CREAM | CUTANEOUS | Status: DC | PRN

## 2014-10-06 MED ORDER — LIDOCAINE-EPINEPHRINE 1 %-1:100000 IJ SOLN
INTRAMUSCULAR | Status: AC
  Filled 2014-10-06: qty 1

## 2014-10-06 MED ORDER — PENTAFLUOROPROP-TETRAFLUOROETH EX AERO: 1.0000 "application " | INHALATION_SPRAY | CUTANEOUS | Status: DC | PRN

## 2014-10-06 MED ORDER — ASPIRIN EC 81 MG PO TBEC: 81.0000 mg | DELAYED_RELEASE_TABLET | Freq: Every day | ORAL | Status: DC

## 2014-10-06 MED ORDER — CARVEDILOL 6.25 MG PO TABS: 6.2500 mg | ORAL_TABLET | Freq: Two times a day (BID) | ORAL | Status: DC

## 2014-10-06 MED ORDER — INSULIN GLARGINE 100 UNIT/ML ~~LOC~~ SOLN: 5.0000 [IU] | Freq: Every day | SUBCUTANEOUS | Status: DC

## 2014-10-06 MED ORDER — HEPARIN SODIUM (PORCINE) 1000 UNIT/ML DIALYSIS: 1000.0000 [IU] | INTRAMUSCULAR | Status: DC | PRN

## 2014-10-06 MED ORDER — OXYCODONE HCL 5 MG PO TABS: 5.0000 mg | ORAL_TABLET | Freq: Four times a day (QID) | ORAL | Status: DC | PRN

## 2014-10-06 MED ORDER — LIDOCAINE-PRILOCAINE 2.5-2.5 % EX CREA
1.0000 "application " | TOPICAL_CREAM | CUTANEOUS | Status: DC | PRN
  Filled 2014-10-06: qty 5

## 2014-10-06 MED ORDER — VANCOMYCIN HCL IN DEXTROSE 1-5 GM/200ML-% IV SOLN: 1000.0000 mg | INTRAVENOUS | Status: DC

## 2014-10-06 NOTE — Progress Notes (Signed)
INITIAL NUTRITION ASSESSMENT  DOCUMENTATION CODES Per approved criteria  -Obesity Unspecified   INTERVENTION: - Nepro Shake po BID, each supplement provides 425 kcal and 19 grams protein  NUTRITION DIAGNOSIS: Inadequate oral intake related to poor appetite as evidenced by wt loss.   Goal: Pt to meet >/= 90% of their estimated nutrition needs   Monitor:  Weight trend, po intake, I/O's, labs, acceptance of supplements  Reason for Assessment: MST  59 y.o. male  Admitting Dx: CRBSI (catheter-related bloodstream infection)  ASSESSMENT: 58 y.o. male sent to the ED from dialysis with tachycardia, fever, chills, hypertension. Symptoms onset during dialysis today. There is associated tenderness, swelling, and warmth at his RIJ tunneled catheter which is his dialysis access site.  - Pt reports a poor appetite for the past several weeks. He says that he has lost some weight recentyl. Per chart history, pt has lost ~4 lbs. Pt's po intake is fair. He had cereal and milk this morning for breakfast. Agreed to try nutritional supplements to improve po intake.  Height: Ht Readings from Last 1 Encounters:  10/01/14 6' (1.829 m)    Weight: Wt Readings from Last 1 Encounters:  10/06/14 251 lb 5.2 oz (114 kg)    Ideal Body Weight: 178 lbs  % Ideal Body Weight: 141%  Wt Readings from Last 10 Encounters:  10/06/14 251 lb 5.2 oz (114 kg)  10/02/14 255 lb (115.667 kg)  09/26/14 220 lb (99.791 kg)    Usual Body Weight: 255 lbs  % Usual Body Weight: 98%  BMI:  Body mass index is 34.08 kg/(m^2).  Estimated Nutritional Needs: Kcal: 2600-2800 Protein: 95-105 g Fluid: Per MD  Skin: intact  Diet Order: Diet Carb Modified  EDUCATION NEEDS: -Education needs addressed   Intake/Output Summary (Last 24 hours) at 10/06/14 1036 Last data filed at 10/06/14 0900  Gross per 24 hour  Intake   1040 ml  Output      0 ml  Net   1040 ml    Last BM: 11/1   Labs:   Recent Labs Lab  10/02/14 1643 10/03/14 0121 10/04/14 0737  NA 134* 135* 139  K 5.0 4.3 4.8  CL 92* 96 97  CO2 20 23 22   BUN 78* 33* 45*  CREATININE 13.23* 6.94* 9.59*  CALCIUM 8.7 8.8 9.0  PHOS 6.2*  --  7.0*  GLUCOSE 126* 110* 76    CBG (last 3)   Recent Labs  10/05/14 1745 10/05/14 2111 10/06/14 0622  GLUCAP 102* 138* 100*    Scheduled Meds: . aspirin  325 mg Oral Daily  . carvedilol  6.25 mg Oral BID WC  . cloNIDine  0.1 mg Oral TID  . darbepoetin (ARANESP) injection - DIALYSIS  40 mcg Intravenous Q Sat-HD  . famotidine  20 mg Oral Daily  . gabapentin  100 mg Oral TID  . heparin  5,000 Units Subcutaneous 3 times per day  . Influenza vac split quadrivalent PF  0.5 mL Intramuscular Tomorrow-1000  . insulin aspart  0-20 Units Subcutaneous TID WC  . insulin aspart  0-5 Units Subcutaneous QHS  . insulin glargine  5 Units Subcutaneous Daily  . lisinopril  40 mg Oral QPM  . sevelamer carbonate  1,600 mg Oral TID WC  . sodium chloride  3 mL Intravenous Q12H  . [START ON 10/08/2014] vancomycin  1,000 mg Intravenous Q M,W,F-HD  . Vitamin D (Ergocalciferol)  50,000 Units Oral Q7 days    Continuous Infusions:   Past Medical History  Diagnosis Date  . Diabetes mellitus without complication   . Renal disorder   . Dialysis patient   . Hypertension     Past Surgical History  Procedure Laterality Date  . Tee without cardioversion N/A 10/03/2014    Procedure: TRANSESOPHAGEAL ECHOCARDIOGRAM (TEE);  Surgeon: Thayer Headings, MD;  Location: [facility];  Service: Cardiovascular;  Laterality: N/A;    Laurette Schimke RD, LDN

## 2014-10-06 NOTE — Progress Notes (Signed)
ANTIBIOTIC CONSULT NOTE - FOLLOW UP  Pharmacy Consult for vancomycin Indication: MRSA CRBSI  No Known Allergies  Patient Measurements: Height: 6' (182.9 cm) Weight: 251 lb 5.2 oz (114 kg) IBW/kg (Calculated) : 77.6 Adjusted Body Weight:   Vital Signs: Temp: 98.6 F (37 C) (11/07 1245) Temp Source: Oral (11/07 1245) BP: 155/99 mmHg (11/07 1430) Pulse Rate: 96 (11/07 1430) Intake/Output from previous day: 11/06 0701 - 11/07 0700 In: 1280 [P.O.:1080; IV Piggyback:200] Out: 0  Intake/Output from this shift: Total I/O In: 120 [P.O.:120] Out: -   Labs:  Recent Labs  10/04/14 0737 10/06/14 1330  WBC 9.6 7.3  HGB 8.9* 8.5*  PLT 351 337  CREATININE 9.59* 9.04*   Estimated Creatinine Clearance: 11.9 mL/min (by C-G formula based on Cr of 9.04).  Recent Labs  10/06/14 1200  VANCORANDOM 44.2     Microbiology: Recent Results (from the past 720 hour(s))  Culture, blood (routine x 2)     Status: None   Collection Time: 09/28/14  6:48 PM  Result Value Ref Range Status   Specimen Description BLOOD RIGHT ARM  Final   Special Requests BOTTLES DRAWN AEROBIC AND ANAEROBIC 5CC  Final   Culture  Setup Time   Final    09/28/2014 22:33 Performed at [facility]    Culture   Final    STAPHYLOCOCCUS AUREUS Note: SUSCEPTIBILITIES PERFORMED ON PREVIOUS CULTURE WITHIN THE LAST 5 DAYS. Note: Gram Stain Report Called to,Read Back By and Verified With: KAITLYN REID 09/29/14 @ 12:21PM BY RUSCOE A. Performed at [facility]    Report Status 10/01/2014 FINAL  Final  Culture, blood (routine x 2)     Status: None   Collection Time: 09/28/14  7:29 PM  Result Value Ref Range Status   Specimen Description BLOOD HAND RIGHT  Final   Special Requests BOTTLES DRAWN AEROBIC AND ANAEROBIC 5CC  Final   Culture  Setup Time   Final    09/29/2014 01:20 Performed at [facility]    Culture   Final    METHICILLIN RESISTANT STAPHYLOCOCCUS AUREUS Note: RIFAMPIN AND  GENTAMICIN SHOULD NOT BE USED AS SINGLE DRUGS FOR TREATMENT OF STAPH INFECTIONS. CRITICAL RESULT CALLED TO, READ BACK BY AND VERIFIED WITH: RICKY OUSTERMAN 09/30/14 @ 8:17PM BY RUSCOE A. Note: Gram Stain Report Called to,Read Back By and Verified With: KAITLYN REID 09/29/14 @ 2:59PM BY RUSCOE A. Performed at [facility]    Report Status 10/01/2014 FINAL  Final   Organism ID, Bacteria METHICILLIN RESISTANT STAPHYLOCOCCUS AUREUS  Final      Susceptibility   Methicillin resistant staphylococcus aureus - MIC*    CLINDAMYCIN >=8 RESISTANT Resistant     ERYTHROMYCIN >=8 RESISTANT Resistant     GENTAMICIN <=0.5 SENSITIVE Sensitive     LEVOFLOXACIN >=8 RESISTANT Resistant     OXACILLIN >=4 RESISTANT Resistant     PENICILLIN >=0.5 RESISTANT Resistant     RIFAMPIN <=0.5 SENSITIVE Sensitive     TRIMETH/SULFA <=10 SENSITIVE Sensitive     VANCOMYCIN 1 SENSITIVE Sensitive     TETRACYCLINE <=1 SENSITIVE Sensitive     * METHICILLIN RESISTANT STAPHYLOCOCCUS AUREUS  MRSA PCR Screening     Status: Abnormal   Collection Time: 09/28/14 10:03 PM  Result Value Ref Range Status   MRSA by PCR POSITIVE (A) NEGATIVE Final    Comment:        The GeneXpert MRSA Assay (FDA approved for NASAL specimens only), is one component of a comprehensive MRSA colonization  surveillance program. It is not intended to diagnose MRSA infection nor to guide or monitor treatment for MRSA infections. RESULT CALLED TO, READ BACK BY AND VERIFIED WITH: CALLED TO RN MICHELLE EVANGELISTA [phone] @0243  THANEY  Culture, blood (routine x 2)     Status: None (Preliminary result)   Collection Time: 09/30/14  5:33 PM  Result Value Ref Range Status   Specimen Description BLOOD RIGHT HAND  Final   Special Requests BOTTLES DRAWN AEROBIC ONLY 5CC  Final   Culture  Setup Time   Final    10/01/2014 02:24 Performed at [facility]    Culture   Final           BLOOD CULTURE RECEIVED NO GROWTH TO DATE CULTURE WILL BE HELD  FOR 5 DAYS BEFORE ISSUING A FINAL NEGATIVE REPORT Performed at [facility]    Report Status PENDING  Incomplete  Culture, blood (routine x 2)     Status: None (Preliminary result)   Collection Time: 09/30/14  5:39 PM  Result Value Ref Range Status   Specimen Description BLOOD LEFT HAND  Final   Special Requests BOTTLES DRAWN AEROBIC ONLY 5CC  Final   Culture  Setup Time   Final    10/01/2014 02:24 Performed at [facility]    Culture   Final           BLOOD CULTURE RECEIVED NO GROWTH TO DATE CULTURE WILL BE HELD FOR 5 DAYS BEFORE ISSUING A FINAL NEGATIVE REPORT Performed at [facility]    Report Status PENDING  Incomplete  Urine culture     Status: None   Collection Time: 10/01/14  3:16 AM  Result Value Ref Range Status   Specimen Description URINE, RANDOM  Final   Special Requests NONE  Final   Culture  Setup Time   Final    10/01/2014 09:41 Performed at [place]   Final    6,000 COLONIES/ML Performed at [facility]    Culture   Final    INSIGNIFICANT GROWTH Performed at [facility]    Report Status 10/02/2014 FINAL  Final    Anti-infectives    Start     Dose/Rate Route Frequency Ordered Stop   10/08/14 1200  vancomycin (VANCOCIN) IVPB 1000 mg/200 mL premix     1,000 mg200 mL/hr over 60 Minutes Intravenous Every M-W-F (Hemodialysis) 10/05/14 1304     10/08/14 0000  vancomycin (VANCOCIN) 1 GM/200ML SOLN    Comments:  Last dose 10/27/2014   1,000 mg200 mL/hr over 60 Minutes Intravenous Every M-W-F (Hemodialysis) 10/06/14 1132     10/05/14 1200  vancomycin (VANCOCIN) IVPB 1000 mg/200 mL premix  Status:  Discontinued     1,000 mg200 mL/hr over 60 Minutes Intravenous Every M-W-F (Hemodialysis) 10/03/14 1057 10/05/14 1304   10/04/14 1800  vancomycin (VANCOCIN) IVPB 1000 mg/200 mL premix     1,000 mg200 mL/hr over 60 Minutes Intravenous  Once 10/03/14 1057 10/04/14 1503   10/02/14 2000  vancomycin  (VANCOCIN) IVPB 1000 mg/200 mL premix     1,000 mg200 mL/hr over 60 Minutes Intravenous  Once 10/02/14 1127 10/02/14 2039   10/02/14 1300  ceFAZolin (ANCEF) IVPB 2 g/50 mL premix    Comments:  Hang ON CALL to xray 11/3   2 g100 mL/hr over 30 Minutes Intravenous On call 10/02/14 1251 10/02/14 1540   10/01/14 1200  vancomycin (VANCOCIN) IVPB 1000 mg/200 mL premix  Status:  Discontinued  1,000 mg200 mL/hr over 60 Minutes Intravenous Every M-W-F (Hemodialysis) 09/30/14 1619 09/30/14 1622   10/01/14 1200  ceFAZolin (ANCEF) IVPB 2 g/50 mL premix  Status:  Discontinued     2 g100 mL/hr over 30 Minutes Intravenous Every M-W-F (Hemodialysis) 09/30/14 1619 09/30/14 1622   09/30/14 1700  ceFAZolin (ANCEF) IVPB 2 g/50 mL premix     2 g100 mL/hr over 30 Minutes Intravenous  Once 09/30/14 1619 09/30/14 1811   09/28/14 2000  piperacillin-tazobactam (ZOSYN) IVPB 2.25 g  Status:  Discontinued     2.25 g100 mL/hr over 30 Minutes Intravenous Every 8 hours 09/28/14 1905 09/30/14 1610   09/28/14 1915  vancomycin (VANCOCIN) 2,000 mg in sodium chloride 0.9 % 500 mL IVPB     2,000 mg250 mL/hr over 120 Minutes Intravenous  Once 09/28/14 1905 09/28/14 2208      Assessment: 58 yo male with MRSA CRBSI is currently on supratherapeutic vancomycin.  Pre-HD vancomycin today was 44.2.  Patient received one dose of vanc yesterday but there was not any HD.  He is having HD right now.    Goal of Therapy:  pre-HD vancomycin goal 15-25  Plan:  - No vanc today - Consider rechecking pre-HD vanc on Monday or else do 1g iv qMWF starting Monday - F/u renal plans, C&S, clinical status   Signa Cheek, Tsz-Yin 10/06/2014,2:52 PM

## 2014-10-06 NOTE — Discharge Instructions (Signed)
Please follow up with infectious disease specialist clinic in 2 week  Please follow up  With orthopedics Dr. Erlinda Hong in 2-3 weeks

## 2014-10-06 NOTE — Plan of Care (Signed)
Problem: Phase I Progression Outcomes Goal: OOB as tolerated unless otherwise ordered Outcome: Progressing Goal: Hemodynamically stable Outcome: Completed/Met Date Met:  10/06/14  Problem: Phase II Progression Outcomes Goal: Progress activity as tolerated unless otherwise ordered Outcome: Progressing Goal: Discharge plan established Outcome: Progressing Goal: Vital signs remain stable Outcome: Completed/Met Date Met:  10/06/14 Goal: IV changed to normal saline lock Outcome: Completed/Met Date Met:  10/06/14

## 2014-10-06 NOTE — Discharge Summary (Addendum)
Physician Discharge Summary  Noah Vang VZD:[id number] DOB: 1956/04/23 DOA: 09/28/2014  PCP: No PCP Per Patient  Admit date: 09/28/2014 Discharge date: 10/06/2014  Time spent: >35 minutes  Recommendations for Outpatient Follow-up:  F/u with HD as scheduled to cont IV vanc last dose 10/27/14 F/u with orthopedics in 2-3 weeks to repeat MRI F/u with ID clinic in 2 week   Discharge Diagnoses:  Principal Problem:   CRBSI (catheter-related bloodstream infection) Active Problems:   Sepsis   Line sepsis   ESRD (end stage renal disease) on dialysis   DM (diabetes mellitus)   HTN (hypertension)   SVT (supraventricular tachycardia)   Atrial fibrillation   Type 2 diabetes mellitus with diabetic chronic kidney disease   Staphylococcus aureus bacteremia with sepsis   Spinal stenosis of lumbar region   Screen for STD (sexually transmitted disease)   Left leg weakness   Septic arthritis of sacroiliac joint   Discharge Condition: stable   Diet recommendation: low sodium, renal   Filed Weights   10/04/14 1139 10/05/14 0644 10/06/14 0531  Weight: 113.3 kg (249 lb 12.5 oz) 112.22 kg (247 lb 6.4 oz) 114 kg (251 lb 5.2 oz)    History of present illness:  58 y.o. male with PMH of DM, HTN, ESRD on HD sent to the ED from dialysis with tachycardia, fever, chills, hypertension -admitted with bacteremia, HD cath infection  Hospital Course:   1. Sepsis due to MRSA bacteremia- most likely source is his RIJ tunneled dialysis catheter  -initial BCx + 2/2 MRSA; repeated 11/1- NGTD; TEE: no Vegs; IR pulled infected catheter 10/31 -cont Vanc per ID with dialysis last dose 10/27/14;   2. Probable septic joint SI; MRI: Abnormal edema along the left sacroiliac joint with a small SI joint effusion and edema tracking along adjacent muscular and tissue planes. Appearance concerning for septic left sacroiliac joint -per IR: no area of fluid for aspiration; cont atx, appreciate ID, ortho input ; needs  outpatient MRI to repeat prior to stop atx  -PT/OT eval: HHC with PT upon discharge  3. DJD hips BL; Spinal stenopsis;  -MRI of spine shows: Moderate to severe spinal stenosis at L4-5 with grade 1 anterior slip of L4 and L5. L5 nerve root impingementbilaterally -- follow up outpatient- PRN pain meds 4. HTN uncontrolled; change labetalol to carvedilol; resume clonidine; cont ACE;  -stable on current regimen, cont outpatient follow up with further titration as needed  5. ESRD - appreciate nephrology consulted- L HD access placed 11/3; cont HD  6. DM check ha1c-5.9; decreased lantus 13-->5 units 11/6; cont outpatient titration as needed  7. Paroxysmal Atrial fib - control with BB, added ASA -currently in NSR; Discussed further anticoagulation with patient- He declined anticoagulation    Plan to d/c home today after HD, cont IV atx with HD; outpatient follow up as above  -d/w patient, his wife at  The bedside    Procedures:  HD (i.e. Studies not automatically included, echos, thoracentesis, etc; not x-rays)  Consultations:  ID, ortho, nephrology   Discharge Exam: Filed Vitals:   10/06/14 0531  BP: 138/82  Pulse: 90  Temp: 98.2 F (36.8 C)  Resp: 16    General: alert Cardiovascular: s1,s2 rrr Respiratory: CTA BL  Discharge Instructions  Discharge Instructions    Diet - low sodium heart healthy    Complete by:  As directed      Discharge instructions    Complete by:  As directed   Please follow up with  infectious disease specialist clinic in 2 week  Please follow up  With orthopedics Dr. Erlinda Hong in 2-3 weeks  Please follow up with dialysis as scheduled to continue IV vancomycin     Increase activity slowly    Complete by:  As directed             Medication List    STOP taking these medications        labetalol 300 MG tablet  Commonly known as:  NORMODYNE      TAKE these medications        aspirin EC 81 MG tablet  Take 1 tablet (81 mg total) by mouth  daily.     carvedilol 6.25 MG tablet  Commonly known as:  COREG  Take 1 tablet (6.25 mg total) by mouth 2 (two) times daily with a meal.     cloNIDine 0.1 MG tablet  Commonly known as:  CATAPRES  Take 1 tablet (0.1 mg total) by mouth 3 (three) times daily.     doxazosin 4 MG tablet  Commonly known as:  CARDURA  Take 4 mg by mouth at bedtime.     famotidine 20 MG tablet  Commonly known as:  PEPCID  Take 20 mg by mouth daily.     gabapentin 100 MG capsule  Commonly known as:  NEURONTIN  Take 100 mg by mouth 3 (three) times daily.     insulin glargine 100 UNIT/ML injection  Commonly known as:  LANTUS  Inject 0.05 mLs (5 Units total) into the skin daily.     lidocaine-prilocaine cream  Commonly known as:  EMLA  Apply 1 application topically as needed (topical anesthesia for hemodialysis if Gebauers and Lidocaine injection are ineffective.).     lisinopril 40 MG tablet  Commonly known as:  PRINIVIL,ZESTRIL  Take 40 mg by mouth daily.     oxyCODONE 5 MG immediate release tablet  Commonly known as:  Oxy IR/ROXICODONE  Take 1-2 tablets (5-10 mg total) by mouth every 6 (six) hours as needed for severe pain.     sevelamer carbonate 800 MG tablet  Commonly known as:  RENVELA  Take 1,600 mg by mouth 3 (three) times daily with meals.     torsemide 20 MG tablet  Commonly known as:  DEMADEX  Take 40 mg by mouth daily.     vancomycin 1 GM/200ML Soln  Commonly known as:  VANCOCIN  Inject 200 mLs (1,000 mg total) into the vein every Monday, Wednesday, and Friday with hemodialysis.  Start taking on:  10/08/2014     Vitamin D (Ergocalciferol) 50000 UNITS Caps capsule  Commonly known as:  DRISDOL  Take 50,000 Units by mouth every 7 (seven) days. On Thursday       No Known Allergies     Follow-up Information    Follow up with No PCP Per Patient.   Specialty:  General Practice      Follow up with Marianna Payment, MD. Schedule an appointment as soon as possible for a visit  in 2 weeks.   Specialty:  Orthopedic Surgery   Contact information:   [address] [place] [address] [phone]       Follow up with Scharlene Gloss, MD. Schedule an appointment as soon as possible for a visit in 2 weeks.   Specialty:  Infectious Diseases   Contact information:   [address] [address] [phone]        The results of significant diagnostics  from this hospitalization (including imaging, microbiology, ancillary and laboratory) are listed below for reference.    Significant Diagnostic Studies: Dg Chest 2 View  09/26/2014   CLINICAL DATA:  Shortness of breath.  Renal failure on dialysis.  EXAM: CHEST  2 VIEW  COMPARISON:  08/22/2014  FINDINGS: Cardiomegaly and pulmonary vascular congestion remains stable. There is a new small right pleural effusion and mild right basilar atelectasis. Right internal jugular dual-lumen central venous dialysis catheter remains in place.  IMPRESSION: New small right pleural effusion mild right basilar atelectasis.  Stable cardiomegaly and pulmonary vascular congestion.   Electronically Signed   By: Earle Gell M.D.   On: 09/26/2014 13:20   Ct Head Wo Contrast  09/26/2014   CLINICAL DATA:  Dizziness, lightheadedness  EXAM: CT HEAD WITHOUT CONTRAST  TECHNIQUE: Contiguous axial images were obtained from the base of the skull through the vertex without intravenous contrast.  COMPARISON:  08/22/2014  FINDINGS: There is no evidence of mass effect, midline shift or extra-axial fluid collections. There is no evidence of a space-occupying lesion or intracranial hemorrhage. There is no evidence of a cortical-based area of acute infarction.  The ventricles and sulci are appropriate for the patient's age. The basal cisterns are patent.  Visualized portions of the orbits are unremarkable. Bilateral mild ethmoid sinus mucosal thickening. Intracranial cerebrovascular atherosclerotic disease.  The osseous structures are  unremarkable.  IMPRESSION: No acute intracranial pathology.   Electronically Signed   By: Kathreen Devoid   On: 09/26/2014 13:06   Mr Thoracic Spine Wo Contrast  09/30/2014   CLINICAL DATA:  Left leg weakness. Diabetes and hypertension. ESRD on dialysis  EXAM: MRI THORACIC AND LUMBAR SPINE WITHOUT CONTRAST  TECHNIQUE: Multiplanar and multiecho pulse sequences of the thoracic and lumbar spine were obtained without intravenous contrast.  COMPARISON:  None.  FINDINGS: MR THORACIC SPINE FINDINGS  Image quality is limited due to motion and body habitus.  Negative for fracture or mass lesion. No cord compression. No cord lesion is identified. Cord evaluation is limited due to motion.  Negative for disc protrusion or spinal stenosis.  Cellular bone marrow pattern consistent with anemia and dialysis.  MR LUMBAR SPINE FINDINGS  Negative for lumbar fracture or mass lesion. Conus medullaris is normal and terminates at T12-L1.  T12-L1:  Negative  L1-2:  Negative  L2-3: Early facet degeneration. Negative for spinal stenosis or disc protrusion.  L3-4: Mild facet degeneration. Negative for disc protrusion or spinal stenosis. Early disc degeneration.  L4-5: 4 mm anterior slip with disc and facet degeneration. Marked bony overgrowth of the facet joints. This causes moderate to severe spinal stenosis. Subarticular stenosis bilaterally causing impingement of the L5 nerve root bilaterally right greater than left  L5-S1:  Negative  IMPRESSION: MR THORACIC SPINE IMPRESSION  Limited image quality the thoracic spine.  No acute abnormality  MR LUMBAR SPINE IMPRESSION  Limited image quality in the lumbar spine.  Moderate to severe spinal stenosis at L4-5 with grade 1 anterior slip of L4 and L5. L5 nerve root impingement bilaterally.   Electronically Signed   By: Franchot Gallo M.D.   On: 09/30/2014 16:06   Mr Lumbar Spine Wo Contrast  09/30/2014   CLINICAL DATA:  Left leg weakness. Diabetes and hypertension. ESRD on dialysis  EXAM: MRI  THORACIC AND LUMBAR SPINE WITHOUT CONTRAST  TECHNIQUE: Multiplanar and multiecho pulse sequences of the thoracic and lumbar spine were obtained without intravenous contrast.  COMPARISON:  None.  FINDINGS: MR THORACIC SPINE FINDINGS  Image quality is limited due to motion and body habitus.  Negative for fracture or mass lesion. No cord compression. No cord lesion is identified. Cord evaluation is limited due to motion.  Negative for disc protrusion or spinal stenosis.  Cellular bone marrow pattern consistent with anemia and dialysis.  MR LUMBAR SPINE FINDINGS  Negative for lumbar fracture or mass lesion. Conus medullaris is normal and terminates at T12-L1.  T12-L1:  Negative  L1-2:  Negative  L2-3: Early facet degeneration. Negative for spinal stenosis or disc protrusion.  L3-4: Mild facet degeneration. Negative for disc protrusion or spinal stenosis. Early disc degeneration.  L4-5: 4 mm anterior slip with disc and facet degeneration. Marked bony overgrowth of the facet joints. This causes moderate to severe spinal stenosis. Subarticular stenosis bilaterally causing impingement of the L5 nerve root bilaterally right greater than left  L5-S1:  Negative  IMPRESSION: MR THORACIC SPINE IMPRESSION  Limited image quality the thoracic spine.  No acute abnormality  MR LUMBAR SPINE IMPRESSION  Limited image quality in the lumbar spine.  Moderate to severe spinal stenosis at L4-5 with grade 1 anterior slip of L4 and L5. L5 nerve root impingement bilaterally.   Electronically Signed   By: Franchot Gallo M.D.   On: 09/30/2014 16:06   Mr Hip Left Wo Contrast  10/02/2014   CLINICAL DATA:  Left hip pain radiating down the leg.  EXAM: MR OF THE LEFT HIP WITHOUT CONTRAST  TECHNIQUE: Multiplanar, multisequence MR imaging was performed. No intravenous contrast was administered.  COMPARISON:  08/15/2014  FINDINGS: Bones: Abnormal left sacroiliac joint with joint effusion inferiorly (image 5, series 6), and abnormal edema signal along  the margins of the joint, especially the iliac side. Subtle edema anteriorly in the left pubic body.  Articular cartilage and labrum  Articular cartilage: Mild to moderate but symmetric degenerative articular cartilage thinning. Subcortical cyst formation laterally in the acetabulum.  Labrum:  Grossly intact  Joint or bursal effusion  Joint effusion:  Absent  Bursae:  Unremarkable  Muscles and tendons  Muscles and tendons: There is abnormal edema tracking along tissue planes adjacent to the left sacroiliac joint, including deep to the left iliacus muscle, the sciatic notch, and adjacent left gluteal musculature. Presacral fluid noted along with edema signal in the left piriformis muscle. Low-level but symmetric edema tracks along the hip adductor musculature. Mild proximal hamstring tendinopathy on the left.  Other findings  Miscellaneous: Transitional lumbosacral vertebra with broad transverse processes articulating with the rest of the sacrum.  IMPRESSION: 1. Abnormal edema along the left sacroiliac joint with a small SI joint effusion and edema tracking along adjacent muscular and tissue planes. Appearance concerning for septic left sacroiliac joint. 2. Bilateral degenerative arthropathy of the hips. 3. Proximal hamstring tendinopathy on the left.   Electronically Signed   By: Sherryl Barters M.D.   On: 10/02/2014 11:48   Ir Fluoro Guide Cv Line Left  10/02/2014   INDICATION: History of right internal jugular approach tunneled dialysis catheter placement on 08/21/2014, admitted with bacteremia with subsequent removal of the dialysis catheter on 09/29/2014. Patient has remained and antibiotics with normalization of white blood cell count and has remained afebrile for the past 48 hours. Request made for placement of a new tunneled dialysis catheter for continuation of dialysis.  EXAM: TUNNELED CENTRAL VENOUS HEMODIALYSIS CATHETER PLACEMENT WITH ULTRASOUND AND FLUOROSCOPIC GUIDANCE  MEDICATIONS: Ancef 2 gm IV;  The IV antibiotic was given in an appropriate time interval prior to skin puncture.  CONTRAST:  None  ANESTHESIA/SEDATION: Versed 1 mg IV; Fentanyl 50 mcg IV  Total Moderate Sedation Time  11 minutes.  FLUOROSCOPY TIME:  54 seconds.  COMPLICATIONS: None immediate  PROCEDURE: Informed written consent was obtained from the patient after a discussion of the risks, benefits, and alternatives to treatment. Questions regarding the procedure were encouraged and answered.  Given concern for recent infection involving the right internal jugular approach dialysis catheter, the decision was made to place a left internal jugular approach dialysis catheter. As such, the left neck and chest were prepped with chlorhexidine in a sterile fashion, and a sterile drape was applied covering the operative field. Maximum barrier sterile technique with sterile gowns and gloves were used for the procedure. A timeout was performed prior to the initiation of the procedure.  After creating a small venotomy incision, a micropuncture kit was utilized to access the left internal jugular vein under direct, real-time ultrasound guidance after the overlying soft tissues were anesthetized with 1% lidocaine with epinephrine. Ultrasound image documentation was performed. The microwire was kinked to measure appropriate catheter length. A stiff Glidewire was advanced to the level of the IVC and the micropuncture sheath was exchanged for a peel-away sheath. A Palindrome tunneled hemodialysis catheter measuring 23 cm from tip to cuff was tunneled in a retrograde fashion from the anterior chest wall to the venotomy incision.  The catheter was then placed through the peel-away sheath with tips ultimately positioned within the superior aspect of the right atrium. Final catheter positioning was confirmed and documented with a spot radiographic image. The catheter aspirates and flushes normally. The catheter was flushed with appropriate volume heparin dwells.   The catheter exit site was secured with a 0-Prolene retention suture. The venotomy incision was closed with an interrupted 4-0 Vicryl, Dermabond and Steri-strips. Dressings were applied. The patient tolerated the procedure well without immediate post procedural complication.  IMPRESSION: Successful placement of 23 cm tip to cuff tunneled hemodialysis catheter via the left internal jugular vein with tips terminating within the superior aspect of the right atrium. The catheter is ready for immediate use.   Electronically Signed   By: Sandi Mariscal M.D.   On: 10/02/2014 16:07   Ir Removal Tun Cv Cath W/o Fl  09/29/2014   CLINICAL DATA:  Renal failure, bacteremia and tenderness overlying a right chest tunneled dialysis catheter. The patient requires removal of the tunneled catheter due to bacteremia.  EXAM: REMOVAL OF TUNNELED CENTRAL VENOUS CATHETER  PROCEDURE: The right chest dialysis catheter site was prepped with chlorhexidine. A sterile gown and gloves were worn during the procedure. Local anesthesia was provided with 1% lidocaine.  Utilizing sharp and blunt dissection, the subcutaneous cuff of the dialysis catheter was freed. The catheter was then successfully removed in its entirety. A sterile dressing was applied over the catheter exit site.  IMPRESSION: Removal of tunneled dialysis catheter utilizing sharp and blunt dissection. The procedure was uncomplicated.   Electronically Signed   By: Aletta Edouard M.D.   On: 09/29/2014 14:13   Ir US Guide Vasc Access Left  10/02/2014   INDICATION: History of right internal jugular approach tunneled dialysis catheter placement on 08/21/2014, admitted with bacteremia with subsequent removal of the dialysis catheter on 09/29/2014. Patient has remained and antibiotics with normalization of white blood cell count and has remained afebrile for the past 48 hours. Request made for placement of a new tunneled dialysis catheter for continuation of dialysis.  EXAM: TUNNELED  CENTRAL VENOUS HEMODIALYSIS CATHETER PLACEMENT  WITH ULTRASOUND AND FLUOROSCOPIC GUIDANCE  MEDICATIONS: Ancef 2 gm IV; The IV antibiotic was given in an appropriate time interval prior to skin puncture.  CONTRAST:  None  ANESTHESIA/SEDATION: Versed 1 mg IV; Fentanyl 50 mcg IV  Total Moderate Sedation Time  11 minutes.  FLUOROSCOPY TIME:  54 seconds.  COMPLICATIONS: None immediate  PROCEDURE: Informed written consent was obtained from the patient after a discussion of the risks, benefits, and alternatives to treatment. Questions regarding the procedure were encouraged and answered.  Given concern for recent infection involving the right internal jugular approach dialysis catheter, the decision was made to place a left internal jugular approach dialysis catheter. As such, the left neck and chest were prepped with chlorhexidine in a sterile fashion, and a sterile drape was applied covering the operative field. Maximum barrier sterile technique with sterile gowns and gloves were used for the procedure. A timeout was performed prior to the initiation of the procedure.  After creating a small venotomy incision, a micropuncture kit was utilized to access the left internal jugular vein under direct, real-time ultrasound guidance after the overlying soft tissues were anesthetized with 1% lidocaine with epinephrine. Ultrasound image documentation was performed. The microwire was kinked to measure appropriate catheter length. A stiff Glidewire was advanced to the level of the IVC and the micropuncture sheath was exchanged for a peel-away sheath. A Palindrome tunneled hemodialysis catheter measuring 23 cm from tip to cuff was tunneled in a retrograde fashion from the anterior chest wall to the venotomy incision.  The catheter was then placed through the peel-away sheath with tips ultimately positioned within the superior aspect of the right atrium. Final catheter positioning was confirmed and documented with a spot radiographic  image. The catheter aspirates and flushes normally. The catheter was flushed with appropriate volume heparin dwells.  The catheter exit site was secured with a 0-Prolene retention suture. The venotomy incision was closed with an interrupted 4-0 Vicryl, Dermabond and Steri-strips. Dressings were applied. The patient tolerated the procedure well without immediate post procedural complication.  IMPRESSION: Successful placement of 23 cm tip to cuff tunneled hemodialysis catheter via the left internal jugular vein with tips terminating within the superior aspect of the right atrium. The catheter is ready for immediate use.   Electronically Signed   By: Sandi Mariscal M.D.   On: 10/02/2014 16:07   Dg Chest Port 1 View  (if Code Sepsis Called)  09/28/2014   CLINICAL DATA:  Fever.  Tachycardia.  Confusion.  EXAM: PORTABLE CHEST - 1 VIEW  COMPARISON:  09/26/2014  FINDINGS: Moderate cardiomegaly. Hazy bibasilar airspace disease worrisome for edema. Vascular congestion. Small right pleural effusion. No pneumothorax. Stable right internal jugular tunneled dialysis catheter. Tip is at the cavoatrial junction.  IMPRESSION: Volume overload with basilar hazy edema and right pleural effusion.   Electronically Signed   By: Maryclare Bean M.D.   On: 09/28/2014 18:59    Microbiology: Recent Results (from the past 240 hour(s))  Culture, blood (routine x 2)     Status: None   Collection Time: 09/28/14  6:48 PM  Result Value Ref Range Status   Specimen Description BLOOD RIGHT ARM  Final   Special Requests BOTTLES DRAWN AEROBIC AND ANAEROBIC 5CC  Final   Culture  Setup Time   Final    09/28/2014 22:33 Performed at [facility]    Culture   Final    STAPHYLOCOCCUS AUREUS Note: SUSCEPTIBILITIES PERFORMED ON PREVIOUS CULTURE WITHIN THE LAST 5 DAYS. Note: Gram  Stain Report Called to,Read Back By and Verified With: KAITLYN REID 09/29/14 @ 12:21PM BY RUSCOE A. Performed at [facility]    Report Status 10/01/2014  FINAL  Final  Culture, blood (routine x 2)     Status: None   Collection Time: 09/28/14  7:29 PM  Result Value Ref Range Status   Specimen Description BLOOD HAND RIGHT  Final   Special Requests BOTTLES DRAWN AEROBIC AND ANAEROBIC 5CC  Final   Culture  Setup Time   Final    09/29/2014 01:20 Performed at [facility]    Culture   Final    METHICILLIN RESISTANT STAPHYLOCOCCUS AUREUS Note: RIFAMPIN AND GENTAMICIN SHOULD NOT BE USED AS SINGLE DRUGS FOR TREATMENT OF STAPH INFECTIONS. CRITICAL RESULT CALLED TO, READ BACK BY AND VERIFIED WITH: RICKY OUSTERMAN 09/30/14 @ 8:17PM BY RUSCOE A. Note: Gram Stain Report Called to,Read Back By and Verified With: KAITLYN REID 09/29/14 @ 2:59PM BY RUSCOE A. Performed at [facility]    Report Status 10/01/2014 FINAL  Final   Organism ID, Bacteria METHICILLIN RESISTANT STAPHYLOCOCCUS AUREUS  Final      Susceptibility   Methicillin resistant staphylococcus aureus - MIC*    CLINDAMYCIN >=8 RESISTANT Resistant     ERYTHROMYCIN >=8 RESISTANT Resistant     GENTAMICIN <=0.5 SENSITIVE Sensitive     LEVOFLOXACIN >=8 RESISTANT Resistant     OXACILLIN >=4 RESISTANT Resistant     PENICILLIN >=0.5 RESISTANT Resistant     RIFAMPIN <=0.5 SENSITIVE Sensitive     TRIMETH/SULFA <=10 SENSITIVE Sensitive     VANCOMYCIN 1 SENSITIVE Sensitive     TETRACYCLINE <=1 SENSITIVE Sensitive     * METHICILLIN RESISTANT STAPHYLOCOCCUS AUREUS  MRSA PCR Screening     Status: Abnormal   Collection Time: 09/28/14 10:03 PM  Result Value Ref Range Status   MRSA by PCR POSITIVE (A) NEGATIVE Final    Comment:        The GeneXpert MRSA Assay (FDA approved for NASAL specimens only), is one component of a comprehensive MRSA colonization surveillance program. It is not intended to diagnose MRSA infection nor to guide or monitor treatment for MRSA infections. RESULT CALLED TO, READ BACK BY AND VERIFIED WITH: CALLED TO RN Sharyn Lull EVANGELISTA [id number] @[id number]  THANEY   Culture, blood (routine x 2)     Status: None (Preliminary result)   Collection Time: 09/30/14  5:33 PM  Result Value Ref Range Status   Specimen Description BLOOD RIGHT HAND  Final   Special Requests BOTTLES DRAWN AEROBIC ONLY 5CC  Final   Culture  Setup Time   Final    10/01/2014 02:24 Performed at [facility]    Culture   Final           BLOOD CULTURE RECEIVED NO GROWTH TO DATE CULTURE WILL BE HELD FOR 5 DAYS BEFORE ISSUING A FINAL NEGATIVE REPORT Performed at [facility]    Report Status PENDING  Incomplete  Culture, blood (routine x 2)     Status: None (Preliminary result)   Collection Time: 09/30/14  5:39 PM  Result Value Ref Range Status   Specimen Description BLOOD LEFT HAND  Final   Special Requests BOTTLES DRAWN AEROBIC ONLY 5CC  Final   Culture  Setup Time   Final    10/01/2014 02:24 Performed at [facility]    Culture   Final           BLOOD CULTURE RECEIVED NO GROWTH TO DATE CULTURE WILL  BE HELD FOR 5 DAYS BEFORE ISSUING A FINAL NEGATIVE REPORT Performed at [facility]    Report Status PENDING  Incomplete  Urine culture     Status: None   Collection Time: 10/01/14  3:16 AM  Result Value Ref Range Status   Specimen Description URINE, RANDOM  Final   Special Requests NONE  Final   Culture  Setup Time   Final    10/01/2014 09:41 Performed at [place]   Final    6,000 COLONIES/ML Performed at [facility]    Culture   Final    INSIGNIFICANT GROWTH Performed at [facility]    Report Status 10/02/2014 FINAL  Final     Labs: Basic Metabolic Panel:  Recent Labs Lab 09/30/14 0147 10/01/14 0250 10/02/14 1643 10/03/14 0121 10/04/14 0737  NA 134* 135* 134* 135* 139  K 4.4 4.6 5.0 4.3 4.8  CL 95* 93* 92* 96 97  CO2 21 23 20 23 22   GLUCOSE 159* 129* 126* 110* 76  BUN 54* 64* 78* 33* 45*  CREATININE 8.99* 10.73* 13.23* 6.94* 9.59*  CALCIUM 8.9 8.9 8.7 8.8 9.0  PHOS  --    --  6.2*  --  7.0*   Liver Function Tests:  Recent Labs Lab 10/02/14 1643 10/04/14 0737  ALBUMIN 2.2* 2.3*   No results for input(s): LIPASE, AMYLASE in the last 168 hours. No results for input(s): AMMONIA in the last 168 hours. CBC:  Recent Labs Lab 09/30/14 0147 10/01/14 0250 10/02/14 1615 10/03/14 0121 10/04/14 0737  WBC 12.0* 9.6 11.4* 9.9 9.6  HGB 9.7* 11.4* 9.1* 9.6* 8.9*  HCT 29.6* 36.0* 27.7* 29.8* 28.1*  MCV 80.9 80.9 81.0 82.1 83.4  PLT 139* 190 348 304 351   Cardiac Enzymes:  Recent Labs Lab 09/30/14 1358 09/30/14 1931 10/01/14 0250 10/01/14 0856 10/01/14 1337  TROPONINI <0.30 <0.30 <0.30 <0.30 <0.30   BNP: BNP (last 3 results) No results for input(s): PROBNP in the last 8760 hours. CBG:  Recent Labs Lab 10/05/14 1638 10/05/14 1745 10/05/14 2111 10/06/14 0622 10/06/14 1122  GLUCAP 65* 102* 138* 100* 126*       Signed:  Jameir Ake N  [facility] 10/06/2014, 11:36 AM

## 2014-10-07 NOTE — Progress Notes (Signed)
Pt. given discharge instructions,verbalized understanding IV and tele removed.  Taken out via wheelchair.

## 2014-10-08 LAB — IRON AND TIBC
Iron: 23 ug/dL — ABNORMAL LOW (ref 42–135)
Saturation Ratios: 14 % — ABNORMAL LOW (ref 20–55)
TIBC: 165 ug/dL — AB (ref 215–435)
UIBC: 142 ug/dL (ref 125–400)

## 2014-10-08 LAB — CULTURE, BLOOD (ROUTINE X 2)
CULTURE: NO GROWTH
Culture: NO GROWTH

## 2014-10-08 MED ORDER — ASPIRIN EC 81 MG PO TBEC: 81.0000 mg | DELAYED_RELEASE_TABLET | Freq: Every day | ORAL | Status: DC

## 2014-10-08 MED ORDER — CARVEDILOL 6.25 MG PO TABS: 6.2500 mg | ORAL_TABLET | Freq: Two times a day (BID) | ORAL | Status: DC

## 2014-10-08 MED ORDER — OXYCODONE HCL 5 MG PO TABS: 5.0000 mg | ORAL_TABLET | Freq: Four times a day (QID) | ORAL | Status: DC | PRN

## 2014-10-08 MED ORDER — VANCOMYCIN HCL IN DEXTROSE 1-5 GM/200ML-% IV SOLN: 1000.0000 mg | INTRAVENOUS | Status: DC

## 2014-10-23 ENCOUNTER — Encounter: Payer: Self-pay | Admitting: Internal Medicine

## 2014-10-23 ENCOUNTER — Ambulatory Visit (INDEPENDENT_AMBULATORY_CARE_PROVIDER_SITE_OTHER): Payer: 59 | Admitting: Internal Medicine

## 2014-10-23 DIAGNOSIS — A4101 Sepsis due to Methicillin susceptible Staphylococcus aureus: Secondary | ICD-10-CM

## 2014-10-23 LAB — C-REACTIVE PROTEIN: CRP: 4.7 mg/dL — AB (ref <0.60)

## 2014-10-23 MED ORDER — DAPTOMYCIN 500 MG IV SOLR: 600.0000 mg | INTRAVENOUS | Status: DC

## 2014-10-23 NOTE — Progress Notes (Signed)
Patient ID: Noah Vang, male   DOB: 28-Feb-1956, 58 y.o.   MRN: [id number]         [facility] for Infectious Disease  Patient Active Problem List   Diagnosis Date Noted  . Septic arthritis of sacroiliac joint     Priority: High  . Staphylococcus aureus bacteremia with sepsis     Priority: High  . Left leg weakness   . Type 2 diabetes mellitus with diabetic chronic kidney disease   . Spinal stenosis of lumbar region   . Screen for STD (sexually transmitted disease)   . Atrial fibrillation 09/29/2014  . CRBSI (catheter-related bloodstream infection) 09/28/2014  . ESRD (end stage renal disease) on dialysis 09/28/2014  . DM (diabetes mellitus) 09/28/2014  . HTN (hypertension) 09/28/2014  . SVT (supraventricular tachycardia) 09/28/2014    Patient's Medications  New Prescriptions   DAPTOMYCIN (CUBICIN) 500 MG INJECTION    Inject 12 mLs (600 mg total) into the vein 3 (three) times a week.  Previous Medications   ASPIRIN EC 81 MG TABLET    Take 1 tablet (81 mg total) by mouth daily.   CARVEDILOL (COREG) 6.25 MG TABLET    Take 1 tablet (6.25 mg total) by mouth 2 (two) times daily with a meal.   CLONIDINE (CATAPRES) 0.1 MG TABLET    Take 1 tablet (0.1 mg total) by mouth 3 (three) times daily.   DOXAZOSIN (CARDURA) 4 MG TABLET    Take 4 mg by mouth at bedtime.   FAMOTIDINE (PEPCID) 20 MG TABLET    Take 20 mg by mouth daily.   GABAPENTIN (NEURONTIN) 100 MG CAPSULE    Take 100 mg by mouth 3 (three) times daily.   INSULIN GLARGINE (LANTUS) 100 UNIT/ML INJECTION    Inject 0.05 mLs (5 Units total) into the skin daily.   LIDOCAINE-PRILOCAINE (EMLA) CREAM    Apply 1 application topically as needed (topical anesthesia for hemodialysis if Gebauers and Lidocaine injection are ineffective.).   LISINOPRIL (PRINIVIL,ZESTRIL) 40 MG TABLET    Take 40 mg by mouth daily.   OXYCODONE (OXY IR/ROXICODONE) 5 MG IMMEDIATE RELEASE TABLET    Take 1-2 tablets (5-10 mg total) by mouth every 6 (six) hours  as needed for severe pain.   SEVELAMER CARBONATE (RENVELA) 800 MG TABLET    Take 1,600 mg by mouth 3 (three) times daily with meals.   TORSEMIDE (DEMADEX) 20 MG TABLET    Take 40 mg by mouth daily.   VITAMIN D, ERGOCALCIFEROL, (DRISDOL) 50000 UNITS CAPS CAPSULE    Take 50,000 Units by mouth every 7 (seven) days. On Thursday  Modified Medications   No medications on file  Discontinued Medications   VANCOMYCIN (VANCOCIN) 1 GM/200ML SOLN    Inject 200 mLs (1,000 mg total) into the vein every Monday, Wednesday, and Friday with hemodialysis.    Subjective: Noah Vang is in for his hospital follow-up visit. He was seen by my partners when he was hospitalized earlier this month with MRSA bacteremia. Shortly after he was hospitalized he began to develop left hip pain. An MRI scan showed evidence of early sacroiliitis. Repeat blood cultures were negative and there was no evidence of endocarditis by TEE. His hemodialysis catheter was removed and replaced. He was discharged on IV vancomycin after hemodialysis with the plan to treat at least 4 weeks through November 28. Shortly after discharge he developed a generalized, intensely pruritic rash. It was felt that he probably had a vancomycin allergy and his vancomycin was stopped  at his dialysis center on November 11. He has not been started on any other antibiotics. He has not had any other fever since discharge but he is still having left hip pain, especially when he walks. His rash is improving. Review of Systems: Pertinent items are noted in HPI.  Past Medical History  Diagnosis Date  . Diabetes mellitus without complication   . Renal disorder   . Dialysis patient   . Hypertension     History  Substance Use Topics  . Smoking status: Never Smoker   . Smokeless tobacco: Not on file  . Alcohol Use: No    No family history on file.  Allergies  Allergen Reactions  . Vancomycin Rash    Objective: Temp: 97.8 F (36.6 C) (11/24 1527) Temp  Source: Oral (11/24 1527) BP: 162/87 mmHg (11/24 1527) Pulse Rate: 74 (11/24 1527)  General: He is in no distress Skin: Fading, diffuse red maculopapular rash Lungs: Clear Cor: Regular S1 and S2 with no murmurs Chest: His old, right anterior chest catheter site is healing well. His new catheter in his left anterior chest appears normal He is tender over his left SI joint  SED RATE (mm/hr)  Date Value  10/02/2014 98*   CRP (mg/dL)  Date Value  10/02/2014 24.4*      Assessment: He has received only 13 days of vancomycin therapy. I'm concerned that this will not be enough to cure his sacroiliitis. I will ask his dialysis center to start daptomycin IV after each hemodialysis and plan on at least 4 more weeks of therapy.  Plan: 1. Check repeat sedimentation rate and C-reactive protein today 2. Start daptomycin 600 mg IV every Tuesday, Thursday and Saturday after hemodialysis 3. Recommend checking CBC, CMP and CK weekly while on daptomycin 4. Follow-up in one month   Michel Bickers, MD [facility] [place] [phone] pager   [phone] cell 10/23/2014, 4:58 PM

## 2014-10-24 LAB — SEDIMENTATION RATE: Sed Rate: 115 mm/hr — ABNORMAL HIGH (ref 0–16)

## 2014-11-19 ENCOUNTER — Ambulatory Visit (INDEPENDENT_AMBULATORY_CARE_PROVIDER_SITE_OTHER): Payer: 59 | Admitting: Internal Medicine

## 2014-11-19 ENCOUNTER — Encounter: Payer: Self-pay | Admitting: Internal Medicine

## 2014-11-19 VITALS — BP 153/85 | HR 86 | Temp 97.9°F | Wt 250.2 lb

## 2014-11-19 DIAGNOSIS — A4101 Sepsis due to Methicillin susceptible Staphylococcus aureus: Secondary | ICD-10-CM

## 2014-11-19 MED ORDER — DAPTOMYCIN 500 MG IV SOLR: 600.0000 mg | INTRAVENOUS | Status: AC

## 2014-11-19 NOTE — Progress Notes (Signed)
Patient ID: Noah Vang, male   DOB: Aug 30, 1956, 58 y.o.   MRN: [id number]         [facility] for Infectious Disease  Patient Active Problem List   Diagnosis Date Noted  . Septic arthritis of sacroiliac joint     Priority: High  . Staphylococcus aureus bacteremia with sepsis     Priority: High  . Left leg weakness   . Type 2 diabetes mellitus with diabetic chronic kidney disease   . Spinal stenosis of lumbar region   . Screen for STD (sexually transmitted disease)   . Atrial fibrillation 09/29/2014  . CRBSI (catheter-related bloodstream infection) 09/28/2014  . ESRD (end stage renal disease) on dialysis 09/28/2014  . DM (diabetes mellitus) 09/28/2014  . HTN (hypertension) 09/28/2014  . SVT (supraventricular tachycardia) 09/28/2014    Patient's Medications  New Prescriptions   No medications on file  Previous Medications   ASPIRIN EC 81 MG TABLET    Take 1 tablet (81 mg total) by mouth daily.   CARVEDILOL (COREG) 6.25 MG TABLET    Take 1 tablet (6.25 mg total) by mouth 2 (two) times daily with a meal.   CLONIDINE (CATAPRES) 0.1 MG TABLET    Take 1 tablet (0.1 mg total) by mouth 3 (three) times daily.   DOXAZOSIN (CARDURA) 4 MG TABLET    Take 4 mg by mouth at bedtime.   FAMOTIDINE (PEPCID) 20 MG TABLET    Take 20 mg by mouth daily.   GABAPENTIN (NEURONTIN) 100 MG CAPSULE    Take 100 mg by mouth 3 (three) times daily.   INSULIN GLARGINE (LANTUS) 100 UNIT/ML INJECTION    Inject 0.05 mLs (5 Units total) into the skin daily.   LIDOCAINE-PRILOCAINE (EMLA) CREAM    Apply 1 application topically as needed (topical anesthesia for hemodialysis if Gebauers and Lidocaine injection are ineffective.).   LISINOPRIL (PRINIVIL,ZESTRIL) 40 MG TABLET    Take 40 mg by mouth daily.   OXYCODONE (OXY IR/ROXICODONE) 5 MG IMMEDIATE RELEASE TABLET    Take 1-2 tablets (5-10 mg total) by mouth every 6 (six) hours as needed for severe pain.   SEVELAMER CARBONATE (RENVELA) 800 MG TABLET    Take  1,600 mg by mouth 3 (three) times daily with meals.   TORSEMIDE (DEMADEX) 20 MG TABLET    Take 40 mg by mouth daily.   VITAMIN D, ERGOCALCIFEROL, (DRISDOL) 50000 UNITS CAPS CAPSULE    Take 50,000 Units by mouth every 7 (seven) days. On Thursday  Modified Medications   Modified Medication Previous Medication   DAPTOMYCIN (CUBICIN) 500 MG INJECTION DAPTOmycin (CUBICIN) 500 MG injection      Inject 12 mLs (600 mg total) into the vein 3 (three) times a week.    Inject 12 mLs (600 mg total) into the vein 3 (three) times a week.  Discontinued Medications   No medications on file    Subjective: [name] is in for his routine follow-up visit. He is feeling better. He has no more left hip pain. He is concerned that he thinks he has missed 2 doses of his daptomycin since I started it after his first visit here on November 24. He states his dialysis center didn't have it for his second dose. Last week he was hospitalized with poorly controlled hypertension for 3 days. He had a fistula or graft placed in his left upper arm and they did not give him daptomycin while in the hospital. He has been getting it after dialysis other  than those 2 times. He has not had any fever.  Review of Systems: Pertinent items are noted in HPI.  Past Medical History  Diagnosis Date  . Diabetes mellitus without complication   . Renal disorder   . Dialysis patient   . Hypertension     History  Substance Use Topics  . Smoking status: Never Smoker   . Smokeless tobacco: Not on file  . Alcohol Use: No    No family history on file.  Allergies  Allergen Reactions  . Vancomycin Rash    Objective: Temp: 97.9 F (36.6 C) (12/21 1526) Temp Source: Oral (12/21 1526) BP: 153/85 mmHg (12/21 1526) Pulse Rate: 86 (12/21 1526)  General: He is in no distress Skin: No rash Lungs: Clear Cor: Regular S1 and S2 with no murmur Good thrill and murmur in left upper arm  Assessment: I'm hopeful that his MRSA bacteremia  and left sacroiliitis has been cured. He will complete daptomycin on December 27.  Plan: 1. Complete daptomycin on December 27 2. Follow-up in 6 weeks   Michel Bickers, MD [facility] for [place] [phone] pager   [phone] cell 11/19/2014, 3:43 PM

## 2014-11-20 ENCOUNTER — Ambulatory Visit: Payer: 59 | Admitting: Internal Medicine

## 2014-12-31 ENCOUNTER — Telehealth: Payer: Self-pay | Admitting: *Deleted

## 2014-12-31 ENCOUNTER — Ambulatory Visit: Payer: 59 | Admitting: Internal Medicine

## 2014-12-31 NOTE — Telephone Encounter (Signed)
Had dialysis today.  Made new appt for tomorrow.

## 2015-01-01 ENCOUNTER — Ambulatory Visit (INDEPENDENT_AMBULATORY_CARE_PROVIDER_SITE_OTHER): Payer: 59 | Admitting: Internal Medicine

## 2015-01-01 ENCOUNTER — Encounter: Payer: Self-pay | Admitting: Internal Medicine

## 2015-01-01 VITALS — BP 126/71 | HR 89 | Temp 97.6°F | Wt 242.0 lb

## 2015-01-01 DIAGNOSIS — A4101 Sepsis due to Methicillin susceptible Staphylococcus aureus: Secondary | ICD-10-CM
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
## 2015-08-31 HISTORY — PX: ESOPHAGOGASTRODUODENOSCOPY: SHX1529

## 2016-03-16 IMAGING — XA IR FLUORO GUIDE CV LINE*L*
1 series · 2 of 2 positions shown · non-contrast
Comparison: none

INDICATION: History of right internal jugular approach tunneled dialysis
catheter placement on 08/21/2014, admitted with bacteremia with
subsequent removal of the dialysis catheter on 09/29/2014. Patient
has remained and antibiotics with normalization of white blood cell
count and has remained afebrile for the past 48 hours. Request made
for placement of a new tunneled dialysis catheter for continuation
of dialysis.

[Series 1: run · 2 of 2 slices shown]
[im 1/2]
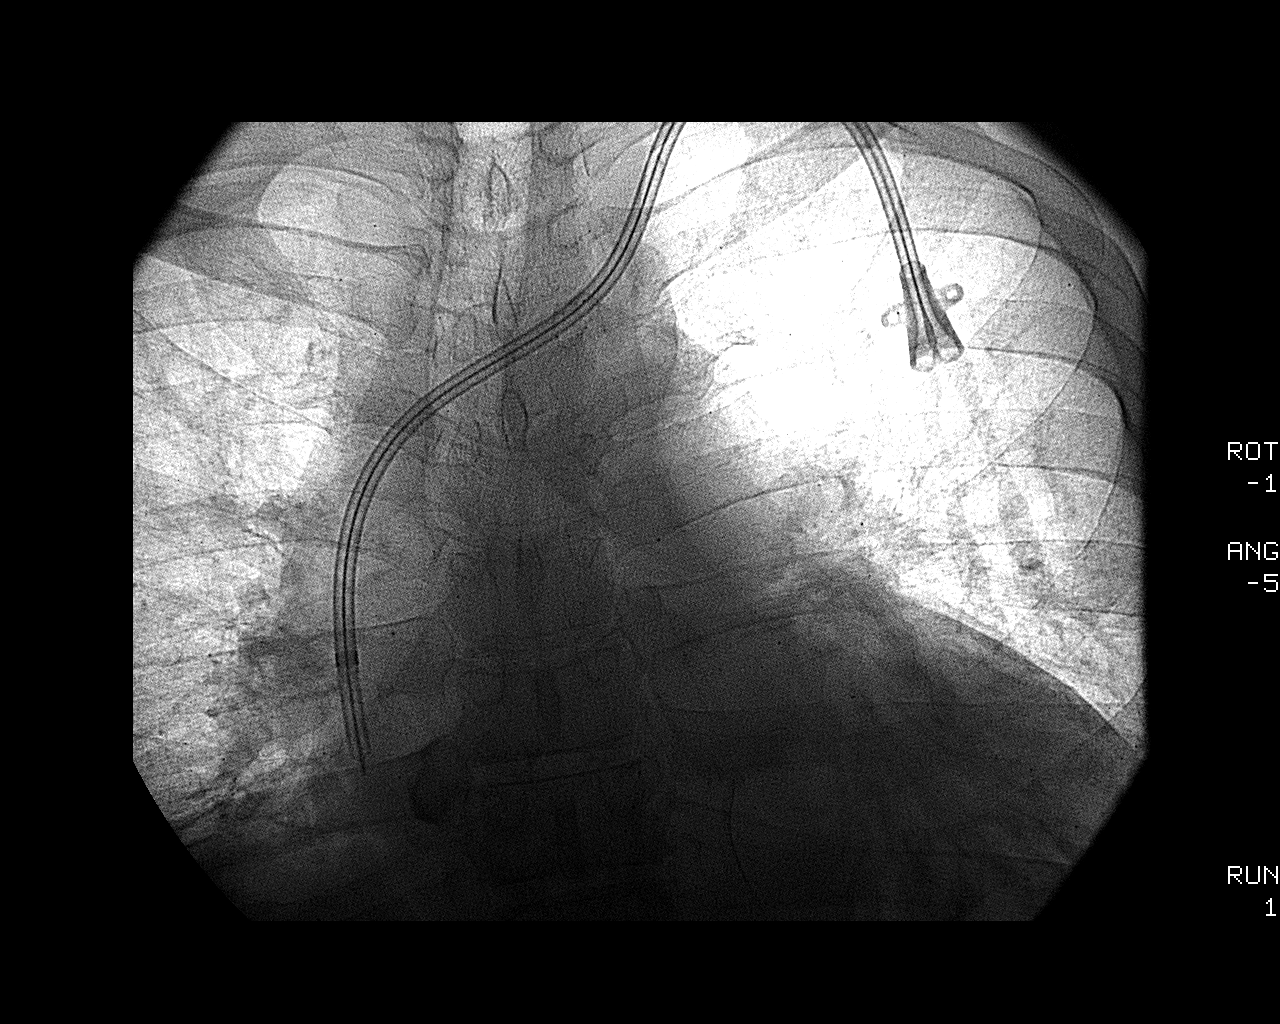
[im 2/2]
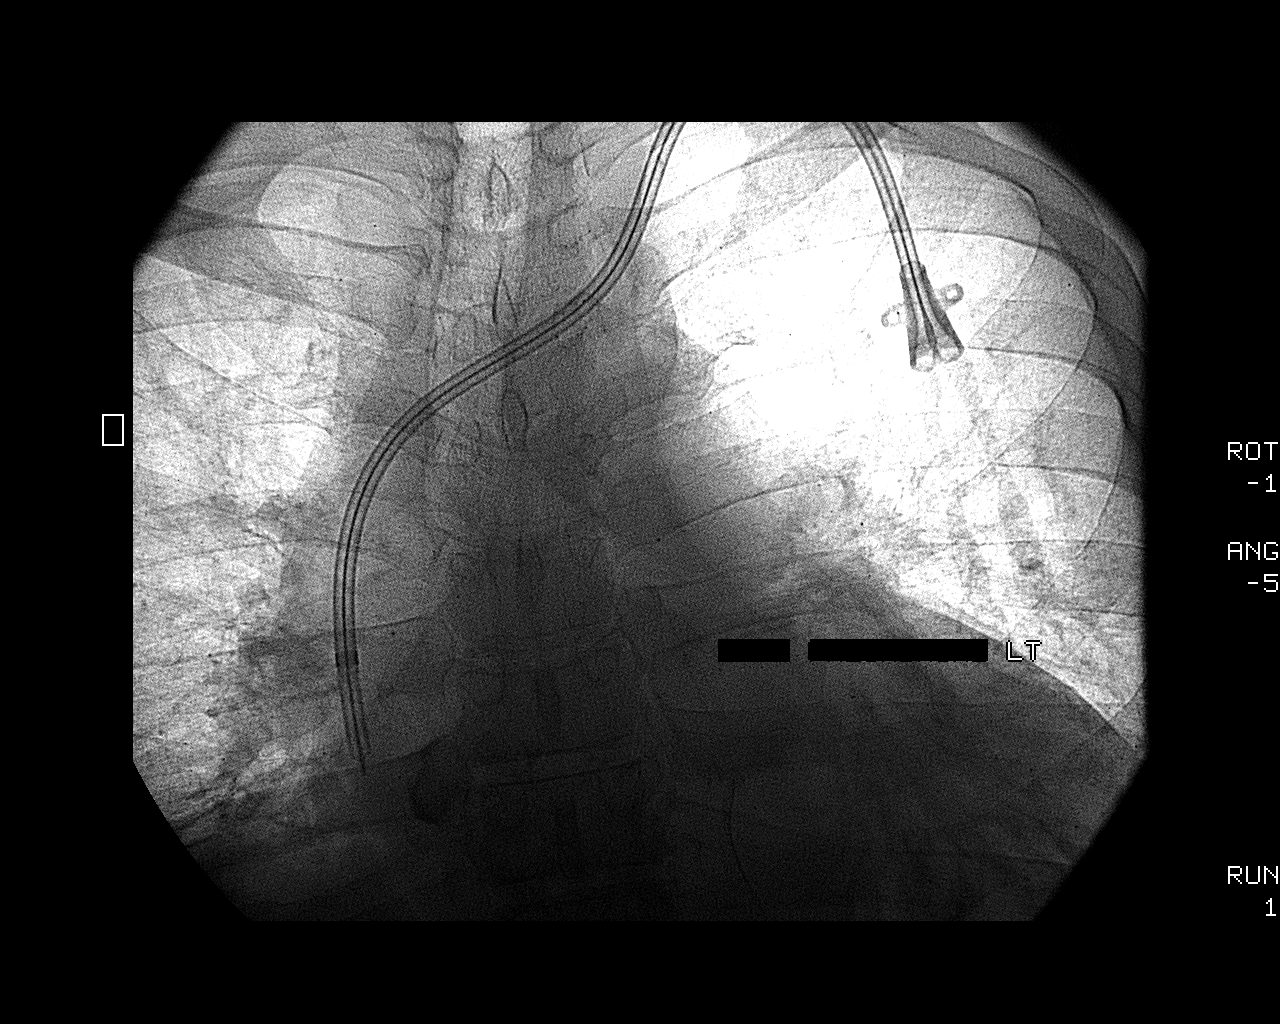

[2 of 2 positions shown; findings below may reference images not displayed]

EXAM:
TUNNELED CENTRAL VENOUS HEMODIALYSIS CATHETER PLACEMENT WITH
ULTRASOUND AND FLUOROSCOPIC GUIDANCE

MEDICATIONS:
Ancef 2 gm IV; The IV antibiotic was given in an appropriate time
interval prior to skin puncture.

CONTRAST:  None

ANESTHESIA/SEDATION:
Versed 1 mg IV; Fentanyl 50 mcg IV

Total Moderate Sedation Time

11 minutes.

FLUOROSCOPY TIME:  54 seconds.

COMPLICATIONS:
None immediate

PROCEDURE:
Informed written consent was obtained from the patient after a
discussion of the risks, benefits, and alternatives to treatment.
Questions regarding the procedure were encouraged and answered.

Given concern for recent infection involving the right internal
jugular approach dialysis catheter, the decision was made to place a
left internal jugular approach dialysis catheter. As such, the left
neck and chest were prepped with chlorhexidine in a sterile fashion,
and a sterile drape was applied covering the operative field.
Maximum barrier sterile technique with sterile gowns and gloves were
used for the procedure. A timeout was performed prior to the
initiation of the procedure.

After creating a small venotomy incision, a micropuncture kit was
utilized to access the left internal jugular vein under direct,
real-time ultrasound guidance after the overlying soft tissues were
anesthetized with 1% lidocaine with epinephrine. Ultrasound image
documentation was performed. The microwire was kinked to measure
appropriate catheter length. A stiff Glidewire was advanced to the
level of the IVC and the micropuncture sheath was exchanged for a
peel-away sheath. A Palindrome tunneled hemodialysis catheter
measuring 23 cm from tip to cuff was tunneled in a retrograde
fashion from the anterior chest wall to the venotomy incision.

The catheter was then placed through the peel-away sheath with tips
ultimately positioned within the superior aspect of the right
atrium. Final catheter positioning was confirmed and documented with
a spot radiographic image. The catheter aspirates and flushes
normally. The catheter was flushed with appropriate volume heparin
dwells.

The catheter exit site was secured with a 0-Prolene retention
suture. The venotomy incision was closed with an interrupted 4-0
Vicryl, Dermabond and Nomasibulele. Dressings were applied. The
patient tolerated the procedure well without immediate post
procedural complication.
IMPRESSION: Successful placement of 23 cm tip to cuff tunneled hemodialysis
catheter via the left internal jugular vein with tips terminating
within the superior aspect of the right atrium. The catheter is
ready for immediate use.

## 2016-03-16 IMAGING — MR MR HIP*L* W/O CM
4 of 5 series · 19 of 40 positions shown · IV contrast (Y)
Comparison: 08/15/2014

CLINICAL DATA: Left hip pain radiating down the leg.

EXAM:
MR OF THE LEFT HIP WITHOUT CONTRAST
TECHNIQUE: Multiplanar, multisequence MR imaging was performed. No intravenous
contrast was administered.

[Series 3: T1 · axial · 6.0mm · 0.80mm/px · z∈[-44,+54]mm · 3 of 20 slices shown (1 of 2)]
[im 3/20]
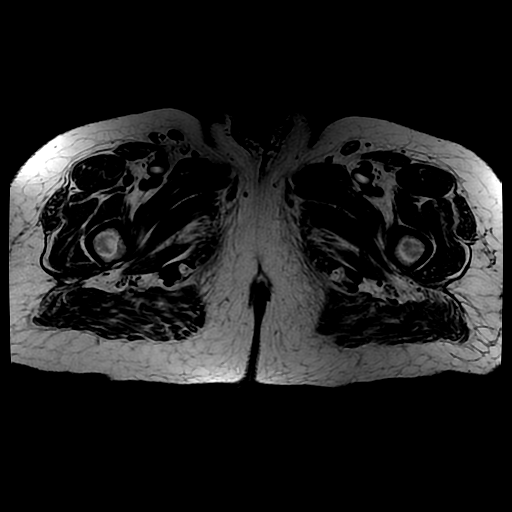
[im 11/20]
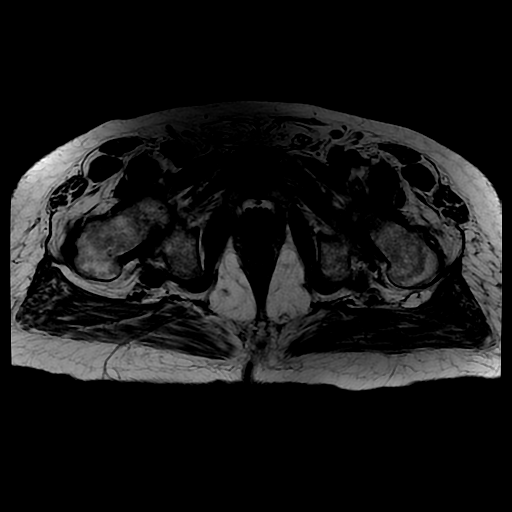
[im 17/20]
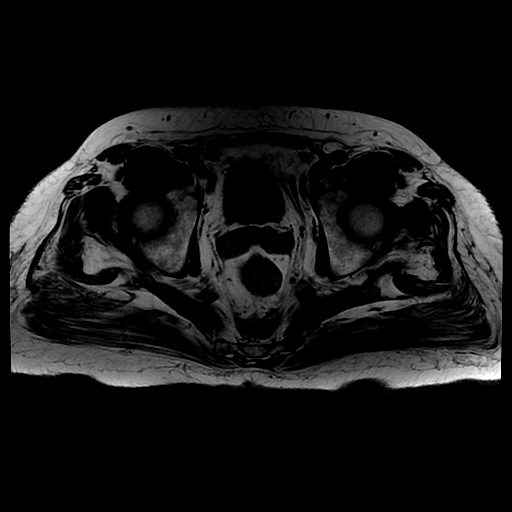

[Series 4: T2 fat-sat · axial · 6.0mm · 0.80mm/px · z∈[-58,+47]mm · 4 of 20 slices shown]
[im 1/20]
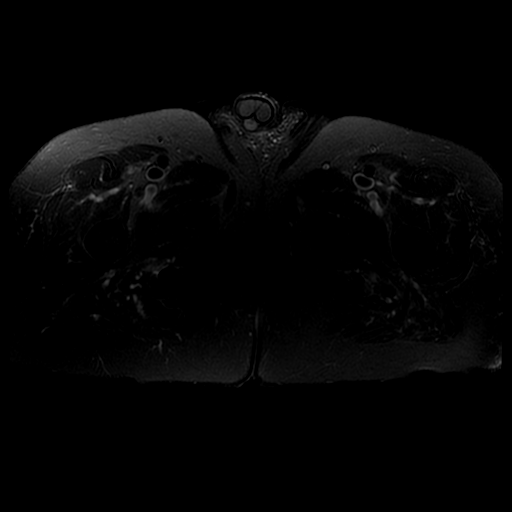
[im 4/20]
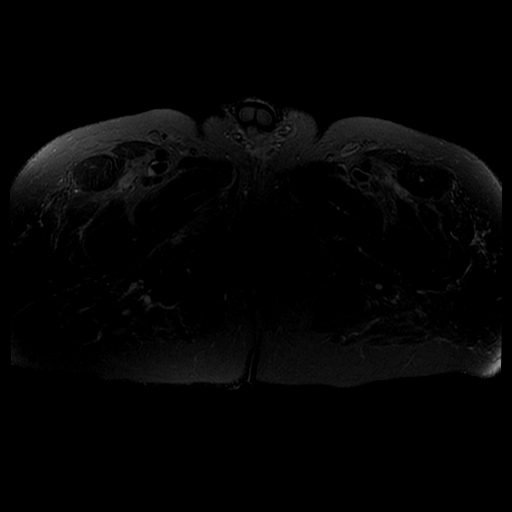
[im 10/20]
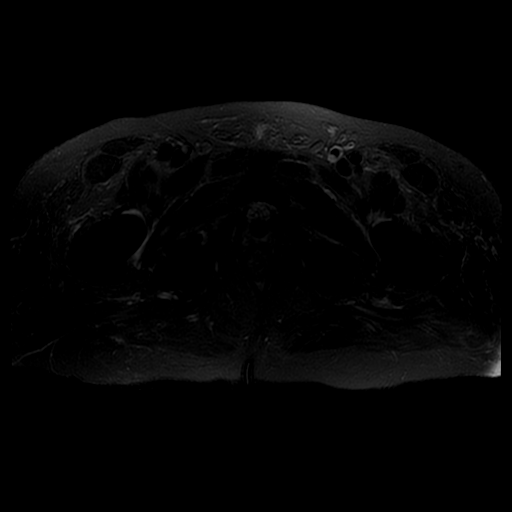
[im 16/20]
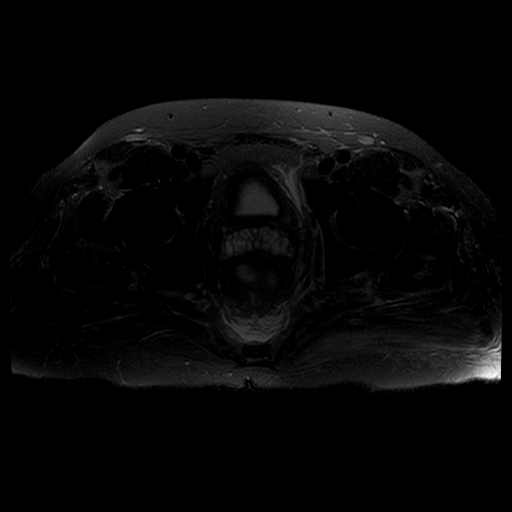

[Series 5: T1 · coronal · 5.0mm · 0.80mm/px · 3 of 22 slices shown (2 of 2)]
[im 4/22]
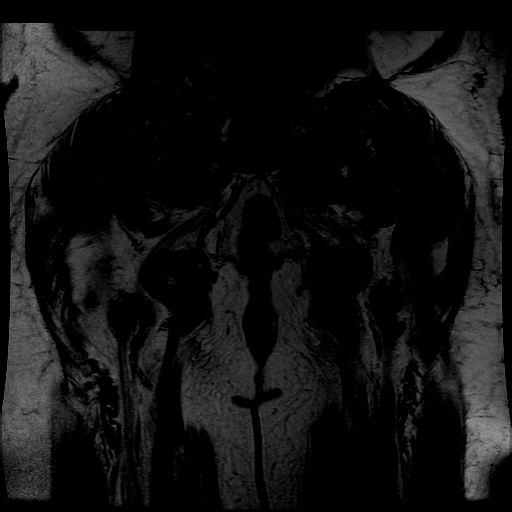
[im 13/22]
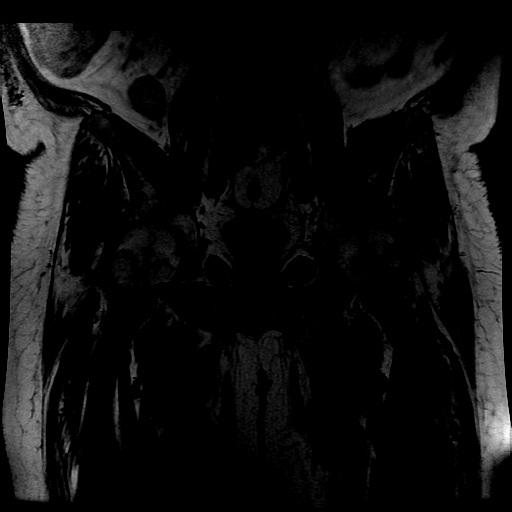
[im 19/22]
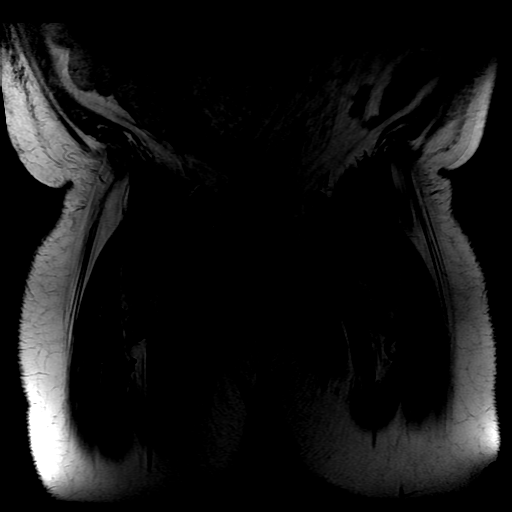

[Series 7: PD · sagittal · 4.0mm · 0.33mm/px · 9 of 23 slices shown]
[im 1/23]
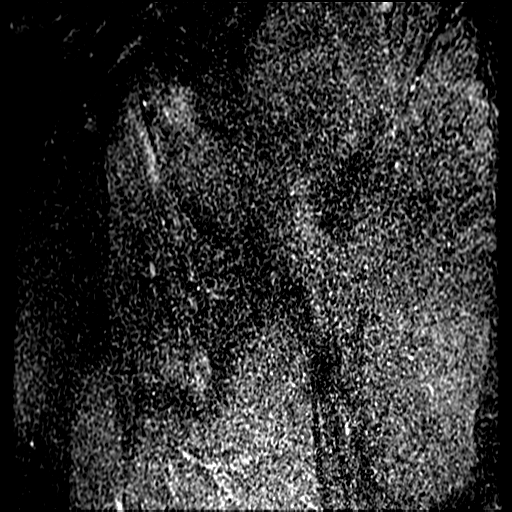
[im 3/23]
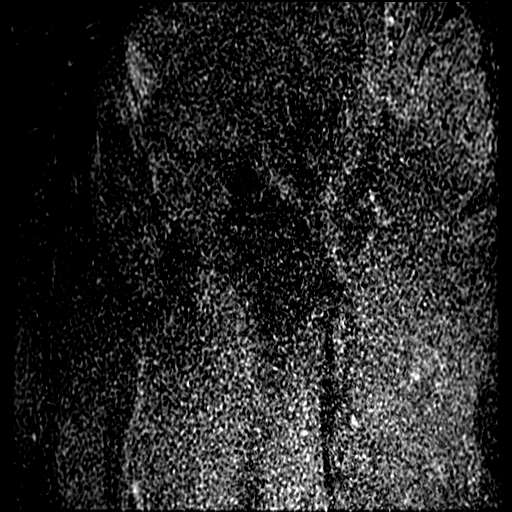
[im 6/23]
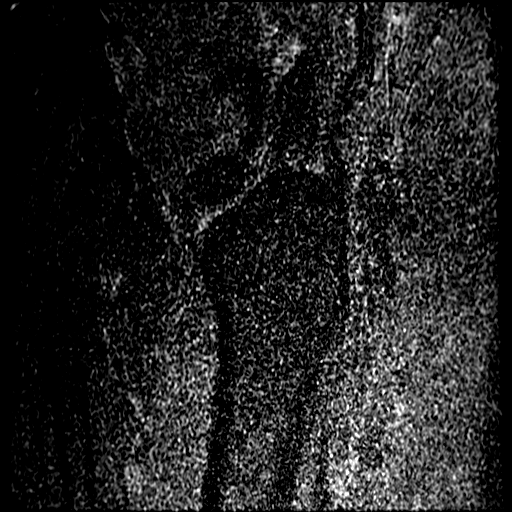
[im 9/23]
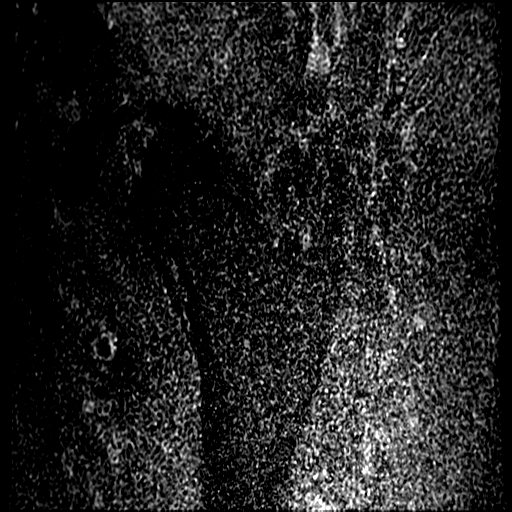
[im 12/23]
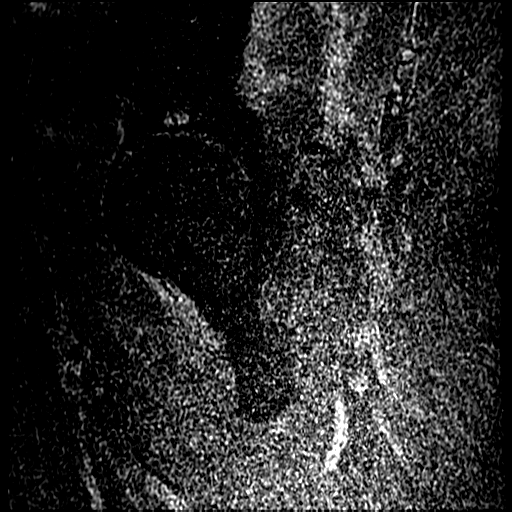
[im 14/23]
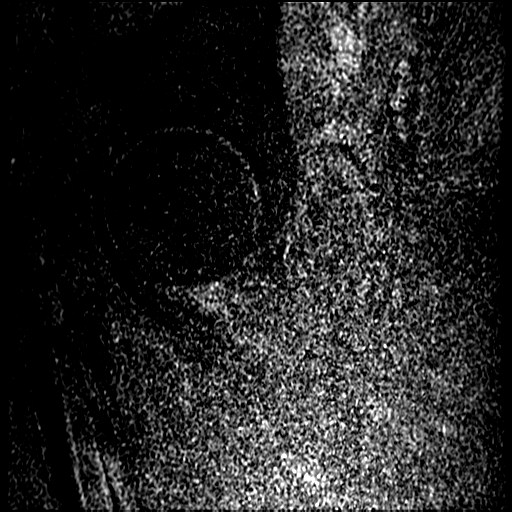
[im 17/23]
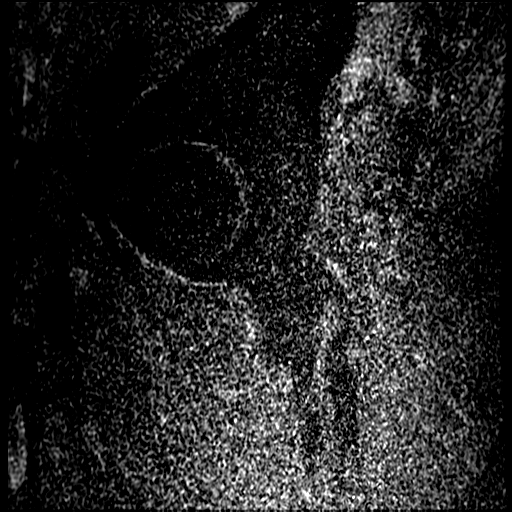
[im 20/23]
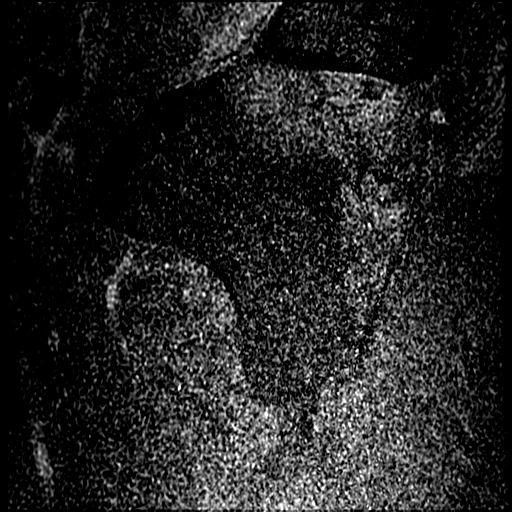
[im 23/23]
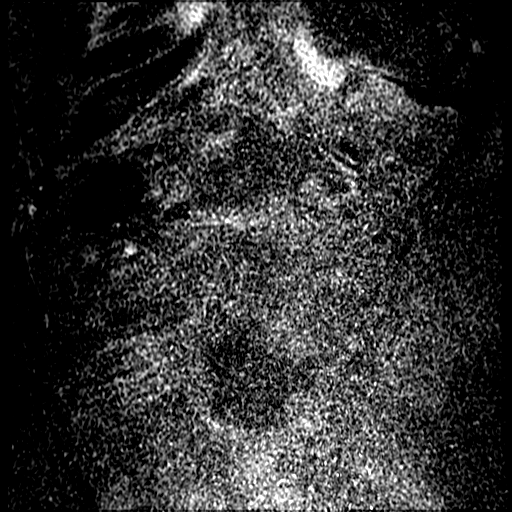

[19 of 40 positions shown; findings below may reference images not displayed]

FINDINGS: Bones: Abnormal left sacroiliac joint with joint effusion inferiorly
(image 5, series 6), and abnormal edema signal along the margins of
the joint, especially the iliac side. Subtle edema anteriorly in the
left pubic body.

Articular cartilage and labrum

Articular cartilage: Mild to moderate but symmetric degenerative
articular cartilage thinning. Subcortical cyst formation laterally
in the acetabulum.

Labrum:  Grossly intact

Joint or bursal effusion

Joint effusion:  Absent

Bursae:  Unremarkable

Muscles and tendons

Muscles and tendons: There is abnormal edema tracking along tissue
planes adjacent to the left sacroiliac joint, including deep to the
left iliacus muscle, the sciatic notch, and adjacent left gluteal
musculature. Presacral fluid noted along with edema signal in the
left piriformis muscle. Low-level but symmetric edema tracks along
the hip adductor musculature. Mild proximal hamstring tendinopathy
on the left.

Other findings

Miscellaneous: Transitional lumbosacral vertebra with broad
transverse processes articulating with the rest of the sacrum.
IMPRESSION: 1. Abnormal edema along the left sacroiliac joint with a small SI
joint effusion and edema tracking along adjacent muscular and tissue
planes. Appearance concerning for septic left sacroiliac joint.
2. Bilateral degenerative arthropathy of the hips.
3. Proximal hamstring tendinopathy on the left.

## 2017-04-30 HISTORY — PX: COLONOSCOPY WITH ESOPHAGOGASTRODUODENOSCOPY (EGD): SHX5779

## 2017-07-02 ENCOUNTER — Emergency Department (HOSPITAL_COMMUNITY): Payer: Medicare Other

## 2017-07-02 ENCOUNTER — Emergency Department (HOSPITAL_COMMUNITY)
Admission: EM | Admit: 2017-07-02 | Discharge: 2017-07-02 | Disposition: A | Discharge status: Home / Self Care | Payer: Medicare Other | Attending: Emergency Medicine | Admitting: Emergency Medicine

## 2017-07-02 ENCOUNTER — Encounter (HOSPITAL_COMMUNITY): Payer: Self-pay

## 2017-07-02 DIAGNOSIS — E1122 Type 2 diabetes mellitus with diabetic chronic kidney disease: Secondary | ICD-10-CM | POA: Diagnosis not present

## 2017-07-02 DIAGNOSIS — R000 Tachycardia, unspecified: Secondary | ICD-10-CM | POA: Insufficient documentation

## 2017-07-02 DIAGNOSIS — N186 End stage renal disease: Secondary | ICD-10-CM | POA: Insufficient documentation

## 2017-07-02 DIAGNOSIS — Z992 Dependence on renal dialysis: Secondary | ICD-10-CM | POA: Diagnosis not present

## 2017-07-02 DIAGNOSIS — Z79899 Other long term (current) drug therapy: Secondary | ICD-10-CM | POA: Diagnosis not present

## 2017-07-02 DIAGNOSIS — I120 Hypertensive chronic kidney disease with stage 5 chronic kidney disease or end stage renal disease: Secondary | ICD-10-CM | POA: Diagnosis not present

## 2017-07-02 LAB — CBC
HCT: 33.1 % — ABNORMAL LOW (ref 39.0–52.0)
Hemoglobin: 10.9 g/dL — ABNORMAL LOW (ref 13.0–17.0)
MCH: 32.2 pg (ref 26.0–34.0)
MCHC: 32.9 g/dL (ref 30.0–36.0)
MCV: 97.9 fL (ref 78.0–100.0)
PLATELETS: 104 10*3/uL — AB (ref 150–400)
RBC: 3.38 MIL/uL — AB (ref 4.22–5.81)
RDW: 14.8 % (ref 11.5–15.5)
WBC: 4.3 10*3/uL (ref 4.0–10.5)

## 2017-07-02 LAB — COMPREHENSIVE METABOLIC PANEL
ALT: 21 U/L (ref 17–63)
AST: 47 U/L — AB (ref 15–41)
Albumin: 3 g/dL — ABNORMAL LOW (ref 3.5–5.0)
Alkaline Phosphatase: 42 U/L (ref 38–126)
Anion gap: 15 (ref 5–15)
BUN: 35 mg/dL — AB (ref 6–20)
CO2: 26 mmol/L (ref 22–32)
Calcium: 8.9 mg/dL (ref 8.9–10.3)
Chloride: 92 mmol/L — ABNORMAL LOW (ref 101–111)
Creatinine, Ser: 9.1 mg/dL — ABNORMAL HIGH (ref 0.61–1.24)
GFR calc Af Amer: 6 mL/min — ABNORMAL LOW (ref >60)
GFR, EST NON AFRICAN AMERICAN: 6 mL/min — AB (ref >60)
GLUCOSE: 90 mg/dL (ref 65–99)
POTASSIUM: 4 mmol/L (ref 3.5–5.1)
Sodium: 133 mmol/L — ABNORMAL LOW (ref 135–145)
Total Bilirubin: 0.8 mg/dL (ref 0.3–1.2)
Total Protein: 6.5 g/dL (ref 6.5–8.1)

## 2017-07-02 MED ORDER — ONDANSETRON 4 MG PO TBDP: 4.0000 mg | ORAL_TABLET | Freq: Three times a day (TID) | ORAL | 0 refills | Status: DC | PRN

## 2017-07-02 MED ORDER — ONDANSETRON 4 MG PO TBDP
4.0000 mg | ORAL_TABLET | Freq: Once | ORAL | Status: AC
  Administered 2017-07-02: 4 mg via ORAL
  Filled 2017-07-02: qty 1

## 2017-07-02 NOTE — Discharge Instructions (Signed)
Follow-up with dialysis tomorrow for continuation of your treatment. You may take Zofran as needed for nausea or vomiting. Follow-up with dialysis regarding blood cultures and antibiotic use. Return to the emergency department if you develop fevers, chills, chest pain, shortness of breath, or any new or worsening symptoms.

## 2017-07-02 NOTE — ED Provider Notes (Signed)
[facility] DEPT Provider Note   CSN: [id number] Arrival date & time: 07/02/17  1345     History   Chief Complaint Chief Complaint  Patient presents with  . Tachycardia    HPI Noah Vang is a 61 y.o. male presenting with tachycardia.  Patient was at dialysis (M/W/F) when he had episodes of tachycardia. He had similar events on Wednesday, when he also presented with a fever of 101.6. At that time, blood cultures were drawn, and patient was started on antibiotics with concern for spinal MRSA (h/o of same). Today, when patient's heart rate increased, he was transported to the emergency room. He only received half of his dialysis treatment. He denies current fevers, chills, chest pain, shortness of breath, feeling of palpitations, abdominal pain, or abnormal bowel movements. He does not produce urine. He reports some nausea, but no vomiting. When asked about back pain, patient states that he's had pain for 3-4 years, and it does not feel and different today. He reports a cough several days ago, but no current congestion, sore throat, or cough. He denies sick contacts. He denies dizziness, weakness, or change in activity level.  HPI  Past Medical History:  Diagnosis Date  . Diabetes mellitus without complication ([place])   . Dialysis patient ([place])   . Hypertension   . Renal disorder     Patient Active Problem List   Diagnosis Date Noted  . Left leg weakness   . Septic arthritis of sacroiliac joint ([place])   . Type 2 diabetes mellitus with diabetic chronic kidney disease ([place])   . Staphylococcus aureus bacteremia with sepsis ([place])   . Spinal stenosis of lumbar region   . Screen for STD (sexually transmitted disease)   . Atrial fibrillation ([place]) 09/29/2014  . CRBSI (catheter-related bloodstream infection) 09/28/2014  . ESRD (end stage renal disease) on dialysis ([place]) 09/28/2014  . DM (diabetes mellitus) ([place]) 09/28/2014  . HTN (hypertension) 09/28/2014  . SVT (supraventricular  tachycardia) ([place]) 09/28/2014    Past Surgical History:  Procedure Laterality Date  . TEE WITHOUT CARDIOVERSION N/A 10/03/2014   Procedure: TRANSESOPHAGEAL ECHOCARDIOGRAM (TEE);  Surgeon: Thayer Headings, MD;  Location: [facility];  Service: Cardiovascular;  Laterality: N/A;       Home Medications    Prior to Admission medications   Medication Sig Start Date End Date Taking? Authorizing Provider  aspirin EC 81 MG tablet Take 1 tablet (81 mg total) by mouth daily. Patient not taking: Reported on 07/02/2017 10/08/14   Debbe Odea, MD  carvedilol (COREG) 6.25 MG tablet Take 1 tablet (6.25 mg total) by mouth 2 (two) times daily with a meal. Patient not taking: Reported on 07/02/2017 10/08/14   Debbe Odea, MD  cloNIDine (CATAPRES) 0.1 MG tablet Take 1 tablet (0.1 mg total) by mouth 3 (three) times daily. Patient not taking: Reported on 07/02/2017 10/06/14   Kinnie Feil, MD  doxazosin (CARDURA) 4 MG tablet Take 4 mg by mouth at bedtime.    [provider]  gabapentin (NEURONTIN) 100 MG capsule Take 100 mg by mouth 3 (three) times daily.    [provider]  insulin glargine (LANTUS) 100 UNIT/ML injection Inject 0.05 mLs (5 Units total) into the skin daily. Patient not taking: Reported on 07/02/2017 10/06/14   Kinnie Feil, MD  lanthanum (FOSRENOL) 750 MG chewable tablet Chew 750 mg by mouth 3 (three) times daily with meals.    [provider]  lisinopril (PRINIVIL,ZESTRIL) 40 MG tablet Take 40 mg by  mouth daily.    [provider]  ondansetron (ZOFRAN ODT) 4 MG disintegrating tablet Take 1 tablet (4 mg total) by mouth every 8 (eight) hours as needed for nausea or vomiting. 07/02/17   Khandi Kernes, PA-C  Vitamin D, Ergocalciferol, (DRISDOL) 50000 UNITS CAPS capsule Take 50,000 Units by mouth every 7 (seven) days. On Thursday    [provider]    Family History No family history on file.  Social History Social History  Substance Use  Topics  . Smoking status: Never Smoker  . Smokeless tobacco: Not on file  . Alcohol use No     Allergies   Vancomycin   Review of Systems Review of Systems  Cardiovascular:       Tachycardia  All other systems reviewed and are negative.    Physical Exam Updated Vital Signs BP 105/61 (BP Location: Right Arm)   Pulse 76   Temp 98.5 F (36.9 C) (Oral)   Resp 16   SpO2 100%   Physical Exam  Constitutional: He is oriented to person, place, and time. He appears well-developed and well-nourished. No distress.  HENT:  Head: Normocephalic and atraumatic.  Eyes: Pupils are equal, round, and reactive to light. Conjunctivae and EOM are normal.  Neck: Normal range of motion. Neck supple.  Cardiovascular: Regular rhythm and intact distal pulses.  Tachycardia present.   Pulmonary/Chest: Effort normal and breath sounds normal. No respiratory distress. He has no wheezes.  Abdominal: Soft. Bowel sounds are normal. He exhibits no distension and no mass. There is no tenderness. There is no rebound and no guarding.  Musculoskeletal: Normal range of motion.  Lymphadenopathy:    He has no cervical adenopathy.  Neurological: He is alert and oriented to person, place, and time.  Skin: Skin is warm and dry. He is not diaphoretic.  Psychiatric: He has a normal mood and affect.  Nursing note and vitals reviewed.    ED Treatments / Results  Labs (all labs ordered are listed, but only abnormal results are displayed) Labs Reviewed  CBC - Abnormal; Notable for the following:       Result Value   RBC 3.38 (*)    Hemoglobin 10.9 (*)    HCT 33.1 (*)    Platelets 104 (*)    All other components within normal limits  COMPREHENSIVE METABOLIC PANEL - Abnormal; Notable for the following:    Sodium 133 (*)    Chloride 92 (*)    BUN 35 (*)    Creatinine, Ser 9.10 (*)    Albumin 3.0 (*)    AST 47 (*)    GFR calc non Af Amer 6 (*)    GFR calc Af Amer 6 (*)    All other components within normal  limits    EKG  EKG Interpretation  Date/Time:  Friday July 02 2017 13:55:52 EDT Ventricular Rate:  115 PR Interval:    QRS Duration: 88 QT Interval:  326 QTC Calculation: 451 R Axis:   -13 Text Interpretation:  Sinus tachycardia Anterior infarct, old Since last tracing of earlier today No significant change was found Confirmed by Daleen Bo [phone]) on 07/02/2017 2:15:14 PM       Radiology Dg Chest 2 View  Result Date: 07/02/2017 CLINICAL DATA:  Tachycardia during dialysis. Fever (Wed/Thurs). Cough (Wed). Headache, and Back aches. Hx HTN, and diabetes and A-Fib. EXAM: CHEST  2 VIEW COMPARISON:  Chest x-rays dated 09/26/2014 in 08/1929 2015. FINDINGS: Heart size is upper normal. Lungs are clear.  No pleural effusion or pneumothorax seen. Compression fracture deformities of 2 adjacent vertebral bodies within the lower thoracic spine, of uncertain age but new compared to most recent chest x-ray of 09/26/2014, possibly with associated ankylosis which would indicate chronic fractures. IMPRESSION: 1. No active cardiopulmonary disease. No evidence of pneumonia or pulmonary edema. 2. Compression fracture deformities of 2 adjacent vertebral bodies within the lower thoracic spine, favor chronic but of uncertain age and new compared to most recent chest x-ray of 09/26/2014. If any recent trauma or midline back pain in the vicinity of the thoracolumbar junction, would consider dedicated plain film of the thoracic and lumbar spine for further characterization. Electronically Signed   By: Franki Cabot M.D.   On: 07/02/2017 16:41    Procedures Procedures (including critical care time)  Medications Ordered in ED Medications  ondansetron (ZOFRAN-ODT) disintegrating tablet 4 mg (4 mg Oral Given 07/02/17 1640)     Initial Impression / Assessment and Plan / ED Course  I have reviewed the triage vital signs and the nursing notes.  Pertinent labs & imaging results that were available during my care of  the patient were reviewed by me and considered in my medical decision making (see chart for details).     Pt with tachycardia, but not complaining of CP, SOB, fevers, chill, or pain. Pt reports some nausea, which is normal for his post-dialysis. Physical exam showed HR at 115, otherwise reassuring. Will order basic labs, CXR and EKG.    Labs reassuring. Stable anemia (improved), electrolytes reassuring, and no elevated of WBC. Nausea improved with zofran. Denies further sxs. Case discussed with attending, Dr. Roderic Palau, and Velva Harman, NP with dialysis clinic. Pt will continue dialysis tomorrow, and clinic will f/u regarding blood cultures and abx treatment. Pt appears safe for discharge at this time. Pt requests rx for nausea. Discussed plan with pt. Return precautions given. Pt states he understands and agrees to plan.   Final Clinical Impressions(s) / ED Diagnoses   Final diagnoses:  Tachycardia    New Prescriptions Discharge Medication List as of 07/02/2017  5:07 PM    START taking these medications   Details  ondansetron (ZOFRAN ODT) 4 MG disintegrating tablet Take 1 tablet (4 mg total) by mouth every 8 (eight) hours as needed for nausea or vomiting., Starting Fri 07/02/2017, Print         [place], Aleeah Greeno, PA-C 07/02/17 2325    Milton Ferguson, MD 07/03/17 1443

## 2017-07-02 NOTE — ED Notes (Signed)
Patient transported to X-ray 

## 2017-07-02 NOTE — ED Notes (Signed)
Pt in radiology 

## 2017-07-02 NOTE — ED Triage Notes (Signed)
Pt presents from dialysis for evaluation of possible infection and concern of tachycardia. Pt received approximatley 1/2 of dialysis treatment. PT reports was given IV abx over past few treatments for low grade fever, no abx today. Pt denies pain or cough but endorses chills/fever at home. Pt afebrile in triage. Pt AxO x4.

## 2017-07-02 NOTE — ED Notes (Signed)
Patient able to ambulate independently  

## 2017-10-01 DIAGNOSIS — T82838A Hemorrhage of vascular prosthetic devices, implants and grafts, initial encounter: Secondary | ICD-10-CM

## 2017-10-01 NOTE — ED Notes (Signed)
Dr. Aura Campsui at bedside for assessment.     Colan Neptuneorey S Aneshia Jacquet, RN  10/01/17 2216

## 2017-10-01 NOTE — ED Triage Notes (Signed)
Patient presents to ER with bleeding dialysis fistula from left upper arm that occurred today at 2140. Pressure was being help by patient on fistula. Patient states he normally has to hold pressure after dialysis for 15 minutes, but only held this time for 12 minutes.

## 2017-10-01 NOTE — ED Notes (Signed)
Per verbal order from Dr. Aura Campsui, help pressure on fistula or left arm for 20 minutes. Bleeding controlled. Dressing changed and taped. Bruit and thrill noted.     Colan Neptuneorey S Kelijah Towry, RN  10/01/17 2217

## 2017-10-01 NOTE — ED Provider Notes (Signed)
[facility]  eMERGENCY dEPARTMENT eNCOUnter          CHIEF COMPLAINT       Chief Complaint   Patient presents with   . Vascular Access Problem     bleeding from dialysis fistula, post dialysis       Nurses Notes reviewed and I agreeexcept as noted in the HPI.      HISTORY OF PRESENT ILLNESS    Andre Cox is a 61 y.o. male who presents toEmergency Department with bleeding from AV fistula.    He is on HD due to ESRD with HD schedule M-W-F. He finished HD today round 9 PM and normally he holds pressure at AV fistular for 20 mins. Today he only hold for 12 minutes and noticed there is bleeding from left arm AV fistula site. No hematoma. He went to ED for help.     No arm pain. No other complaints.       REVIEW OF SYSTEMS     Review of Systems   Constitutional: Negative for activity change, appetite change, chills, fatigue and fever.   HENT: Negative for congestion, ear discharge, hearing loss, nosebleeds and rhinorrhea.    Eyes: Negative for discharge and itching.   Respiratory: Negative for cough, shortness of breath and wheezing.    Cardiovascular: Negative for chest pain.        Bleeding form left arm AV fistula.    Gastrointestinal: Negative for abdominal distention, abdominal pain, diarrhea, nausea and vomiting.   Endocrine: Negative for cold intolerance.   Genitourinary: Negative for flank pain and genital sores.   Musculoskeletal: Negative for arthralgias, myalgias, neck pain and neck stiffness.   Skin: Negative for rash and wound.   Neurological: Negative for dizziness and weakness.   Psychiatric/Behavioral: Negative for agitation and suicidal ideas.          PAST MEDICAL HISTORY    has no past medical history on file.    SURGICAL HISTORY      has no past surgical history on file.    CURRENT MEDICATIONS       There are no discharge medications for this patient.      ALLERGIES     has No Known Allergies.    FAMILY HISTORY     has no family status information on file.    family history is  not on file.    SOCIAL HISTORY          PHYSICAL EXAM     INITIAL VITALS:  height is 6\' 2"  (1.88 m) and weight is 229 lb 4.5 oz (104 kg). His oral temperature is 98.3 F (36.8 C). His blood pressure is 141/82 (abnormal) and his pulse is 119. His respiration is 18 and oxygen saturation is 98%.    Physical Exam   Constitutional: He is oriented to person, place, and time. He appears well-developed and well-nourished.   HENT:   Head: Normocephalic and atraumatic.   Eyes: Pupils are equal, round, and reactive to light. Right eye exhibits no discharge. Left eye exhibits no discharge. No scleral icterus.   Neck: Normal range of motion. Neck supple. No tracheal deviation present.   Cardiovascular: Normal rate, regular rhythm and normal heart sounds.  Exam reveals no gallop and no friction rub.    No murmur heard.  Pulmonary/Chest: Effort normal. No stridor. No respiratory distress. He has no wheezes.   Abdominal: Soft. There is no tenderness. There is no rebound and no guarding.  Musculoskeletal: Normal range of motion. He exhibits no edema or tenderness.   Blood is oozing from left upper arm AV fistula w/o hematoma or active pulsating bleeding. Left UE has intact NV exam.   Neurological: He is alert and oriented to person, place, and time. No cranial nerve deficit. Coordination normal.   Skin: Skin is warm and dry. He is not diaphoretic.   Psychiatric: He has a normal mood and affect. His behavior is normal. Judgment and thought content normal.   Nursing note and vitals reviewed.        DIFFERENTIAL DIAGNOSIS:   AV fistula bleeding    DIAGNOSTIC RESULTS     EKG: All EKG's are interpreted by the Emergency Department Physician who either signs or Co-signsthis chart in the absence of a cardiologist.  None    RADIOLOGY: non-plain film images(s) such as CT, Ultrasound and MRI are read by the radiologist.  No results found.    No orders to display     [] Visualized and interpreted by me   []  Radiologist's Wet Read Report  Reviewed   []  Discussed with Radiologist.    LABS:   No results found for this visit on 10/01/17.    EMERGENCY DEPARTMENT COURSE:   Vitals:    Vitals:    10/01/17 2210   BP: (!) 141/82   Pulse: 119   Resp: 18   Temp: 98.3 F (36.8 C)   TempSrc: Oral   SpO2: 98%   Weight: 229 lb 4.5 oz (104 kg)   Height: 6\' 2"  (1.88 m)       9:54 PM    Patient is seen and evaluated in a timely fashion.     MedicalDecision Making    No pulsating hemorrhage from left upper arm AV fistula. Pressure is applied to AV fistula by nurse for another 20 minutes with good hemostasis. Sterile dressings are applied to AV fistula.    He has tachycardia, she states this is a chronic problem and his heart rate normally is at 110-120. He refused ED workups including EKG.    Discharged with PCP follow-up in 3-5 days.     CRITICAL CARE:   None    CONSULTS:  None    PROCEDURES:  None    FINAL IMPRESSION      1. Hemorrhage due to vascular prosthetic devices, implants and grafts, initial encounter (HCC)    2. Tachycardia    3. Elevated blood pressure reading          DISPOSITION/PLAN   Home    PATIENT REFERRED TO:  your family doctor    In 3 days  recheck your heart rate. Your heart rate should be less than 100.      DISCHARGE MEDICATIONS:  There are no discharge medications for this patient.      (Please note that portions of this note were completed with a voice recognition program.  Efforts were made to edit the dictations but occasionally words aremis-transcribed.)    Burna Cash, MD          Burna Cash, MD  10/02/17 [phone]

## 2017-10-01 NOTE — Discharge Instructions (Signed)
Hold pressure for 20 minutes if bleeding recurs.

## 2017-10-02 ENCOUNTER — Inpatient Hospital Stay
Admit: 2017-10-02 | Discharge: 2017-10-02 | Disposition: A | Discharge status: Home / Self Care | Payer: MEDICARE | Attending: Emergency Medicine

## 2017-12-03 ENCOUNTER — Emergency Department: Admit: 2017-12-04 | Payer: MEDICARE

## 2017-12-03 DIAGNOSIS — N186 End stage renal disease: Principal | ICD-10-CM

## 2017-12-03 NOTE — ED Notes (Signed)
Pt is resting on cart with call light in reach. Pt is assisted to the bathroom. Will continue to monitor the patient.     Theron Arista, RN  12/03/17 [phone]

## 2017-12-03 NOTE — ED Provider Notes (Signed)
[facility]  eMERGENCY dEPARTMENT eNCOUnter          CHIEF COMPLAINT       Chief Complaint   Patient presents with   . Tachycardia       Nurses Notes reviewed and I agree except as noted in the HPI.      HISTORY OF PRESENT ILLNESS    Andre Cox is a 62 y.o. male with a past medical history of DM, HTN, and ESRD on hemodialysis who presents to the ED for evaluation of tachycardia. The patient states that he had dialysis today and was noted to be tachycardic in the 120s after the dialysis. Patient states that his heart rate remained tachycardic even after receiving IV fluids at the dialysis clinic. The patient denies any chest pain, shortness of breath, nausea, vomiting, abdominal pain, or palpitations. He does report a two week history of rhinorrhea, sneezing, and nonproductive cough. No recent fevers. He is followed by Dr. Ailene ArdsUkiwe (nephrology). The patient states that he is not on medications for his HTN or DM. No additional complaints or concerns at the time of initial evaluation.      Location/Symptom: tachycardia  Timing/Onset: while at dialysis this afternoon   Context/Setting: became tachycardic after dialysis   Duration: persistent   Modifying Factors: none     REVIEW OF SYSTEMS     Review of Systems   Constitutional: Negative for chills, diaphoresis and fever.   HENT: Positive for rhinorrhea and sneezing. Negative for congestion and sore throat.    Respiratory: Positive for cough. Negative for shortness of breath and wheezing.    Cardiovascular: Negative for chest pain, palpitations and leg swelling.   Gastrointestinal: Negative for abdominal pain, blood in stool, constipation, diarrhea, nausea and vomiting.   Genitourinary: Negative for decreased urine volume, difficulty urinating, dysuria, frequency, hematuria and urgency.   Musculoskeletal: Negative for arthralgias, back pain, joint swelling, myalgias and neck pain.   Skin: Negative for rash.   Neurological: Negative for dizziness,  weakness, light-headedness, numbness and headaches.        PAST MEDICAL HISTORY    has a past medical history of Diabetes mellitus (HCC) and ESRD (end stage renal disease) (HCC).    SURGICAL HISTORY      has a past surgical history that includes Cholecystectomy and eye surgery.    CURRENT MEDICATIONS       Discharge Medication List as of 12/03/2017 11:55 PM      CONTINUE these medications which have NOT CHANGED    Details   DIGOX 125 MCG tablet TK 1 T PO QD, R-2, DAWHistorical Med             ALLERGIES     is allergic to other.    FAMILY HISTORY     has no family status information on file.    family history is not on file.    SOCIAL HISTORY      reports that he has never smoked. He has never used smokeless tobacco. He reports that he drinks alcohol. He reports that he does not use drugs.    PHYSICAL EXAM     INITIAL VITALS:  height is 6\' 2"  (1.88 m) and weight is 233 lb (105.7 kg). His oral temperature is 97.9 F (36.6 C). His blood pressure is 157/88 (abnormal) and his pulse is 106. His respiration is 18 and oxygen saturation is 98%.    Physical Exam   Constitutional: He is oriented to person, place, and  time. He appears well-developed and well-nourished. No distress.   HENT:   Head: Normocephalic and atraumatic.   Right Ear: External ear normal.   Left Ear: External ear normal.   Mouth/Throat: Oropharynx is clear and moist.   Eyes: Conjunctivae and EOM are normal.   Neck: Normal range of motion. Neck supple.   Cardiovascular: Regular rhythm, normal heart sounds and intact distal pulses.  Tachycardia present.  Exam reveals no friction rub.    No murmur heard.  Pulmonary/Chest: Effort normal and breath sounds normal. No respiratory distress. He has no wheezes. He has no rales. He exhibits no tenderness.   Abdominal: Soft. Bowel sounds are normal. He exhibits no distension. There is no tenderness. There is no rebound.   Musculoskeletal: Normal range of motion. He exhibits no edema.   Neurological: He is alert and  oriented to person, place, and time. He exhibits normal muscle tone.   Skin: Skin is warm and dry. No rash noted.   Nursing note and vitals reviewed.        DIFFERENTIAL DIAGNOSIS:   Hypovolemia, influenza,     DIAGNOSTIC RESULTS     EKG: All EKG's are interpreted by the Emergency Department Physician who either signs or Co-signs this chart in the absence of a cardiologist.    Vent. Rate: 116 bpm  PR interval: 194 ms  QRS duration: 80 ms  QTc: 439 ms  P-R-T axes: 28, 28, 42  Sinus tachycardia. No STEMI     RADIOLOGY: non-plain film images(s) such as CT, Ultrasound and MRI are readby theradiologist.    XR CHEST STANDARD (2 VW)   Final Result   1.. Mild cardiomegaly. No active pathology of the chest present.      **This report has been created using voice recognition software. It may contain minor errors which are inherent in voice recognition technology.**      Final report electronically signed by Dr. Helyn Numbers on 12/03/2017 11:36 PM           LABS:   Labs Reviewed   CBC WITH AUTO DIFFERENTIAL - Abnormal; Notable for the following:        Result Value    RBC 3.58 (*)     Hemoglobin 11.7 (*)     Hematocrit 35.6 (*)     MCV 99.4 (*)     RDW-SD 52.7 (*)     All other components within normal limits   BASIC METABOLIC PANEL - Abnormal; Notable for the following:     Sodium 134 (*)     Potassium 3.3 (*)     Chloride 90 (*)     Glucose 150 (*)     CREATININE 4.7 (*)     All other components within normal limits   ANION GAP - Abnormal; Notable for the following:     Anion Gap 19.0 (*)     All other components within normal limits   OSMOLALITY - Abnormal; Notable for the following:     Osmolality Calc 272.3 (*)     All other components within normal limits   GLOMERULAR FILTRATION RATE, ESTIMATED - Abnormal; Notable for the following:     Est, Glom Filt Rate 15 (*)     All other components within normal limits   CULTURE BLOOD #1   CULTURE BLOOD #2   RAPID INFLUENZA A/B ANTIGENS   HEPATIC FUNCTION PANEL        EMERGENCYDEPARTMENTCOURSE:   Vitals:    Vitals:    12/03/17 2140  12/03/17 2319   BP: 135/76 (!) 157/88   Pulse: 113 106   Resp: 17 18   Temp: 97.9 F (36.6 C)    TempSrc: Oral    SpO2: 98% 98%   Weight: 233 lb (105.7 kg)    Height: 6\' 2"  (1.88 m)      Patient was seen history physical exam was performed.  See disposition below    MDM:  The patient was seen and evaluated in the ED for tachycardia. He was placed on a cardiac monitor and an EKG was completed on his arrival in the ED which showed tachycardia to 113. Patient's physical exam was reassuring aside from the tachycardia. Labs and imaging studies were completed. Creatinine was elevated but within baseline. Patient was treated with IV fluids. Heart rate improved to the low 100s. Case discussed with Tommi Rumps. Hemmelgarn (nephrology) who reported that the patient was safe to discharge home if he received IV fluids and his heart rate was below 120. Patient was discharged home in stable condition. Anticipatory guidance given and patient is comfortable with plan of care. They will follow up with PCP for follow up or further evaluation if symptoms continue. They will return to the ED with worsening of symptoms and we discussed reasons for returning.     CRITICAL CARE:   None    CONSULTS:  Dr. Rikki Spearing (Nephrology)    PROCEDURES:  None    FINAL IMPRESSION      1. ESRD (end stage renal disease) (HCC)    2. Tachycardia          DISPOSITION/PLAN   Discharged     PATIENT REFERREDTO:  Marzetta Merino, MD  [address]  [place] [address]  [phone]    Schedule an appointment as soon as possible for a visit         DISCHARGE MEDICATIONS:  Discharge Medication List as of 12/03/2017 11:55 PM          (Please note that portions of this note were completed with a voice recognition program.  Efforts were made to edit the dictations but occasionally words are mis-transcribed.)    The patient was given an opportunity to see the Emergency Attending. The patient  voiced understanding that I was a Corporate treasurer and was in agreement with being seen independently by myself.     Scribe:  Pryor Curia 12/03/17 9:58 PM Scribing for and in the presence of [employer].    Signed by: Pryor Curia, Scribe, 12/04/17 12:12 AM    Provider:  I personally performed the services described in the documentation, reviewed and edited the documentation which was dictated to the scribe in my presence, and it accurately records my words and actions.    Mallie Snooks, PA-C 12/03/17 12:12 AM    Marisa Cyphers, PA-C  12/05/17 1158

## 2017-12-03 NOTE — ED Notes (Addendum)
Report given to Ebbie RidgeAlan       Keeon Zurn Chally, RN  12/03/17 [phone]

## 2017-12-03 NOTE — ED Notes (Signed)
Pt c/o elevated HR. Pt was at dialysis and stated his HR was over 120. Pt was told if his HR goes over 120 that he needs to be seen in the ER. Pt has dialysis MWF.      Saintclair HalstedEmily Chally, RN  12/03/17 2308

## 2017-12-03 NOTE — ED Triage Notes (Signed)
Pt states he was at dialysis earlier and he had a fast heart rate. Pt states he was told to come in and have his heart checked if his HR was above 120. Pt states hes on a heart medication "a little small one that starts with a d"

## 2017-12-04 ENCOUNTER — Inpatient Hospital Stay: Admit: 2017-12-04 | Discharge: 2017-12-04 | Disposition: A | Discharge status: Home / Self Care | Payer: MEDICARE

## 2017-12-04 LAB — CBC WITH AUTO DIFFERENTIAL
Basophils Absolute: 0 10*3/uL (ref 0.0–0.1)
Basophils: 0.5 %
Eosinophils Absolute: 0.1 10*3/uL (ref 0.0–0.4)
Eosinophils: 1 %
Hematocrit: 35.6 % — ABNORMAL LOW (ref 42.0–52.0)
Hemoglobin: 11.7 gm/dl — ABNORMAL LOW (ref 14.0–18.0)
Immature Grans (Abs): 0.03 10*3/uL (ref 0.00–0.07)
Immature Granulocytes %: 0.5 %
Lymphocytes Absolute: 1.3 10*3/uL (ref 1.0–4.8)
Lymphocytes: 21.1 %
MCH: 32.7 pg (ref 26.0–33.0)
MCHC: 32.9 gm/dl (ref 32.2–35.5)
MCV: 99.4 fL — ABNORMAL HIGH (ref 80.0–94.0)
MPV: 10.6 fL (ref 9.4–12.4)
Monocytes %: 7.8 %
Monocytes Absolute: 0.5 10*3/uL (ref 0.4–1.3)
Neutrophils Absolute: 4.4 10*3/uL (ref 1.8–7.7)
Platelets: 209 10*3/uL (ref 130–400)
RBC: 3.58 10*6/uL — ABNORMAL LOW (ref 4.70–6.10)
RDW-CV: 14.2 % (ref 11.5–14.5)
RDW-SD: 52.7 fL — ABNORMAL HIGH (ref 35.0–45.0)
Seg Neutrophils: 69.1 %
WBC: 6.3 10*3/uL (ref 4.8–10.8)
nRBC: 0 /100 wbc

## 2017-12-04 LAB — BASIC METABOLIC PANEL
BUN: 16 mg/dL (ref 7–22)
CO2: 25 meq/L (ref 23–33)
Calcium: 8.7 mg/dL (ref 8.5–10.5)
Chloride: 90 meq/L — ABNORMAL LOW (ref 98–111)
Creatinine: 4.7 mg/dL (ref 0.4–1.2)
Glucose: 150 mg/dL — ABNORMAL HIGH (ref 70–108)
Potassium: 3.3 meq/L — ABNORMAL LOW (ref 3.5–5.2)
Sodium: 134 meq/L — ABNORMAL LOW (ref 135–145)

## 2017-12-04 LAB — HEPATIC FUNCTION PANEL
ALT: 16 U/L (ref 11–66)
AST: 18 U/L (ref 5–40)
Albumin: 4.3 g/dL (ref 3.5–5.1)
Alkaline Phosphatase: 96 U/L (ref 38–126)
Bilirubin, Direct: <0.2 mg/dL (ref 0.0–0.3)
Total Bilirubin: 0.3 mg/dL (ref 0.3–1.2)
Total Protein: 8 g/dL (ref 6.1–8.0)

## 2017-12-04 LAB — EKG 12-LEAD
Atrial Rate: 116 {beats}/min
P Axis: 28 degrees
P-R Interval: 194 ms
Q-T Interval: 316 ms
QRS Duration: 80 ms
QTc Calculation (Bazett): 439 ms
R Axis: 28 degrees
T Axis: 42 degrees
Ventricular Rate: 116 {beats}/min

## 2017-12-04 LAB — RAPID INFLUENZA A/B ANTIGENS
Flu A Antigen: NEGATIVE
Flu B Antigen: NEGATIVE

## 2017-12-04 LAB — ANION GAP: Anion Gap: 19 meq/L — ABNORMAL HIGH (ref 8.0–16.0)

## 2017-12-04 LAB — GLOMERULAR FILTRATION RATE, ESTIMATED: Est, Glom Filt Rate: 15 mL/min/{1.73_m2} — AB

## 2017-12-04 LAB — OSMOLALITY: Osmolality Calc: 272.3 mOsmol/kg — ABNORMAL LOW (ref >=275.0)

## 2017-12-04 MED ORDER — SODIUM CHLORIDE 0.9 % IV BOLUS
0.9 % | Freq: Once | INTRAVENOUS | Status: AC
Start: 2017-12-04 — End: 2017-12-04
  Administered 2017-12-04: 03:00:00 250 mL via INTRAVENOUS

## 2017-12-09 LAB — CULTURE BLOOD #1: Blood Culture, Routine: NO GROWTH

## 2017-12-09 LAB — CULTURE, BLOOD 2: Blood Culture, Routine: NO GROWTH

## 2018-11-09 ENCOUNTER — Other Ambulatory Visit: Payer: Self-pay

## 2018-11-09 DIAGNOSIS — R200 Anesthesia of skin: Secondary | ICD-10-CM

## 2018-11-15 ENCOUNTER — Encounter: Payer: Self-pay | Admitting: Vascular Surgery

## 2018-11-15 ENCOUNTER — Encounter: Payer: Self-pay | Admitting: *Deleted

## 2018-11-15 ENCOUNTER — Ambulatory Visit (INDEPENDENT_AMBULATORY_CARE_PROVIDER_SITE_OTHER): Payer: Medicare Other | Admitting: Vascular Surgery

## 2018-11-15 ENCOUNTER — Ambulatory Visit (HOSPITAL_COMMUNITY)
Admission: RE | Admit: 2018-11-15 | Discharge: 2018-11-15 | Disposition: A | Discharge status: Home / Self Care | Payer: Medicare Other | Source: Ambulatory Visit | Attending: Vascular Surgery | Admitting: Vascular Surgery

## 2018-11-15 ENCOUNTER — Other Ambulatory Visit: Payer: Self-pay | Admitting: *Deleted

## 2018-11-15 ENCOUNTER — Other Ambulatory Visit: Payer: Self-pay

## 2018-11-15 VITALS — BP 90/55 | HR 84 | Temp 97.5°F | Resp 20 | Ht 74.0 in | Wt 222.7 lb

## 2018-11-15 DIAGNOSIS — Z992 Dependence on renal dialysis: Secondary | ICD-10-CM | POA: Diagnosis not present

## 2018-11-15 DIAGNOSIS — T82510A Breakdown (mechanical) of surgically created arteriovenous fistula, initial encounter: Secondary | ICD-10-CM | POA: Diagnosis not present

## 2018-11-15 DIAGNOSIS — R200 Anesthesia of skin: Secondary | ICD-10-CM | POA: Diagnosis not present

## 2018-11-15 DIAGNOSIS — N186 End stage renal disease: Secondary | ICD-10-CM

## 2018-11-15 HISTORY — DX: Breakdown (mechanical) of surgically created arteriovenous fistula, initial encounter: T82.510A

## 2018-11-15 NOTE — Progress Notes (Addendum)
Requested by:  Corliss Parish, MD [address] [place], [place] [address]  Reason for consultation: possible steal syndrome, ulceration of L arm fistula   History of Present Illness   Noah Vang is a 62 y.o. (February 26, 1956) male who presents for evaluation of permanent access.  He is about 4 years s/p L brachiocephalic fistula created by Dr. Maryjean Morn in [facility]  Over the past several months he has noticed mild to moderate numbness and left fifth finger during hemodialysis.  He reports this numbness to be tolerable however annoying.  He denies any pain or cold feeling in hand.  Also noted on exam today is a small ulcerated area of left arm fistula without drainage, sign of infection, or bleeding.  He states this was a area that was pink about 1 year ago however over the past couple months he has started to noticed a scab forming over this area.  He states they are avoiding this area when cannulating fistula.  He denies any episodes of bleeding from this area.  He is dialyzing on a Monday Wednesday Friday schedule.  He has not had any other permanent dialysis access other than current left arm AV fistula.  He does not take any blood thinners.  Past Medical History:  Diagnosis Date  . Diabetes mellitus without complication ([place])   . Dialysis patient ([place])   . Hypertension   . Renal disorder     Past Surgical History:  Procedure Laterality Date  . TEE WITHOUT CARDIOVERSION N/A 10/03/2014   Procedure: TRANSESOPHAGEAL ECHOCARDIOGRAM (TEE);  Surgeon: Thayer Headings, MD;  Location: [facility] ENDOSCOPY;  Service: Cardiovascular;  Laterality: N/A;    Social History   Socioeconomic History  . Marital status: Unknown    Spouse name: Not on file  . Number of children: Not on file  . Years of education: Not on file  . Highest education level: Not on file  Occupational History  . Not on file  Social Needs  . Financial resource strain: Not on file  . Food insecurity:    Worry: Not on file   Inability: Not on file  . Transportation needs:    Medical: Not on file    Non-medical: Not on file  Tobacco Use  . Smoking status: Never Smoker  . Smokeless tobacco: Never Used  Substance and Sexual Activity  . Alcohol use: No    Alcohol/week: 0.0 standard drinks  . Drug use: Not on file  . Sexual activity: Not on file  Lifestyle  . Physical activity:    Days per week: Not on file    Minutes per session: Not on file  . Stress: Not on file  Relationships  . Social connections:    Talks on phone: Not on file    Gets together: Not on file    Attends religious service: Not on file    Active member of club or organization: Not on file    Attends meetings of clubs or organizations: Not on file    Relationship status: Not on file  . Intimate partner violence:    Fear of current or ex partner: Not on file    Emotionally abused: Not on file    Physically abused: Not on file    Forced sexual activity: Not on file  Other Topics Concern  . Not on file  Social History Narrative  . Not on file   History reviewed. No pertinent family history.  Current Outpatient Medications  Medication Sig Dispense  Refill  . B Complex-C-Folic Acid (HM VITAMIN B COMPLEX/VITAMIN C) TABS Take by mouth.    . doxazosin (CARDURA) 4 MG tablet Take 4 mg by mouth at bedtime.    Marland Kitchen lanthanum (FOSRENOL) 750 MG chewable tablet Chew 750 mg by mouth 3 (three) times daily with meals.    Marland Kitchen aspirin EC 81 MG tablet Take 1 tablet (81 mg total) by mouth daily. (Patient not taking: Reported on 07/02/2017) 30 tablet 0  . carvedilol (COREG) 6.25 MG tablet Take 1 tablet (6.25 mg total) by mouth 2 (two) times daily with a meal. (Patient not taking: Reported on 07/02/2017) 60 tablet 1  . cloNIDine (CATAPRES) 0.1 MG tablet Take 1 tablet (0.1 mg total) by mouth 3 (three) times daily. (Patient not taking: Reported on 07/02/2017) 60 tablet 11  . gabapentin (NEURONTIN) 100 MG capsule Take 100 mg by mouth 3 (three) times daily.    .  insulin glargine (LANTUS) 100 UNIT/ML injection Inject 0.05 mLs (5 Units total) into the skin daily. (Patient not taking: Reported on 07/02/2017) 10 mL 11  . lisinopril (PRINIVIL,ZESTRIL) 40 MG tablet Take 40 mg by mouth daily.    . ondansetron (ZOFRAN ODT) 4 MG disintegrating tablet Take 1 tablet (4 mg total) by mouth every 8 (eight) hours as needed for nausea or vomiting. (Patient not taking: Reported on 11/15/2018) 20 tablet 0  . Vitamin D, Ergocalciferol, (DRISDOL) 50000 UNITS CAPS capsule Take 50,000 Units by mouth every 7 (seven) days. On Thursday     No current facility-administered medications for this visit.     Allergies  Allergen Reactions  . Vancomycin Rash    REVIEW OF SYSTEMS (negative unless checked):   Cardiac:  []  Chest pain or chest pressure? [x]  Shortness of breath upon activity? []  Shortness of breath when lying flat? []  Irregular heart rhythm?  Vascular:  []  Pain in calf, thigh, or hip brought on by walking? []  Pain in feet at night that wakes you up from your sleep? []  Blood clot in your veins? []  Leg swelling?  Pulmonary:  []  Oxygen at home? []  Productive cough? []  Wheezing?  Neurologic:  []  Sudden weakness in arms or legs? []  Sudden numbness in arms or legs? []  Sudden onset of difficult speaking or slurred speech? []  Temporary loss of vision in one eye? []  Problems with dizziness?  Gastrointestinal:  []  Blood in stool? []  Vomited blood?  Genitourinary:  []  Burning when urinating? []  Blood in urine?  Psychiatric:  []  Major depression  Hematologic:  []  Bleeding problems? []  Problems with blood clotting?  Dermatologic:  []  Rashes or ulcers?  Constitutional:  []  Fever or chills?  Ear/Nose/Throat:  []  Change in hearing? []  Nose bleeds? []  Sore throat?  Musculoskeletal:  []  Back pain? [x]  Joint pain? []  Muscle pain?   Physical Examination     Vitals:   11/15/18 1408  BP: (!) 90/55  Pulse: 84  Resp: 20  Temp: (!) 97.5 F  (36.4 C)  SpO2: 98%  Weight: 222 lb 10.6 oz (101 kg)  Height: 6\' 2"  (1.88 m)   Body mass index is 28.59 kg/m.  General Alert, O x 3, WD, NAD  Head [place]/AT,    Neck Supple, mid-line trachea,    Pulmonary Sym exp, good B air movt, CTA B  Cardiac RRR, Nl S1, S2,  Vascular Vessel Right Left  Radial Palpable Palpable  Brachial Palpable Palpable    Gastro- intestinal soft, non-distended, non-tender to palpation,   Musculo- skeletal M/S 5/5 throughout  ,  Extremities without ischemic changes  Palpable left radial pulse; palpable thrill and audible bruit throughout left arm fistula; thinning skin with central ulceration without drainage somewhat mobile when tenting skin  Neurologic Cranial nerves 2-12 intact , Mild numbnessMild sensory deficit left fifth finger  Psychiatric Judgement intact, Mood & affect appropriate for pt's clinical situation  Dermatologic See M/S exam for extremity exam, No rashes otherwise noted  Lymphatic  Palpable lymph nodes: None     Non-invasive Vascular Imaging    Steal study with no change or signal augmentation with compression of fistula   Medical Decision Making   Noah Vang is a 62 y.o. male who presents with end stage renal disease   Palpable L radial pulse; no signal augmentation with compression of fistula indicating a negative steal study; also given that fistula was created 3 to 4 years ago it is unlikely that the patient has developed a steal  More notable is the ulcerated area overlying the brachiocephalic fistula  Plan will be to plicate this area and potentially place a tunneled dialysis catheter for use during hemodialysis until surgical site heals  This will be scheduled with Dr. Carlis Abbott on 11/24/2018 Risk, benefits, and alternatives to access surgery were discussed.   The patient is aware the risks include but are not limited to: bleeding, infection, nerve damage, thrombosis, and need for additional procedures.   The patient agrees  to proceed forward with the procedure.   Dagoberto Ligas PA-C [facility] Office: [phone]  11/15/2018, 2:39 PM   I have seen and evaluated the patient. I agree with the PA note as documented above. Left brachiocephalic fistula created in [facility] about 3-4 years ago. Noticed some numbness in left 5th finger on dialysis last couple of months.  Seems tolerable.  Steal study negative.  Has thinning skin segment over aneurysmal fistula with great thrill.  Offered plication of fistula with TDC placement.  Patient in agreement.    Marty Heck, MD [facility] Office: [phone] Pager: [phone]

## 2018-11-21 ENCOUNTER — Encounter (HOSPITAL_COMMUNITY): Payer: Self-pay | Admitting: *Deleted

## 2018-11-21 ENCOUNTER — Other Ambulatory Visit: Payer: Self-pay

## 2018-11-21 NOTE — Progress Notes (Signed)
Spoke with pt for pre-op call. Pt denies cardiac history, states he has a fast heart rate at times, but states no one has ever figured what it was or why he was having it. Pt is no longer treated for HTN, he states it runs very low especially on dialysis. Pt is no longer on diabetic medicines. He states he does not check his blood sugar at home but states his girlfriend does have a meter and he can check it. I did instruct him to check his blood sugar Thursday AM when he gets up. If blood sugar is 70 or below, treat with 1/2 cup of clear juice (apple or cranberry) and recheck blood sugar 15 minutes after drinking juice. Pt voiced understanding.

## 2018-11-24 ENCOUNTER — Telehealth: Payer: Self-pay | Admitting: Vascular Surgery

## 2018-11-24 ENCOUNTER — Encounter (HOSPITAL_COMMUNITY): Payer: Self-pay | Admitting: Anesthesiology

## 2018-11-24 ENCOUNTER — Ambulatory Visit (HOSPITAL_COMMUNITY): Payer: Medicare Other | Admitting: Anesthesiology

## 2018-11-24 ENCOUNTER — Encounter (HOSPITAL_COMMUNITY)
Admission: RE | Disposition: A | Discharge status: Home / Self Care | Payer: Self-pay | Source: Home / Self Care | Attending: Vascular Surgery

## 2018-11-24 ENCOUNTER — Ambulatory Visit (HOSPITAL_COMMUNITY)
Admission: RE | Admit: 2018-11-24 | Discharge: 2018-11-24 | Disposition: A | Discharge status: Home / Self Care | Payer: Medicare Other | Attending: Vascular Surgery | Admitting: Vascular Surgery

## 2018-11-24 DIAGNOSIS — Z992 Dependence on renal dialysis: Secondary | ICD-10-CM | POA: Diagnosis not present

## 2018-11-24 DIAGNOSIS — Z794 Long term (current) use of insulin: Secondary | ICD-10-CM | POA: Diagnosis not present

## 2018-11-24 DIAGNOSIS — Z7982 Long term (current) use of aspirin: Secondary | ICD-10-CM | POA: Insufficient documentation

## 2018-11-24 DIAGNOSIS — Z79899 Other long term (current) drug therapy: Secondary | ICD-10-CM | POA: Diagnosis not present

## 2018-11-24 DIAGNOSIS — X58XXXA Exposure to other specified factors, initial encounter: Secondary | ICD-10-CM | POA: Diagnosis not present

## 2018-11-24 DIAGNOSIS — N189 Chronic kidney disease, unspecified: Secondary | ICD-10-CM | POA: Diagnosis not present

## 2018-11-24 DIAGNOSIS — I129 Hypertensive chronic kidney disease with stage 1 through stage 4 chronic kidney disease, or unspecified chronic kidney disease: Secondary | ICD-10-CM | POA: Diagnosis not present

## 2018-11-24 DIAGNOSIS — T82898A Other specified complication of vascular prosthetic devices, implants and grafts, initial encounter: Secondary | ICD-10-CM | POA: Insufficient documentation

## 2018-11-24 DIAGNOSIS — E1122 Type 2 diabetes mellitus with diabetic chronic kidney disease: Secondary | ICD-10-CM | POA: Diagnosis not present

## 2018-11-24 HISTORY — PX: FISTULA SUPERFICIALIZATION: SHX6341

## 2018-11-24 HISTORY — DX: Anemia, unspecified: D64.9

## 2018-11-24 HISTORY — DX: Pneumonia, unspecified organism: J18.9

## 2018-11-24 LAB — POCT I-STAT 4, (NA,K, GLUC, HGB,HCT)
Glucose, Bld: 166 mg/dL — ABNORMAL HIGH (ref 70–99)
HCT: 31 % — ABNORMAL LOW (ref 39.0–52.0)
Hemoglobin: 10.5 g/dL — ABNORMAL LOW (ref 13.0–17.0)
Potassium: 4.3 mmol/L (ref 3.5–5.1)
Sodium: 134 mmol/L — ABNORMAL LOW (ref 135–145)

## 2018-11-24 LAB — GLUCOSE, CAPILLARY
Glucose-Capillary: 161 mg/dL — ABNORMAL HIGH (ref 70–99)
Glucose-Capillary: 105 mg/dL — ABNORMAL HIGH (ref 70–99)

## 2018-11-24 SURGERY — FISTULA SUPERFICIALIZATION
Anesthesia: Monitor Anesthesia Care | Site: Arm Upper | Laterality: Left

## 2018-11-24 MED ORDER — PROPOFOL 500 MG/50ML IV EMUL
INTRAVENOUS | Status: DC | PRN
  Administered 2018-11-24: 50 ug/kg/min via INTRAVENOUS

## 2018-11-24 MED ORDER — HEPARIN SODIUM (PORCINE) 1000 UNIT/ML IJ SOLN
INTRAMUSCULAR | Status: AC
  Filled 2018-11-24: qty 1

## 2018-11-24 MED ORDER — METOCLOPRAMIDE HCL 5 MG/ML IJ SOLN: 10.0000 mg | Freq: Once | INTRAMUSCULAR | Status: DC | PRN

## 2018-11-24 MED ORDER — DEXMEDETOMIDINE HCL IN NACL 200 MCG/50ML IV SOLN
INTRAVENOUS | Status: DC | PRN
  Administered 2018-11-24: 8 ug via INTRAVENOUS

## 2018-11-24 MED ORDER — SODIUM CHLORIDE 0.9 % IV SOLN
INTRAVENOUS | Status: DC | PRN
  Administered 2018-11-24: 09:00:00

## 2018-11-24 MED ORDER — LIDOCAINE-EPINEPHRINE (PF) 1 %-1:200000 IJ SOLN
INTRAMUSCULAR | Status: DC | PRN
  Administered 2018-11-24: 7 mL

## 2018-11-24 MED ORDER — HEPARIN SODIUM (PORCINE) 1000 UNIT/ML IJ SOLN
INTRAMUSCULAR | Status: DC | PRN
  Administered 2018-11-24: 3000 [IU] via INTRAVENOUS

## 2018-11-24 MED ORDER — SODIUM CHLORIDE 0.9 % IV SOLN
INTRAVENOUS | Status: DC
  Administered 2018-11-24: 08:00:00 via INTRAVENOUS

## 2018-11-24 MED ORDER — MIDAZOLAM HCL 5 MG/5ML IJ SOLN
INTRAMUSCULAR | Status: DC | PRN
  Administered 2018-11-24 (×2): 1 mg via INTRAVENOUS

## 2018-11-24 MED ORDER — PROPOFOL 10 MG/ML IV BOLUS
INTRAVENOUS | Status: AC
  Filled 2018-11-24: qty 20

## 2018-11-24 MED ORDER — PROPOFOL 1000 MG/100ML IV EMUL
INTRAVENOUS | Status: AC
  Filled 2018-11-24: qty 200

## 2018-11-24 MED ORDER — SODIUM CHLORIDE 0.9 % IV SOLN
INTRAVENOUS | Status: DC | PRN
  Administered 2018-11-24: 25 ug/min via INTRAVENOUS

## 2018-11-24 MED ORDER — FENTANYL CITRATE (PF) 100 MCG/2ML IJ SOLN
INTRAMUSCULAR | Status: AC
  Filled 2018-11-24: qty 2

## 2018-11-24 MED ORDER — PHENYLEPHRINE 40 MCG/ML (10ML) SYRINGE FOR IV PUSH (FOR BLOOD PRESSURE SUPPORT)
PREFILLED_SYRINGE | INTRAVENOUS | Status: AC
  Filled 2018-11-24: qty 10

## 2018-11-24 MED ORDER — HYDROCODONE-ACETAMINOPHEN 5-325 MG PO TABS: 1.0000 | ORAL_TABLET | Freq: Four times a day (QID) | ORAL | 0 refills | Status: DC | PRN

## 2018-11-24 MED ORDER — LIDOCAINE 2% (20 MG/ML) 5 ML SYRINGE
INTRAMUSCULAR | Status: AC
  Filled 2018-11-24: qty 5

## 2018-11-24 MED ORDER — PROTAMINE SULFATE 10 MG/ML IV SOLN
INTRAVENOUS | Status: DC | PRN
  Administered 2018-11-24 (×2): 5 mg via INTRAVENOUS

## 2018-11-24 MED ORDER — LIDOCAINE-EPINEPHRINE (PF) 1 %-1:200000 IJ SOLN
INTRAMUSCULAR | Status: AC
  Filled 2018-11-24: qty 30

## 2018-11-24 MED ORDER — FENTANYL CITRATE (PF) 100 MCG/2ML IJ SOLN
INTRAMUSCULAR | Status: DC | PRN
  Administered 2018-11-24 (×2): 50 ug via INTRAVENOUS

## 2018-11-24 MED ORDER — ONDANSETRON HCL 4 MG/2ML IJ SOLN
INTRAMUSCULAR | Status: DC | PRN
  Administered 2018-11-24: 4 mg via INTRAVENOUS

## 2018-11-24 MED ORDER — LACTATED RINGERS IV SOLN: INTRAVENOUS | Status: DC

## 2018-11-24 MED ORDER — MIDAZOLAM HCL 2 MG/2ML IJ SOLN
INTRAMUSCULAR | Status: AC
  Filled 2018-11-24: qty 2

## 2018-11-24 MED ORDER — ONDANSETRON HCL 4 MG/2ML IJ SOLN
INTRAMUSCULAR | Status: AC
  Filled 2018-11-24: qty 2

## 2018-11-24 MED ORDER — EPHEDRINE 5 MG/ML INJ
INTRAVENOUS | Status: AC
  Filled 2018-11-24: qty 10

## 2018-11-24 MED ORDER — SODIUM CHLORIDE 0.9 % IV SOLN
INTRAVENOUS | Status: AC
  Filled 2018-11-24: qty 1.2

## 2018-11-24 MED ORDER — LIDOCAINE 2% (20 MG/ML) 5 ML SYRINGE
INTRAMUSCULAR | Status: DC | PRN
  Administered 2018-11-24: 40 mg via INTRAVENOUS

## 2018-11-24 MED ORDER — 0.9 % SODIUM CHLORIDE (POUR BTL) OPTIME
TOPICAL | Status: DC | PRN
  Administered 2018-11-24: 1000 mL

## 2018-11-24 MED ORDER — FENTANYL CITRATE (PF) 100 MCG/2ML IJ SOLN: 25.0000 ug | INTRAMUSCULAR | Status: DC | PRN

## 2018-11-24 MED ORDER — MEPERIDINE HCL 50 MG/ML IJ SOLN: 6.2500 mg | INTRAMUSCULAR | Status: DC | PRN

## 2018-11-24 MED ORDER — CEFAZOLIN SODIUM-DEXTROSE 2-4 GM/100ML-% IV SOLN
2.0000 g | INTRAVENOUS | Status: AC
  Administered 2018-11-24: 2 g via INTRAVENOUS
  Filled 2018-11-24: qty 100

## 2018-11-24 MED ORDER — DEXMEDETOMIDINE HCL IN NACL 200 MCG/50ML IV SOLN
INTRAVENOUS | Status: AC
  Filled 2018-11-24: qty 50

## 2018-11-24 MED ORDER — CHLORHEXIDINE GLUCONATE CLOTH 2 % EX PADS: 6.0000 | MEDICATED_PAD | Freq: Once | CUTANEOUS | Status: DC

## 2018-11-24 MED ORDER — HYDROCODONE-ACETAMINOPHEN 5-325 MG PO TABS
1.0000 | ORAL_TABLET | Freq: Once | ORAL | Status: AC | PRN
  Administered 2018-11-24: 1 via ORAL

## 2018-11-24 MED ORDER — HYDROCODONE-ACETAMINOPHEN 5-325 MG PO TABS
ORAL_TABLET | ORAL | Status: AC
  Filled 2018-11-24: qty 1

## 2018-11-24 MED ORDER — PROTAMINE SULFATE 10 MG/ML IV SOLN
INTRAVENOUS | Status: AC
  Filled 2018-11-24: qty 5

## 2018-11-24 SURGICAL SUPPLY — 36 items
ADH SKN CLS APL DERMABOND .7 (GAUZE/BANDAGES/DRESSINGS) ×1
AGENT HMST SPONGE THK3/8 (HEMOSTASIS)
ARMBAND PINK RESTRICT EXTREMIT (MISCELLANEOUS) ×3 IMPLANT
CANISTER SUCT 3000ML PPV (MISCELLANEOUS) ×3 IMPLANT
CLIP VESOCCLUDE MED 6/CT (CLIP) ×3 IMPLANT
CLIP VESOCCLUDE SM WIDE 6/CT (CLIP) ×3 IMPLANT
COVER PROBE W GEL 5X96 (DRAPES) IMPLANT
COVER WAND RF STERILE (DRAPES) ×3 IMPLANT
DECANTER SPIKE VIAL GLASS SM (MISCELLANEOUS) ×3 IMPLANT
DERMABOND ADVANCED (GAUZE/BANDAGES/DRESSINGS) ×2
DERMABOND ADVANCED .7 DNX12 (GAUZE/BANDAGES/DRESSINGS) ×1 IMPLANT
DRSG TEGADERM 4X4.75 (GAUZE/BANDAGES/DRESSINGS) ×2 IMPLANT
ELECT REM PT RETURN 9FT ADLT (ELECTROSURGICAL) ×3
ELECTRODE REM PT RTRN 9FT ADLT (ELECTROSURGICAL) ×1 IMPLANT
GLOVE BIO SURGEON STRL SZ7.5 (GLOVE) ×3 IMPLANT
GLOVE BIOGEL PI IND STRL 8 (GLOVE) ×1 IMPLANT
GLOVE BIOGEL PI INDICATOR 8 (GLOVE) ×2
GOWN STRL REUS W/ TWL LRG LVL3 (GOWN DISPOSABLE) ×2 IMPLANT
GOWN STRL REUS W/ TWL XL LVL3 (GOWN DISPOSABLE) ×2 IMPLANT
GOWN STRL REUS W/TWL LRG LVL3 (GOWN DISPOSABLE) ×6
GOWN STRL REUS W/TWL XL LVL3 (GOWN DISPOSABLE) ×6
HEMOSTAT SPONGE AVITENE ULTRA (HEMOSTASIS) IMPLANT
KIT BASIN OR (CUSTOM PROCEDURE TRAY) ×3 IMPLANT
KIT TURNOVER KIT B (KITS) ×3 IMPLANT
NS IRRIG 1000ML POUR BTL (IV SOLUTION) ×3 IMPLANT
PACK CV ACCESS (CUSTOM PROCEDURE TRAY) ×3 IMPLANT
PAD ARMBOARD 7.5X6 YLW CONV (MISCELLANEOUS) ×6 IMPLANT
SUT MNCRL AB 4-0 PS2 18 (SUTURE) ×3 IMPLANT
SUT PROLENE 5 0 C 1 24 (SUTURE) ×4 IMPLANT
SUT PROLENE 6 0 BV (SUTURE) ×2 IMPLANT
SUT PROLENE 7 0 BV 1 (SUTURE) ×3 IMPLANT
SUT VIC AB 3-0 SH 27 (SUTURE) ×6
SUT VIC AB 3-0 SH 27X BRD (SUTURE) ×2 IMPLANT
TOWEL GREEN STERILE (TOWEL DISPOSABLE) ×3 IMPLANT
UNDERPAD 30X30 (UNDERPADS AND DIAPERS) ×3 IMPLANT
WATER STERILE IRR 1000ML POUR (IV SOLUTION) ×3 IMPLANT

## 2018-11-24 NOTE — Discharge Instructions (Signed)
° °  [facility] ° °Discharge Instructions ° °AV Fistula or Graft Surgery for Dialysis Access ° °Please refer to the following instructions for your post-procedure care. Your surgeon or physician assistant will discuss any changes with you. ° °Activity ° °You may drive the day following your surgery, if you are comfortable and no longer taking prescription pain medication. Resume full activity as the soreness in your incision resolves. ° °Bathing/Showering ° °You may shower after you go home. Keep your incision dry for 48 hours. Do not soak in a bathtub, hot tub, or swim until the incision heals completely. You may not shower if you have a hemodialysis catheter. ° °Incision Care ° °Clean your incision with mild soap and water after 48 hours. Pat the area dry with a clean towel. You do not need a bandage unless otherwise instructed. Do not apply any ointments or creams to your incision. You may have skin glue on your incision. Do not peel it off. It will come off on its own in about one week. Your arm may swell a bit after surgery. To reduce swelling use pillows to elevate your arm so it is above your heart. Your doctor will tell you if you need to lightly wrap your arm with an ACE bandage. ° °Diet ° °Resume your normal diet. There are not special food restrictions following this procedure. In order to heal from your surgery, it is CRITICAL to get adequate nutrition. Your body requires vitamins, minerals, and protein. Vegetables are the best source of vitamins and minerals. Vegetables also provide the perfect balance of protein. Processed food has little nutritional value, so try to avoid this. ° °Medications ° °Resume taking all of your medications. If your incision is causing pain, you may take over-the counter pain relievers such as acetaminophen (Tylenol). If you were prescribed a stronger pain medication, please be aware these medications can cause nausea and constipation. Prevent  nausea by taking the medication with a snack or meal. Avoid constipation by drinking plenty of fluids and eating foods with high amount of fiber, such as fruits, vegetables, and grains. Do not take Tylenol if you are taking prescription pain medications. ° ° ° ° °Follow up °Your surgeon may want to see you in the office following your access surgery. If so, this will be arranged at the time of your surgery. ° °Please call us immediately for any of the following conditions: ° °Increased pain, redness, drainage (pus) from your incision site °Fever of 101 degrees or higher °Severe or worsening pain at your incision site °Hand pain or numbness. ° °Reduce your risk of vascular disease: ° °Stop smoking. If you would like help, call [facility] at 1-800-QUIT-NOW (1-800-784-8669) or [place] at 336-586-4000 ° °Manage your cholesterol °Maintain a desired weight °Control your diabetes °Keep your blood pressure down ° °Dialysis ° °It will take several weeks to several months for your new dialysis access to be ready for use. Your surgeon will determine when it is OK to use it. Your nephrologist will continue to direct your dialysis. You can continue to use your Permcath until your new access is ready for use. ° °If you have any questions, please call the office at [phone]. ° °

## 2018-11-24 NOTE — H&P (Signed)
History and Physical Interval Note:  11/24/2018 8:29 AM  Noah Vang  has presented today for surgery, with the diagnosis of COMPLICATION WITH ARTERIOVENOUS FISTULA LEFT ARM  The various methods of treatment have been discussed with the patient and family. After consideration of risks, benefits and other options for treatment, the patient has consented to  Procedure(s): PLICATION ARTERIOVENOUS FISTULA LEFT ARM (Left) as a surgical intervention .  The patient's history has been reviewed, patient examined, no change in status, stable for surgery.  I have reviewed the patient's chart and labs.  Questions were answered to the patient's satisfaction.    Left arm avf revision for aneurysm/ulceration and possible Alex  Requested by:  Corliss Parish, MD [address] [place], [place] [address]  Reason for consultation: possible steal syndrome, ulceration of L arm fistula   History of Present Illness   Noah Vang is a 62 y.o. ([birth year]) male who presents for evaluation of permanent access.  He is about 4 years s/p L brachiocephalic fistula created by Dr. Maryjean Morn in [facility].  Over the past several months he has noticed mild to moderate numbness and left fifth finger during hemodialysis.  He reports this numbness to be tolerable however annoying.  He denies any pain or cold feeling in hand.  Also noted on exam today is a small ulcerated area of left arm fistula without drainage, sign of infection, or bleeding.  He states this was a area that was pink about 1 year ago however over the past couple months he has started to noticed a scab forming over this area.  He states they are avoiding this area when cannulating fistula.  He denies any episodes of bleeding from this area.  He is dialyzing on a Monday Wednesday Friday schedule.  He has not had any other permanent dialysis access other than current left arm AV fistula.  He does not take any blood thinners.      Past  Medical History:  Diagnosis Date  . Diabetes mellitus without complication ([place])   . Dialysis patient ([place])   . Hypertension   . Renal disorder          Past Surgical History:  Procedure Laterality Date  . TEE WITHOUT CARDIOVERSION N/A 10/03/2014   Procedure: TRANSESOPHAGEAL ECHOCARDIOGRAM (TEE);  Surgeon: Thayer Headings, MD;  Location: [facility] ENDOSCOPY;  Service: Cardiovascular;  Laterality: N/A;    Social History        Socioeconomic History  . Marital status: Unknown    Spouse name: Not on file  . Number of children: Not on file  . Years of education: Not on file  . Highest education level: Not on file  Occupational History  . Not on file  Social Needs  . Financial resource strain: Not on file  . Food insecurity:    Worry: Not on file    Inability: Not on file  . Transportation needs:    Medical: Not on file    Non-medical: Not on file  Tobacco Use  . Smoking status: Never Smoker  . Smokeless tobacco: Never Used  Substance and Sexual Activity  . Alcohol use: No    Alcohol/week: 0.0 standard drinks  . Drug use: Not on file  . Sexual activity: Not on file  Lifestyle  . Physical activity:    Days per week: Not on file    Minutes per session: Not on file  . Stress: Not on file  Relationships  . Social  connections:    Talks on phone: Not on file    Gets together: Not on file    Attends religious service: Not on file    Active member of club or organization: Not on file    Attends meetings of clubs or organizations: Not on file    Relationship status: Not on file  . Intimate partner violence:    Fear of current or ex partner: Not on file    Emotionally abused: Not on file    Physically abused: Not on file    Forced sexual activity: Not on file  Other Topics Concern  . Not on file  Social History Narrative  . Not on file   History reviewed. No pertinent family history.        Current Outpatient Medications   Medication Sig Dispense Refill  . B Complex-C-Folic Acid (HM VITAMIN B COMPLEX/VITAMIN C) TABS Take by mouth.    . doxazosin (CARDURA) 4 MG tablet Take 4 mg by mouth at bedtime.    Marland Kitchen lanthanum (FOSRENOL) 750 MG chewable tablet Chew 750 mg by mouth 3 (three) times daily with meals.    Marland Kitchen aspirin EC 81 MG tablet Take 1 tablet (81 mg total) by mouth daily. (Patient not taking: Reported on 07/02/2017) 30 tablet 0  . carvedilol (COREG) 6.25 MG tablet Take 1 tablet (6.25 mg total) by mouth 2 (two) times daily with a meal. (Patient not taking: Reported on 07/02/2017) 60 tablet 1  . cloNIDine (CATAPRES) 0.1 MG tablet Take 1 tablet (0.1 mg total) by mouth 3 (three) times daily. (Patient not taking: Reported on 07/02/2017) 60 tablet 11  . gabapentin (NEURONTIN) 100 MG capsule Take 100 mg by mouth 3 (three) times daily.    . insulin glargine (LANTUS) 100 UNIT/ML injection Inject 0.05 mLs (5 Units total) into the skin daily. (Patient not taking: Reported on 07/02/2017) 10 mL 11  . lisinopril (PRINIVIL,ZESTRIL) 40 MG tablet Take 40 mg by mouth daily.    . ondansetron (ZOFRAN ODT) 4 MG disintegrating tablet Take 1 tablet (4 mg total) by mouth every 8 (eight) hours as needed for nausea or vomiting. (Patient not taking: Reported on 11/15/2018) 20 tablet 0  . Vitamin D, Ergocalciferol, (DRISDOL) 50000 UNITS CAPS capsule Take 50,000 Units by mouth every 7 (seven) days. On Thursday     No current facility-administered medications for this visit.         Allergies  Allergen Reactions  . Vancomycin Rash    REVIEW OF SYSTEMS (negative unless checked):   Cardiac:  [] ? Chest pain or chest pressure? [x] ? Shortness of breath upon activity? [] ? Shortness of breath when lying flat? [] ? Irregular heart rhythm?  Vascular:  [] ? Pain in calf, thigh, or hip brought on by walking? [] ? Pain in feet at night that wakes you up from your sleep? [] ? Blood clot in your veins? [] ? Leg swelling?  Pulmonary:   [] ? Oxygen at home? [] ? Productive cough? [] ? Wheezing?  Neurologic:  [] ? Sudden weakness in arms or legs? [] ? Sudden numbness in arms or legs? [] ? Sudden onset of difficult speaking or slurred speech? [] ? Temporary loss of vision in one eye? [] ? Problems with dizziness?  Gastrointestinal:  [] ? Blood in stool? [] ? Vomited blood?  Genitourinary:  [] ? Burning when urinating? [] ? Blood in urine?  Psychiatric:  [] ? Major depression  Hematologic:  [] ? Bleeding problems? [] ? Problems with blood clotting?  Dermatologic:  [] ? Rashes or ulcers?  Constitutional:  [] ? Fever or chills?  Ear/Nose/Throat:  [] ? Change in hearing? [] ? Nose bleeds? [] ? Sore throat?  Musculoskeletal:  [] ? Back pain? [x] ? Joint pain? [] ? Muscle pain?   Physical Examination        Vitals:   11/15/18 1408  BP: (!) 90/55  Pulse: 84  Resp: 20  Temp: (!) 97.5 F (36.4 C)  SpO2: 98%  Weight: 222 lb 10.6 oz (101 kg)  Height: 6\' 2"  (1.88 m)   Body mass index is 28.59 kg/m.  General Alert, O x 3, WD, NAD  Head [place]/AT,    Neck Supple, mid-line trachea,    Pulmonary Sym exp, good B air movt, CTA B  Cardiac RRR, Nl S1, S2,  Vascular Vessel Right Left  Radial Palpable Palpable  Brachial Palpable Palpable    Gastro- intestinal soft, non-distended, non-tender to palpation,   Musculo- skeletal M/S 5/5 throughout  , Extremities without ischemic changes  Palpable left radial pulse; palpable thrill and audible bruit throughout left arm fistula; thinning skin with central ulceration without drainage somewhat mobile when tenting skin  Neurologic Cranial nerves 2-12 intact , Mild numbnessMild sensory deficit left fifth finger  Psychiatric Judgement intact, Mood & affect appropriate for pt's clinical situation  Dermatologic See M/S exam for extremity exam, No rashes otherwise noted  Lymphatic  Palpable lymph nodes: None     Non-invasive Vascular Imaging   ? Steal study  with no change or signal augmentation with compression of fistula   Medical Decision Making   Noah Vang is a 62 y.o. male who presents with end stage renal disease   Palpable L radial pulse; no signal augmentation with compression of fistula indicating a negative steal study; also given that fistula was created 3 to 4 years ago it is unlikely that the patient has developed a steal  More notable is the ulcerated area overlying the brachiocephalic fistula  Plan will be to plicate this area and potentially place a tunneled dialysis catheter for use during hemodialysis until surgical site heals  This will be scheduled with Dr. Carlis Abbott on 11/24/2018  Risk, benefits, and alternatives to access surgery were discussed.    The patient is aware the risks include but are not limited to: bleeding, infection, nerve damage, thrombosis, and need for additional procedures.    The patient agrees to proceed forward with the procedure.   Dagoberto Ligas PA-C [facility] Office: [phone]  11/15/2018, 2:39 PM   I have seen and evaluated the patient. I agree with the PA note as documented above. Left brachiocephalic fistula created in [facility] about 3-4 years ago. Noticed some numbness in left 5th finger on dialysis last couple of months.  Seems tolerable.  Steal study negative.  Has thinning skin segment over aneurysmal fistula with great thrill.  Offered plication of fistula with TDC placement.  Patient in agreement.    Marty Heck, MD [facility] [place] Office: [phone] Pager: [phone]

## 2018-11-24 NOTE — Transfer of Care (Signed)
Immediate Anesthesia Transfer of Care Note  Patient: Noah Vang  Procedure(s) Performed: PLICATION ARTERIOVENOUS FISTULA LEFT UPPER ARM (Left Arm Upper)  Patient Location: PACU  Anesthesia Type:MAC  Level of Consciousness: awake, alert  and patient cooperative  Airway & Oxygen Therapy: Patient Spontanous Breathing and Patient connected to nasal cannula oxygen  Post-op Assessment: Report given to RN and Post -op Vital signs reviewed and stable  Post vital signs: Reviewed and stable  Last Vitals:  Vitals Value Taken Time  BP 99/69 11/24/2018 10:09 AM  Temp    Pulse 98 11/24/2018 10:09 AM  Resp 19 11/24/2018 10:09 AM  SpO2 99 % 11/24/2018 10:09 AM  Vitals shown include unvalidated device data.  Last Pain:  Vitals:   11/24/18 0722  TempSrc:   PainSc: 0-No pain      Patients Stated Pain Goal: 5 (18/59/09 3112)  Complications: No apparent anesthesia complications

## 2018-11-24 NOTE — Op Note (Signed)
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 Marty Heck, MD  Assisstant: Arlee Muslim, PA  Indications: Patient is a 62 year old male who recently presented to clinic for evaluation of possible steal syndrome of a left upper extremity brachiocephalic fistula.  Ultimately his steal study was negative and it appeared that his symptoms were tolerable.  He was noted to have a large aneurysm of his fistula in the ulcerated segment.  We subsequently recommended proceeding to the OR for revision after risks and benefits were discussed.  Findings: The largest aneurysm was plicated and the overlying ulcerated skin was resected.  He appeared to have fistula above and below our surgical site for access in the future and as a result did not place a tunneled dialysis catheter.  He does have two other aneurysms that are smaller and given that the overlying skin has good integrity we did not repair these at this time.  Details: The patient was taken to the operating room after informed consent was obtained.  He was placed on operative table in supine position.  His left arm was prepped and draped in usual sterile fashion.  We made a longitudinal incision over the largest aneurysmal segment of his fistula where the skin was ulcerated.  Initially dissected down proximally with Bovie cautery and blunt dissection and got a vessel loop around the fistula for proximal control.  We then extended our dissection up toward the axilla until the aneurysm was completely mobilized circumferentially.  At that point time the patient was given 3000 units of IV heparin.  The fistula was then clamped proximally and distally to the aneurysm.  I then open the aneurysm with an 11 blade scalpel and the wall of the  aneurysm was resected for plication with Metzenbaum scissors.  I then sewed the wall of the of the fistula back together with a running 5-0 Prolene.  There were several areas had to be repaired with 6-0 Prolene in interrupted fashion.  The wound was then copiously irrigated with saline until the effluent was clear.  I then ran a subcutaneous layer with 3-0 Vicryl.  The skin was closed with 4-0 Monocryl.  Dermabond was applied.  Patient had a good thrill.  Marty Heck, MD [facility] [place] Office: [phone] Pager: [phone]

## 2018-11-24 NOTE — Telephone Encounter (Signed)
sch appt spk to pt lvm mdl ltr 12/20/2018 330pm p/o MD

## 2018-11-24 NOTE — Progress Notes (Signed)
Pt HR 104, BP 154/89. Pt states he hasn't taken any medicine today. Pt denies being on carvedilol. Pt states he does take one pill for his heart rate at night time and he took this last night. Pt unable to say which pill this was. EKG was done today. ST 103. Dr. Marcell Barlow notified

## 2018-11-24 NOTE — Anesthesia Postprocedure Evaluation (Signed)
Anesthesia Post Note  Patient: Noah Vang  Procedure(s) Performed: PLICATION ARTERIOVENOUS FISTULA LEFT UPPER ARM (Left Arm Upper)     Patient location during evaluation: PACU Anesthesia Type: MAC Level of consciousness: awake and alert Pain management: pain level controlled Vital Signs Assessment: post-procedure vital signs reviewed and stable Respiratory status: spontaneous breathing, nonlabored ventilation, respiratory function stable and patient connected to nasal cannula oxygen Cardiovascular status: stable and blood pressure returned to baseline Postop Assessment: no apparent nausea or vomiting Anesthetic complications: no    Last Vitals:  Vitals:   11/24/18 1030 11/24/18 1045  BP:  116/67  Pulse: 96 95  Resp: 17 18  Temp:    SpO2: 98% 97%    Last Pain:  Vitals:   11/24/18 1030  TempSrc:   PainSc: 5                  Montez Hageman

## 2018-11-24 NOTE — Anesthesia Preprocedure Evaluation (Signed)
Anesthesia Evaluation  Patient identified by MRN, date of birth, ID band Patient awake    Reviewed: Allergy & Precautions, NPO status , Patient's Chart, lab work & pertinent test results  Airway Mallampati: II  TM Distance: >3 FB Neck ROM: Full    Dental no notable dental hx.    Pulmonary neg pulmonary ROS,    Pulmonary exam normal breath sounds clear to auscultation       Cardiovascular (-) hypertensionNormal cardiovascular exam Rhythm:Regular Rate:Normal     Neuro/Psych negative neurological ROS  negative psych ROS   GI/Hepatic negative GI ROS, Neg liver ROS,   Endo/Other  diabetes  Renal/GU Dialysis and ESRFRenal disease  negative genitourinary   Musculoskeletal negative musculoskeletal ROS (+)   Abdominal   Peds negative pediatric ROS (+)  Hematology negative hematology ROS (+)   Anesthesia Other Findings   Reproductive/Obstetrics negative OB ROS                             Anesthesia Physical Anesthesia Plan  ASA: III  Anesthesia Plan: MAC   Post-op Pain Management:    Induction: Intravenous  PONV Risk Score and Plan: 1 and Treatment may vary due to age or medical condition  Airway Management Planned: Simple Face Mask  Additional Equipment:   Intra-op Plan:   Post-operative Plan:   Informed Consent: I have reviewed the patients History and Physical, chart, labs and discussed the procedure including the risks, benefits and alternatives for the proposed anesthesia with the patient or authorized representative who has indicated his/her understanding and acceptance.   Dental advisory given  Plan Discussed with: CRNA  Anesthesia Plan Comments:         Anesthesia Quick Evaluation

## 2018-11-24 NOTE — Telephone Encounter (Signed)
-----   Message from Dagoberto Ligas, PA-C sent at 11/24/2018 10:06 AM EST -----  Can you schedule an appt for this pt in about 4 weeks with Dr. Carlis Abbott.  PO L arm fistula plication. Thanks, [facility]

## 2018-11-24 NOTE — Anesthesia Procedure Notes (Signed)
Procedure Name: MAC Date/Time: 11/24/2018 8:56 AM Performed by: Renato Shin, CRNA Pre-anesthesia Checklist: Patient identified, Emergency Drugs available, Suction available and Patient being monitored Patient Re-evaluated:Patient Re-evaluated prior to induction Oxygen Delivery Method: Nasal cannula Preoxygenation: Pre-oxygenation with 100% oxygen Induction Type: IV induction Placement Confirmation: positive ETCO2 and breath sounds checked- equal and bilateral Dental Injury: Teeth and Oropharynx as per pre-operative assessment

## 2018-11-25 ENCOUNTER — Encounter (HOSPITAL_COMMUNITY): Payer: Self-pay | Admitting: Vascular Surgery

## 2018-12-20 ENCOUNTER — Other Ambulatory Visit: Payer: Self-pay

## 2018-12-20 ENCOUNTER — Encounter: Payer: Self-pay | Admitting: Vascular Surgery

## 2018-12-20 ENCOUNTER — Ambulatory Visit (INDEPENDENT_AMBULATORY_CARE_PROVIDER_SITE_OTHER): Payer: Self-pay | Admitting: Vascular Surgery

## 2018-12-20 VITALS — BP 97/61 | HR 100 | Temp 97.2°F | Resp 20 | Ht 74.0 in | Wt 225.1 lb

## 2018-12-20 DIAGNOSIS — Z992 Dependence on renal dialysis: Secondary | ICD-10-CM

## 2018-12-20 DIAGNOSIS — N186 End stage renal disease: Secondary | ICD-10-CM

## 2018-12-20 NOTE — Progress Notes (Signed)
Patient name: Noah Vang MRN: [id number] DOB: 1956-03-06 Sex: male  REASON FOR VISIT: Postop check status post left arm AV fistula revision  HPI: Noah Vang is a 63 y.o. male with end-stage renal disease that presents for postop check after left upper extremity AV fistula plication/revision.  Patient had an ulcerated segment over his brachiocephalic fistula and we subsequently plicated this aneurysmal segment on 11/24/2018.  We did not place a tunneled dialysis catheter since we thought he would be able to use the fistula.  On follow-up today reports that he has done very well and the incision is healed without issue.  He still has a little bit of numbness in his left pinky finger but previous steal study showed no evidence of steal.  Past Medical History:  Diagnosis Date  . Anemia   . Diabetes mellitus without complication (Noah Vang)    no longer taking meds  . Dialysis patient (Noah Vang)   . Hypertension    no longer on meds, BP now low on dialysis  . Pneumonia   . Renal disorder     Past Surgical History:  Procedure Laterality Date  . CHOLECYSTECTOMY    . COLONOSCOPY    . DIALYSIS FISTULA CREATION    . FISTULA SUPERFICIALIZATION Left 34/19/3790   Procedure: PLICATION ARTERIOVENOUS FISTULA LEFT UPPER ARM;  Surgeon: Noah Heck, MD;  Location: [place];  Service: Vascular;  Laterality: Left;  . TEE WITHOUT CARDIOVERSION N/A 10/03/2014   Procedure: TRANSESOPHAGEAL ECHOCARDIOGRAM (TEE);  Surgeon: Noah Headings, MD;  Location: [place];  Service: Cardiovascular;  Laterality: N/A;    History reviewed. No pertinent family history.  SOCIAL HISTORY: Social History   Tobacco Use  . Smoking status: Never Smoker  . Smokeless tobacco: Never Used  Substance Use Topics  . Alcohol use: Yes    Alcohol/week: 21.0 standard drinks    Types: 21 Shots of liquor per week    Allergies  Allergen Reactions  . Benadryl [Diphenhydramine]     hallucinations  . Vancomycin Rash   hallucinations    Current Outpatient Medications  Medication Sig Dispense Refill  . lanthanum (FOSRENOL) 750 MG chewable tablet Chew 750 mg by mouth 3 (three) times daily with meals.    . sevelamer carbonate (RENVELA) 800 MG tablet Take 800 mg by mouth 3 (three) times daily with meals.     No current facility-administered medications for this visit.     REVIEW OF SYSTEMS:  [X]  denotes positive finding, [ ]  denotes negative finding Cardiac  Comments:  Chest pain or chest pressure:    Shortness of breath upon exertion:    Short of breath when lying flat:    Irregular heart rhythm:        Vascular    Pain in calf, thigh, or hip brought on by ambulation:    Pain in feet at night that wakes you up from your sleep:     Blood clot in your veins:    Leg swelling:         Pulmonary    Oxygen at home:    Productive cough:     Wheezing:         Neurologic    Sudden weakness in arms or legs:     Sudden numbness in arms or legs:     Sudden onset of difficulty speaking or slurred speech:    Temporary loss of vision in one eye:     Problems with dizziness:  Gastrointestinal    Blood in stool:     Vomited blood:         Genitourinary    Burning when urinating:     Blood in urine:        Psychiatric    Major depression:         Hematologic    Bleeding problems:    Problems with blood clotting too easily:        Skin    Rashes or ulcers:        Constitutional    Fever or chills:      PHYSICAL EXAM: Vitals:   12/20/18 1524  BP: 97/61  Pulse: 100  Resp: 20  Temp: (!) 97.2 F (36.2 C)  SpO2: 98%  Weight: 225 lb 1.4 oz (102.1 kg)  Height: 6\' 2"  (1.88 m)    GENERAL: The patient is a well-nourished male, in no acute distress. The vital signs are documented above. CARDIAC: There is a regular rate and rhythm.  VASCULAR:  Left arm brachiocephalic AVF with good thrill, well healed incision, one aneurysm near anastomosis and one aneurysm in upper arm - skin has  integrity for now PULMONARY: There is good air exchange bilaterally without wheezing or rales.  DATA:   None  Assessment/Plan:  Overall Mr. Ridener is doing very well after plication of his left brachiocephalic fistula at the most ulcerated segment.  He does have two other aneurysms one down by the arterial anastomosis with healthy overlying skin and a second smaller aneurysm on the upper arm that we will continue to observe for now.  Certainly the ulcerated segment that we intervened upon was the most severe segment and this has healed without issue.  They are using his fistula at this time by sticking away from the area that we revised.  I told him to give it a few more weeks to heal and they can try accessing the revised segment.  He would like to see me back in 3 months just to keep a close eye on these other aneurysmal segments.   Noah Heck, MD [facility] [place] Office: [phone] Pager: [phone]

## 2019-03-28 ENCOUNTER — Other Ambulatory Visit: Payer: Self-pay | Admitting: *Deleted

## 2019-03-28 ENCOUNTER — Other Ambulatory Visit: Payer: Self-pay

## 2019-03-28 ENCOUNTER — Ambulatory Visit: Payer: Medicare Other | Admitting: Vascular Surgery

## 2019-03-28 ENCOUNTER — Encounter: Payer: Self-pay | Admitting: Vascular Surgery

## 2019-03-28 ENCOUNTER — Ambulatory Visit (INDEPENDENT_AMBULATORY_CARE_PROVIDER_SITE_OTHER): Payer: Medicare Other | Admitting: Vascular Surgery

## 2019-03-28 ENCOUNTER — Encounter: Payer: Self-pay | Admitting: *Deleted

## 2019-03-28 VITALS — BP 150/89 | HR 129 | Temp 97.1°F | Resp 18 | Ht 74.0 in | Wt 218.3 lb

## 2019-03-28 DIAGNOSIS — N186 End stage renal disease: Secondary | ICD-10-CM | POA: Diagnosis not present

## 2019-03-28 DIAGNOSIS — Z992 Dependence on renal dialysis: Secondary | ICD-10-CM | POA: Diagnosis not present

## 2019-03-28 NOTE — Progress Notes (Signed)
Patient name: Noah Vang MRN: [id number] DOB: 1956/05/21 Sex: male  REASON FOR VISIT: Left arm swelling  HPI: Noah Vang is a 63 y.o. male with history of end-stage renal disease and diabetes that presents for evaluation of new left arm swelling that started this weekend.  Patient is well-known to the vascular surgery service and has a left brachiocephalic fistula and underwent aneurysm plication for ulcerated segment on 11/24/2018 by myself.  He states the fistula worked very well for the last 4 months.  About 1 to 2 weeks ago he noticed left hand swelling and then over the weekend has had progressive left arm swelling to involve the entire arm.  He last dialyzed yesterday and states the fistula worked ok.  Dialysis center was concerned about his access and subsequently sent him here for evaluation.  States he has had a right-sided chest catheter but that is now removed.  No previous left sided chest access.  Past Medical History:  Diagnosis Date  . Anemia   . Diabetes mellitus without complication ([place])    no longer taking meds  . Dialysis patient ([place])   . Hypertension    no longer on meds, BP now low on dialysis  . Pneumonia   . Renal disorder     Past Surgical History:  Procedure Laterality Date  . CHOLECYSTECTOMY    . COLONOSCOPY    . DIALYSIS FISTULA CREATION    . FISTULA SUPERFICIALIZATION Left 25/03/3975   Procedure: PLICATION ARTERIOVENOUS FISTULA LEFT UPPER ARM;  Surgeon: Marty Heck, MD;  Location: [place];  Service: Vascular;  Laterality: Left;  . TEE WITHOUT CARDIOVERSION N/A 10/03/2014   Procedure: TRANSESOPHAGEAL ECHOCARDIOGRAM (TEE);  Surgeon: Thayer Headings, MD;  Location: [facility] ENDOSCOPY;  Service: Cardiovascular;  Laterality: N/A;    Family History  Problem Relation Age of Onset  . Diabetes Mother   . Diabetes Father   . Cancer Father     SOCIAL HISTORY: Social History   Tobacco Use  . Smoking status: Never Smoker  . Smokeless tobacco:  Never Used  Substance Use Topics  . Alcohol use: Yes    Alcohol/week: 21.0 standard drinks    Types: 21 Shots of liquor per week    Allergies  Allergen Reactions  . Benadryl [Diphenhydramine]     hallucinations  . Vancomycin Rash    hallucinations    Current Outpatient Medications  Medication Sig Dispense Refill  . lanthanum (FOSRENOL) 750 MG chewable tablet Chew 750 mg by mouth 3 (three) times daily with meals.    . sevelamer carbonate (RENVELA) 800 MG tablet Take 800 mg by mouth 3 (three) times daily with meals.     No current facility-administered medications for this visit.     REVIEW OF SYSTEMS:  [X]  denotes positive finding, [ ]  denotes negative finding Cardiac  Comments:  Chest pain or chest pressure:    Shortness of breath upon exertion:    Short of breath when lying flat:    Irregular heart rhythm:        Vascular    Pain in calf, thigh, or hip brought on by ambulation:    Pain in feet at night that wakes you up from your sleep:     Blood clot in your veins:    Leg swelling:         Pulmonary    Oxygen at home:    Productive cough:     Wheezing:  Neurologic    Sudden weakness in arms or legs:     Sudden numbness in arms or legs:     Sudden onset of difficulty speaking or slurred speech:    Temporary loss of vision in one eye:     Problems with dizziness:         Gastrointestinal    Blood in stool:     Vomited blood:         Genitourinary    Burning when urinating:     Blood in urine:        Psychiatric    Major depression:         Hematologic    Bleeding problems:    Problems with blood clotting too easily:        Skin    Rashes or ulcers:        Constitutional    Fever or chills:      PHYSICAL EXAM: Vitals:   03/28/19 1259  BP: (!) 150/89  Pulse: (!) 129  Resp: 18  Temp: (!) 97.1 F (36.2 C)  TempSrc: Oral  SpO2: 100%  Weight: 218 lb 4.1 oz (99 kg)  Height: 6\' 2"  (1.88 m)    GENERAL: The patient is a well-nourished  male, in no acute distress. The vital signs are documented above. CARDIAC: There is a regular rate and rhythm.  VASCULAR:  Left arm with marked edema of hand to axilla Left radial pulse palpable - 1+ with amount of edema Left brachiocephalic AVF - pulsatile character down by antecubitum, thrill in upper arm PULMONARY: There is good air exchange bilaterally without wheezing or rales. ABDOMEN: Soft and non-tender with normal pitched bowel sounds.  MUSCULOSKELETAL: There are no major deformities or cyanosis. NEUROLOGIC: No focal weakness or paresthesias are detected.   DATA:   None  Assessment/Plan:  63 year old male that presents with marketed left upper extremity swelling from his hand all the way up to his axilla.  I recommended left upper extremity fistulogram on Thursday which is his next nondialysis day.  The fistula does have a pulsatile character in distal arm concerning for stenosis, but thrill in upper arm more proximal.  I discussed in detail risk and benefits of the procedure.  Hopefully this will be amendable to endovascular therapy, but if not and his arm swelling remains this extensive also discussed possibility of requiring fistula ligation and evaluate for new access.    Marty Heck, MD [facility] Office: [phone] Pager: [phone]

## 2019-03-30 ENCOUNTER — Other Ambulatory Visit: Payer: Self-pay | Admitting: *Deleted

## 2019-03-30 ENCOUNTER — Encounter (HOSPITAL_COMMUNITY)
Admission: RE | Disposition: A | Discharge status: Home / Self Care | Payer: Self-pay | Source: Home / Self Care | Attending: Vascular Surgery

## 2019-03-30 ENCOUNTER — Encounter (HOSPITAL_COMMUNITY): Payer: Self-pay | Admitting: Vascular Surgery

## 2019-03-30 ENCOUNTER — Ambulatory Visit (HOSPITAL_COMMUNITY)
Admission: RE | Admit: 2019-03-30 | Discharge: 2019-03-30 | Disposition: A | Discharge status: Home / Self Care | Payer: Medicare Other | Attending: Vascular Surgery | Admitting: Vascular Surgery

## 2019-03-30 ENCOUNTER — Other Ambulatory Visit: Payer: Self-pay

## 2019-03-30 DIAGNOSIS — M7989 Other specified soft tissue disorders: Secondary | ICD-10-CM | POA: Diagnosis not present

## 2019-03-30 DIAGNOSIS — Z992 Dependence on renal dialysis: Secondary | ICD-10-CM | POA: Diagnosis not present

## 2019-03-30 DIAGNOSIS — T82858A Stenosis of vascular prosthetic devices, implants and grafts, initial encounter: Secondary | ICD-10-CM | POA: Diagnosis not present

## 2019-03-30 DIAGNOSIS — Z79899 Other long term (current) drug therapy: Secondary | ICD-10-CM | POA: Diagnosis not present

## 2019-03-30 DIAGNOSIS — Y832 Surgical operation with anastomosis, bypass or graft as the cause of abnormal reaction of the patient, or of later complication, without mention of misadventure at the time of the procedure: Secondary | ICD-10-CM | POA: Insufficient documentation

## 2019-03-30 DIAGNOSIS — Z888 Allergy status to other drugs, medicaments and biological substances status: Secondary | ICD-10-CM | POA: Diagnosis not present

## 2019-03-30 DIAGNOSIS — I120 Hypertensive chronic kidney disease with stage 5 chronic kidney disease or end stage renal disease: Secondary | ICD-10-CM | POA: Insufficient documentation

## 2019-03-30 DIAGNOSIS — Z833 Family history of diabetes mellitus: Secondary | ICD-10-CM | POA: Diagnosis not present

## 2019-03-30 DIAGNOSIS — T82898A Other specified complication of vascular prosthetic devices, implants and grafts, initial encounter: Secondary | ICD-10-CM

## 2019-03-30 DIAGNOSIS — E1122 Type 2 diabetes mellitus with diabetic chronic kidney disease: Secondary | ICD-10-CM | POA: Insufficient documentation

## 2019-03-30 DIAGNOSIS — N186 End stage renal disease: Secondary | ICD-10-CM | POA: Insufficient documentation

## 2019-03-30 DIAGNOSIS — Z881 Allergy status to other antibiotic agents status: Secondary | ICD-10-CM | POA: Insufficient documentation

## 2019-03-30 HISTORY — PX: A/V FISTULAGRAM: CATH118298

## 2019-03-30 LAB — POCT I-STAT 4, (NA,K, GLUC, HGB,HCT)
Glucose, Bld: 84 mg/dL (ref 70–99)
HCT: 25 % — ABNORMAL LOW (ref 39.0–52.0)
Hemoglobin: 8.5 g/dL — ABNORMAL LOW (ref 13.0–17.0)
Potassium: 3.4 mmol/L — ABNORMAL LOW (ref 3.5–5.1)
Sodium: 131 mmol/L — ABNORMAL LOW (ref 135–145)

## 2019-03-30 LAB — GLUCOSE, CAPILLARY
Glucose-Capillary: 82 mg/dL (ref 70–99)
Glucose-Capillary: 81 mg/dL (ref 70–99)

## 2019-03-30 SURGERY — A/V FISTULAGRAM
Anesthesia: LOCAL

## 2019-03-30 MED ORDER — SODIUM CHLORIDE 0.9 % IV SOLN: 250.0000 mL | INTRAVENOUS | Status: DC | PRN

## 2019-03-30 MED ORDER — SODIUM CHLORIDE 0.9% FLUSH: 3.0000 mL | Freq: Two times a day (BID) | INTRAVENOUS | Status: DC

## 2019-03-30 MED ORDER — IODIXANOL 320 MG/ML IV SOLN
INTRAVENOUS | Status: DC | PRN
  Administered 2019-03-30: 70 mL via INTRAVENOUS

## 2019-03-30 MED ORDER — HEPARIN (PORCINE) IN NACL 1000-0.9 UT/500ML-% IV SOLN
INTRAVENOUS | Status: AC
  Filled 2019-03-30: qty 500

## 2019-03-30 MED ORDER — HEPARIN (PORCINE) IN NACL 1000-0.9 UT/500ML-% IV SOLN
INTRAVENOUS | Status: DC | PRN
  Administered 2019-03-30: 500 mL

## 2019-03-30 MED ORDER — SODIUM CHLORIDE 0.9% FLUSH: 3.0000 mL | INTRAVENOUS | Status: DC | PRN

## 2019-03-30 MED ORDER — LIDOCAINE HCL (PF) 1 % IJ SOLN
INTRAMUSCULAR | Status: AC
  Filled 2019-03-30: qty 30

## 2019-03-30 MED ORDER — LIDOCAINE HCL (PF) 1 % IJ SOLN
INTRAMUSCULAR | Status: DC | PRN
  Administered 2019-03-30: 5 mL

## 2019-03-30 SURGICAL SUPPLY — 15 items
BAG SNAP BAND KOVER 36X36 (MISCELLANEOUS) ×2 IMPLANT
CATH ANGIO 5F BER2 100CM (CATHETERS) ×1 IMPLANT
CATH ANGIO 5F BER2 65CM (CATHETERS) ×2 IMPLANT
COVER DOME SNAP 22 D (MISCELLANEOUS) ×3 IMPLANT
DEVICE TORQUE .025-.038 (MISCELLANEOUS) ×1 IMPLANT
GLIDEWIRE ANGLED SS 035X260CM (WIRE) ×1 IMPLANT
KIT MICROPUNCTURE NIT STIFF (SHEATH) ×1 IMPLANT
PROTECTION STATION PRESSURIZED (MISCELLANEOUS) ×2
SHEATH PINNACLE R/O II 7F 4CM (SHEATH) ×1 IMPLANT
SHEATH PROBE COVER 6X72 (BAG) ×3 IMPLANT
STATION PROTECTION PRESSURIZED (MISCELLANEOUS) ×1 IMPLANT
STOPCOCK MORSE 400PSI 3WAY (MISCELLANEOUS) ×2 IMPLANT
TRAY PV CATH (CUSTOM PROCEDURE TRAY) ×2 IMPLANT
TUBING CIL FLEX 10 FLL-RA (TUBING) ×2 IMPLANT
WIRE BENTSON .035X145CM (WIRE) ×1 IMPLANT

## 2019-03-30 NOTE — Op Note (Signed)
    Patient name: Noah Vang MRN: [id number] DOB: 1955-12-23 Sex: male  03/30/2019 Pre-operative Diagnosis: End-stage renal disease, left arm swelling Post-operative diagnosis:  Same Surgeon:  Eda Paschal. Donzetta Matters, MD Procedure Performed: 1.  Ultrasound-guided cannulation left arm AV fistula 2.  Left upper extremity fistulogram 3.  Right upper extremity venogram  Indications: 63 year old male with history of end-stage renal disease on dialysis via left arm fistula.  He has 2-week history of significant left arm swelling.  He is indicated for fistulogram possible invention.  Findings: There is a tight stenosis in the mid arm in the cephalic vein proximally 80 to 90%.  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  On the right side there appears to be patent central venous structures.  Patient will be scheduled for right IJ catheter with right upper extremity access and ligation of his left arm AV fistula in the near future.   Procedure:  The patient was identified in the holding area and taken to the [place] lab where he was placed upon operative table.  He was then prepped draped left lower extremity usual fashion timeout was called.  We use ultrasound to identify the fistula and cannulated this with direct ultrasound visualization with micropuncture needle followed by wire and sheath.  Images saved the prior record.  Fistulogram demonstrated the above findings.  We placed a 7 French sheath used bare catheter Glidewire to attempt to cross the occluded innominate vein but could not.  Given that there are multiple level stenoses with innominate occlusion I elected that he will need to have his fistula ligated.  We performed right upper extremity venogram which demonstrated patent central structures.  Cephalic vein does not appear amenable for access possibly as basilic vein.  We will plan catheter as well as right upper extremity access and ligating his left arm fistula in the near future.  He  tolerated this procedure without immediate complication.  Contrast: 70cc   Julieanne Hadsall C. Donzetta Matters, MD [facility] [place] Office: [phone] Pager: [phone]

## 2019-03-30 NOTE — Discharge Instructions (Signed)

## 2019-03-30 NOTE — Progress Notes (Signed)
Phone call to patient. Instructed to be at [facility] admitting department at 7 am on 04/04/2019 for procedure. NPO past MN night prior. Must have a driver and caregiver for discharge. Reviewed visitor restrictions. Expect a call and follow the detailed surgery/medication instructions received from the hospital pre-admission department. Verbalized understanding. To call this office if questions.

## 2019-03-30 NOTE — Progress Notes (Signed)
Dr Donzetta Matters was paged, HGB 8.5. spoke with surgical nurse Lanelle Bal RN and she said she would inform him, no orders followed.

## 2019-03-30 NOTE — H&P (Signed)
H+P    History of Present Illness: This is a 63 y.o. male with esrd.  He has previous revision of this fistula.  Has 2 weeks of hand swelling and now few days of upper extremity swelling throughout.  He is sent here for fistulogram.  Past Medical History:  Diagnosis Date  . Anemia   . Diabetes mellitus without complication ([place])    no longer taking meds  . Dialysis patient ([place])   . Hypertension    no longer on meds, BP now low on dialysis  . Pneumonia   . Renal disorder     Past Surgical History:  Procedure Laterality Date  . CHOLECYSTECTOMY    . COLONOSCOPY    . DIALYSIS FISTULA CREATION    . FISTULA SUPERFICIALIZATION Left 34/74/2595   Procedure: PLICATION ARTERIOVENOUS FISTULA LEFT UPPER ARM;  Surgeon: Marty Heck, MD;  Location: [place];  Service: Vascular;  Laterality: Left;  . TEE WITHOUT CARDIOVERSION N/A 10/03/2014   Procedure: TRANSESOPHAGEAL ECHOCARDIOGRAM (TEE);  Surgeon: Thayer Headings, MD;  Location: [place];  Service: Cardiovascular;  Laterality: N/A;    Allergies  Allergen Reactions  . Benadryl [Diphenhydramine]     hallucinations  . Vancomycin Rash    hallucinations    Prior to Admission medications   Medication Sig Start Date End Date Taking? Authorizing Provider  acetaminophen (TYLENOL) 500 MG tablet Take 500 mg by mouth every 6 (six) hours as needed for mild pain.   Yes [provider]  metoprolol succinate (TOPROL-XL) 25 MG 24 hr tablet Take 25 mg by mouth at bedtime.  01/02/19  Yes [provider]  sevelamer carbonate (RENVELA) 800 MG tablet Take 800 mg by mouth 3 (three) times daily with meals.   Yes [provider]    Social History   Socioeconomic History  . Marital status: Unknown    Spouse name: Not on file  . Number of children: Not on file  . Years of education: Not on file  . Highest education level: Not on file  Occupational History  . Not on file  Social Needs  . Financial resource strain:  Not on file  . Food insecurity:    Worry: Not on file    Inability: Not on file  . Transportation needs:    Medical: Not on file    Non-medical: Not on file  Tobacco Use  . Smoking status: Never Smoker  . Smokeless tobacco: Never Used  Substance and Sexual Activity  . Alcohol use: Yes    Alcohol/week: 21.0 standard drinks    Types: 21 Shots of liquor per week  . Drug use: Not Currently  . Sexual activity: Not on file  Lifestyle  . Physical activity:    Days per week: Not on file    Minutes per session: Not on file  . Stress: Not on file  Relationships  . Social connections:    Talks on phone: Not on file    Gets together: Not on file    Attends religious service: Not on file    Active member of club or organization: Not on file    Attends meetings of clubs or organizations: Not on file    Relationship status: Not on file  . Intimate partner violence:    Fear of current or ex partner: Not on file    Emotionally abused: Not on file    Physically abused: Not on file    Forced sexual activity: Not on file  Other Topics  Concern  . Not on file  Social History Narrative  . Not on file     Family History  Problem Relation Age of Onset  . Diabetes Mother   . Diabetes Father   . Cancer Father     ROS: left arm swelling  Physical Examination  Vitals:   03/30/19 0830  BP: (!) 165/98  Pulse: 90  Resp: 20  Temp: 98.2 F (36.8 C)  SpO2: 100%   Body mass index is 28.02 kg/m.  General: No acute distress HENT: WNL, normocephalic Pulmonary: Nonlabored respirations Extremities: Left upper extremity nonpitting edema Musculoskeletal: no muscle wasting or atrophy  Neurologic: A&O X 3; Appropriate Affect ; SENSATION: normal; MOTOR FUNCTION:  moving all extremities equally. Speech is fluent/normal   CBC    Component Value Date/Time   WBC 4.3 07/02/2017 1545   RBC 3.38 (L) 07/02/2017 1545   HGB 8.5 (L) 03/30/2019 0911   HCT 25.0 (L) 03/30/2019 0911   PLT 104 (L)  07/02/2017 1545   MCV 97.9 07/02/2017 1545   MCH 32.2 07/02/2017 1545   MCHC 32.9 07/02/2017 1545   RDW 14.8 07/02/2017 1545   LYMPHSABS 0.8 09/28/2014 1817   MONOABS 1.5 (H) 09/28/2014 1817   EOSABS 0.0 09/28/2014 1817   BASOSABS 0.0 09/28/2014 1817    BMET    Component Value Date/Time   NA 131 (L) 03/30/2019 0911   K 3.4 (L) 03/30/2019 0911   CL 92 (L) 07/02/2017 1545   CO2 26 07/02/2017 1545   GLUCOSE 84 03/30/2019 0911   BUN 35 (H) 07/02/2017 1545   CREATININE 9.10 (H) 07/02/2017 1545   CALCIUM 8.9 07/02/2017 1545   GFRNONAA 6 (L) 07/02/2017 1545   GFRAA 6 (L) 07/02/2017 1545    COAGS: Lab Results  Component Value Date   INR 1.18 10/02/2014      ASSESSMENT/PLAN: This is a 63 y.o. male end-stage renal disease on dialysis via left upper arm fistula.  He has swelling of his left arm for a few weeks.  We will plan fistulogram possible invention today.  Brandon C. Donzetta Matters, MD [facility] [place] Office: [phone] Pager: [phone]

## 2019-04-03 ENCOUNTER — Other Ambulatory Visit: Payer: Self-pay

## 2019-04-03 ENCOUNTER — Encounter (HOSPITAL_COMMUNITY): Payer: Self-pay | Admitting: *Deleted

## 2019-04-03 NOTE — Progress Notes (Signed)
   04/03/19 1722  OBSTRUCTIVE SLEEP APNEA  Have you ever been diagnosed with sleep apnea through a sleep study? No  Do you snore loudly (loud enough to be heard through closed doors)?  1  Do you often feel tired, fatigued, or sleepy during the daytime (such as falling asleep during driving or talking to someone)? 1  Has anyone observed you stop breathing during your sleep? 0  Do you have, or are you being treated for high blood pressure? 1  BMI more than 35 kg/m2? 0  Age > 50 (1-yes) 1  Neck circumference greater than:Male 16 inches or larger, Male 17inches or larger? 1  Male Gender (Yes=1) 1  Obstructive Sleep Apnea Score 6  Score 5 or greater  Results sent to PCP

## 2019-04-03 NOTE — Progress Notes (Signed)
Noah Vang denies chest pain or shortness of breath.  Patient has Type II diabetes, she does not take medications and does not have a CBG machine. Patient denies that he nor his family has experienced any of the following: Cough Fever >100.4 Runny Nose Sore Throat Difficulty breathing/ shortness of breath Travel in past 14 days- none.

## 2019-04-04 ENCOUNTER — Ambulatory Visit (HOSPITAL_COMMUNITY): Payer: Medicare Other

## 2019-04-04 ENCOUNTER — Ambulatory Visit (HOSPITAL_COMMUNITY)
Admission: RE | Admit: 2019-04-04 | Discharge: 2019-04-04 | Disposition: A | Discharge status: Home / Self Care | Payer: Medicare Other | Attending: Vascular Surgery | Admitting: Vascular Surgery

## 2019-04-04 ENCOUNTER — Ambulatory Visit (HOSPITAL_COMMUNITY): Payer: Medicare Other | Admitting: Certified Registered"

## 2019-04-04 ENCOUNTER — Encounter (HOSPITAL_COMMUNITY): Payer: Self-pay

## 2019-04-04 ENCOUNTER — Encounter (HOSPITAL_COMMUNITY)
Admission: RE | Disposition: A | Discharge status: Home / Self Care | Payer: Self-pay | Source: Home / Self Care | Attending: Vascular Surgery

## 2019-04-04 DIAGNOSIS — T82510A Breakdown (mechanical) of surgically created arteriovenous fistula, initial encounter: Secondary | ICD-10-CM | POA: Diagnosis present

## 2019-04-04 DIAGNOSIS — I120 Hypertensive chronic kidney disease with stage 5 chronic kidney disease or end stage renal disease: Secondary | ICD-10-CM | POA: Insufficient documentation

## 2019-04-04 DIAGNOSIS — Y841 Kidney dialysis as the cause of abnormal reaction of the patient, or of later complication, without mention of misadventure at the time of the procedure: Secondary | ICD-10-CM | POA: Insufficient documentation

## 2019-04-04 DIAGNOSIS — Z888 Allergy status to other drugs, medicaments and biological substances status: Secondary | ICD-10-CM | POA: Diagnosis not present

## 2019-04-04 DIAGNOSIS — Z992 Dependence on renal dialysis: Secondary | ICD-10-CM

## 2019-04-04 DIAGNOSIS — Z79899 Other long term (current) drug therapy: Secondary | ICD-10-CM | POA: Insufficient documentation

## 2019-04-04 DIAGNOSIS — Z419 Encounter for procedure for purposes other than remedying health state, unspecified: Secondary | ICD-10-CM

## 2019-04-04 DIAGNOSIS — N186 End stage renal disease: Secondary | ICD-10-CM

## 2019-04-04 DIAGNOSIS — E1122 Type 2 diabetes mellitus with diabetic chronic kidney disease: Secondary | ICD-10-CM | POA: Insufficient documentation

## 2019-04-04 DIAGNOSIS — T82898A Other specified complication of vascular prosthetic devices, implants and grafts, initial encounter: Secondary | ICD-10-CM | POA: Diagnosis not present

## 2019-04-04 DIAGNOSIS — Z95828 Presence of other vascular implants and grafts: Secondary | ICD-10-CM

## 2019-04-04 DIAGNOSIS — Z881 Allergy status to other antibiotic agents status: Secondary | ICD-10-CM | POA: Diagnosis not present

## 2019-04-04 HISTORY — PX: INSERTION OF DIALYSIS CATHETER: SHX1324

## 2019-04-04 HISTORY — DX: End stage renal disease: N18.6

## 2019-04-04 HISTORY — PX: AV FISTULA PLACEMENT: SHX1204

## 2019-04-04 HISTORY — PX: LIGATION OF ARTERIOVENOUS  FISTULA: SHX5948

## 2019-04-04 LAB — POCT I-STAT 4, (NA,K, GLUC, HGB,HCT)
Glucose, Bld: 94 mg/dL (ref 70–99)
HCT: 25 % — ABNORMAL LOW (ref 39.0–52.0)
Hemoglobin: 8.5 g/dL — ABNORMAL LOW (ref 13.0–17.0)
Potassium: 3.6 mmol/L (ref 3.5–5.1)
Sodium: 131 mmol/L — ABNORMAL LOW (ref 135–145)

## 2019-04-04 LAB — GLUCOSE, CAPILLARY
Glucose-Capillary: 93 mg/dL (ref 70–99)
Glucose-Capillary: 114 mg/dL — ABNORMAL HIGH (ref 70–99)

## 2019-04-04 LAB — SURGICAL PCR SCREEN
MRSA, PCR: NEGATIVE
Staphylococcus aureus: POSITIVE — AB

## 2019-04-04 SURGERY — INSERTION OF DIALYSIS CATHETER
Anesthesia: Monitor Anesthesia Care | Site: Neck | Laterality: Right

## 2019-04-04 MED ORDER — ACETAMINOPHEN 160 MG/5ML PO SOLN: 1000.0000 mg | Freq: Once | ORAL | Status: DC | PRN

## 2019-04-04 MED ORDER — 0.9 % SODIUM CHLORIDE (POUR BTL) OPTIME
TOPICAL | Status: DC | PRN
  Administered 2019-04-04: 1000 mL

## 2019-04-04 MED ORDER — DEXAMETHASONE SODIUM PHOSPHATE 10 MG/ML IJ SOLN
INTRAMUSCULAR | Status: DC | PRN
  Administered 2019-04-04: 5 mg via INTRAVENOUS

## 2019-04-04 MED ORDER — LIDOCAINE HCL (PF) 1 % IJ SOLN
INTRAMUSCULAR | Status: AC
  Filled 2019-04-04: qty 30

## 2019-04-04 MED ORDER — SODIUM CHLORIDE 0.9 % IV SOLN
INTRAVENOUS | Status: DC
  Administered 2019-04-04: 07:00:00 via INTRAVENOUS

## 2019-04-04 MED ORDER — OXYCODONE HCL 5 MG PO TABS
5.0000 mg | ORAL_TABLET | Freq: Once | ORAL | Status: AC | PRN
  Administered 2019-04-04: 5 mg via ORAL

## 2019-04-04 MED ORDER — FENTANYL CITRATE (PF) 250 MCG/5ML IJ SOLN
INTRAMUSCULAR | Status: DC | PRN
  Administered 2019-04-04 (×4): 25 ug via INTRAVENOUS

## 2019-04-04 MED ORDER — ONDANSETRON HCL 4 MG/2ML IJ SOLN
INTRAMUSCULAR | Status: AC
  Filled 2019-04-04: qty 2

## 2019-04-04 MED ORDER — PHENYLEPHRINE HCL (PRESSORS) 10 MG/ML IV SOLN
INTRAVENOUS | Status: AC
  Filled 2019-04-04: qty 1

## 2019-04-04 MED ORDER — ACETAMINOPHEN 500 MG PO TABS: 1000.0000 mg | ORAL_TABLET | Freq: Once | ORAL | Status: DC | PRN

## 2019-04-04 MED ORDER — LIDOCAINE 2% (20 MG/ML) 5 ML SYRINGE
INTRAMUSCULAR | Status: AC
  Filled 2019-04-04: qty 5

## 2019-04-04 MED ORDER — HEPARIN SODIUM (PORCINE) 1000 UNIT/ML IJ SOLN
INTRAMUSCULAR | Status: DC | PRN
  Administered 2019-04-04: 1.5 [IU] via INTRAVENOUS

## 2019-04-04 MED ORDER — HEPARIN SODIUM (PORCINE) 1000 UNIT/ML IJ SOLN
INTRAMUSCULAR | Status: AC
  Filled 2019-04-04: qty 1

## 2019-04-04 MED ORDER — SODIUM CHLORIDE 0.9 % IV SOLN
INTRAVENOUS | Status: DC | PRN
  Administered 2019-04-04: 500 mL

## 2019-04-04 MED ORDER — HEPARIN SODIUM (PORCINE) 1000 UNIT/ML IJ SOLN
1500.0000 [IU] | Freq: Once | INTRAMUSCULAR | Status: AC
  Administered 2019-04-04: 1500 [IU] via INTRAVENOUS

## 2019-04-04 MED ORDER — FENTANYL CITRATE (PF) 100 MCG/2ML IJ SOLN: 25.0000 ug | INTRAMUSCULAR | Status: DC | PRN

## 2019-04-04 MED ORDER — CEFAZOLIN SODIUM-DEXTROSE 2-4 GM/100ML-% IV SOLN
2.0000 g | INTRAVENOUS | Status: AC
  Administered 2019-04-04: 2 g via INTRAVENOUS
  Filled 2019-04-04: qty 100

## 2019-04-04 MED ORDER — CHLORHEXIDINE GLUCONATE 4 % EX LIQD: 60.0000 mL | Freq: Once | CUTANEOUS | Status: DC

## 2019-04-04 MED ORDER — ONDANSETRON HCL 4 MG/2ML IJ SOLN
INTRAMUSCULAR | Status: DC | PRN
  Administered 2019-04-04: 4 mg via INTRAVENOUS

## 2019-04-04 MED ORDER — LIDOCAINE-EPINEPHRINE (PF) 1 %-1:200000 IJ SOLN
INTRAMUSCULAR | Status: AC
  Filled 2019-04-04: qty 30

## 2019-04-04 MED ORDER — PROPOFOL 500 MG/50ML IV EMUL
INTRAVENOUS | Status: DC | PRN
  Administered 2019-04-04: 75 ug/kg/min via INTRAVENOUS

## 2019-04-04 MED ORDER — PROPOFOL 10 MG/ML IV BOLUS
INTRAVENOUS | Status: AC
  Filled 2019-04-04: qty 20

## 2019-04-04 MED ORDER — OXYCODONE HCL 5 MG PO TABS
ORAL_TABLET | ORAL | Status: AC
  Filled 2019-04-04: qty 1

## 2019-04-04 MED ORDER — MIDAZOLAM HCL 2 MG/2ML IJ SOLN
INTRAMUSCULAR | Status: DC | PRN
  Administered 2019-04-04: 2 mg via INTRAVENOUS

## 2019-04-04 MED ORDER — LIDOCAINE HCL (CARDIAC) PF 100 MG/5ML IV SOSY
PREFILLED_SYRINGE | INTRAVENOUS | Status: DC | PRN
  Administered 2019-04-04: 60 mg via INTRATRACHEAL

## 2019-04-04 MED ORDER — SODIUM CHLORIDE 0.9 % IV SOLN
INTRAVENOUS | Status: AC
  Filled 2019-04-04: qty 1.2

## 2019-04-04 MED ORDER — DEXAMETHASONE SODIUM PHOSPHATE 10 MG/ML IJ SOLN
INTRAMUSCULAR | Status: AC
  Filled 2019-04-04: qty 1

## 2019-04-04 MED ORDER — MIDAZOLAM HCL 2 MG/2ML IJ SOLN
INTRAMUSCULAR | Status: AC
  Filled 2019-04-04: qty 2

## 2019-04-04 MED ORDER — OXYCODONE HCL 5 MG PO TABS: 5.0000 mg | ORAL_TABLET | Freq: Four times a day (QID) | ORAL | 0 refills | Status: DC | PRN

## 2019-04-04 MED ORDER — LIDOCAINE-EPINEPHRINE (PF) 1 %-1:200000 IJ SOLN
INTRAMUSCULAR | Status: DC | PRN
  Administered 2019-04-04: 60 mL
  Administered 2019-04-04: 30 mL

## 2019-04-04 MED ORDER — OXYCODONE HCL 5 MG/5ML PO SOLN: 5.0000 mg | Freq: Once | ORAL | Status: AC | PRN

## 2019-04-04 MED ORDER — ACETAMINOPHEN 10 MG/ML IV SOLN: 1000.0000 mg | Freq: Once | INTRAVENOUS | Status: DC | PRN

## 2019-04-04 MED ORDER — HEPARIN SOD (PORK) LOCK FLUSH 100 UNIT/ML IV SOLN: 500.0000 [IU] | INTRAVENOUS | Status: DC | PRN

## 2019-04-04 MED ORDER — FENTANYL CITRATE (PF) 250 MCG/5ML IJ SOLN
INTRAMUSCULAR | Status: AC
  Filled 2019-04-04: qty 5

## 2019-04-04 MED ORDER — PROPOFOL 10 MG/ML IV BOLUS
INTRAVENOUS | Status: DC | PRN
  Administered 2019-04-04: 30 mg via INTRAVENOUS

## 2019-04-04 SURGICAL SUPPLY — 66 items
ADH SKN CLS APL DERMABOND .7 (GAUZE/BANDAGES/DRESSINGS) ×9
ARMBAND PINK RESTRICT EXTREMIT (MISCELLANEOUS) ×4 IMPLANT
BAG DECANTER FOR FLEXI CONT (MISCELLANEOUS) ×4 IMPLANT
BANDAGE ELASTIC 4 VELCRO ST LF (GAUZE/BANDAGES/DRESSINGS) ×1 IMPLANT
BANDAGE ELASTIC 6 VELCRO ST LF (GAUZE/BANDAGES/DRESSINGS) ×1 IMPLANT
BIOPATCH RED 1 DISK 7.0 (GAUZE/BANDAGES/DRESSINGS) ×4 IMPLANT
CANISTER SUCT 3000ML PPV (MISCELLANEOUS) ×4 IMPLANT
CATH PALINDROME RT-P 15FX19CM (CATHETERS) ×1 IMPLANT
CATH PALINDROME RT-P 15FX23CM (CATHETERS) IMPLANT
CATH PALINDROME RT-P 15FX28CM (CATHETERS) IMPLANT
CATH PALINDROME RT-P 15FX55CM (CATHETERS) IMPLANT
CLIP VESOCCLUDE MED 6/CT (CLIP) ×4 IMPLANT
CLIP VESOCCLUDE SM WIDE 6/CT (CLIP) ×4 IMPLANT
COVER PROBE W GEL 5X96 (DRAPES) ×5 IMPLANT
COVER SURGICAL LIGHT HANDLE (MISCELLANEOUS) ×4 IMPLANT
COVER WAND RF STERILE (DRAPES) ×4 IMPLANT
DERMABOND ADVANCED (GAUZE/BANDAGES/DRESSINGS) ×3
DERMABOND ADVANCED .7 DNX12 (GAUZE/BANDAGES/DRESSINGS) ×3 IMPLANT
DRAPE C-ARM 42X72 X-RAY (DRAPES) ×4 IMPLANT
DRAPE CHEST BREAST 15X10 FENES (DRAPES) ×4 IMPLANT
DRAPE HALF SHEET 40X57 (DRAPES) ×1 IMPLANT
DRAPE ORTHO SPLIT 77X108 STRL (DRAPES) ×4
DRAPE SURG ORHT 6 SPLT 77X108 (DRAPES) IMPLANT
ELECT REM PT RETURN 9FT ADLT (ELECTROSURGICAL) ×4
ELECTRODE REM PT RTRN 9FT ADLT (ELECTROSURGICAL) ×3 IMPLANT
GAUZE 4X4 16PLY RFD (DISPOSABLE) ×4 IMPLANT
GAUZE SPONGE 4X4 12PLY STRL LF (GAUZE/BANDAGES/DRESSINGS) ×1 IMPLANT
GLOVE BIO SURGEON STRL SZ7.5 (GLOVE) ×5 IMPLANT
GOWN STRL NON-REIN LRG LVL3 (GOWN DISPOSABLE) ×1 IMPLANT
GOWN STRL REUS W/ TWL LRG LVL3 (GOWN DISPOSABLE) ×6 IMPLANT
GOWN STRL REUS W/ TWL XL LVL3 (GOWN DISPOSABLE) ×3 IMPLANT
GOWN STRL REUS W/TWL LRG LVL3 (GOWN DISPOSABLE) ×12
GOWN STRL REUS W/TWL XL LVL3 (GOWN DISPOSABLE) ×8
INSERT FOGARTY SM (MISCELLANEOUS) ×1 IMPLANT
KIT BASIN OR (CUSTOM PROCEDURE TRAY) ×4 IMPLANT
KIT TURNOVER KIT B (KITS) ×4 IMPLANT
NDL 18GX1X1/2 (RX/OR ONLY) (NEEDLE) ×3 IMPLANT
NDL HYPO 25GX1X1/2 BEV (NEEDLE) ×3 IMPLANT
NEEDLE 18GX1X1/2 (RX/OR ONLY) (NEEDLE) ×4 IMPLANT
NEEDLE HYPO 25GX1X1/2 BEV (NEEDLE) ×4 IMPLANT
NS IRRIG 1000ML POUR BTL (IV SOLUTION) ×4 IMPLANT
PACK CV ACCESS (CUSTOM PROCEDURE TRAY) ×4 IMPLANT
PACK SURGICAL SETUP 50X90 (CUSTOM PROCEDURE TRAY) ×3 IMPLANT
PAD ARMBOARD 7.5X6 YLW CONV (MISCELLANEOUS) ×8 IMPLANT
PENCIL BUTTON HOLSTER BLD 10FT (ELECTRODE) ×1 IMPLANT
SOAP 2 % CHG 4 OZ (WOUND CARE) ×3 IMPLANT
STOCKINETTE 6  STRL (DRAPES) ×1
STOCKINETTE 6 STRL (DRAPES) IMPLANT
SUT ETHILON 3 0 PS 1 (SUTURE) ×4 IMPLANT
SUT MNCRL AB 4-0 PS2 18 (SUTURE) ×5 IMPLANT
SUT PROLENE 5 0 C 1 24 (SUTURE) ×2 IMPLANT
SUT PROLENE 6 0 BV (SUTURE) ×4 IMPLANT
SUT PROLENE 6 0 CC (SUTURE) ×1 IMPLANT
SUT SILK 0 TIES 10X30 (SUTURE) ×4 IMPLANT
SUT VIC AB 3-0 SH 27 (SUTURE) ×8
SUT VIC AB 3-0 SH 27X BRD (SUTURE) ×3 IMPLANT
SYR 10ML LL (SYRINGE) ×4 IMPLANT
SYR 20CC LL (SYRINGE) ×8 IMPLANT
SYR 5ML LL (SYRINGE) ×4 IMPLANT
SYR CONTROL 10ML LL (SYRINGE) ×4 IMPLANT
TAPE CLOTH SURG 4X10 WHT LF (GAUZE/BANDAGES/DRESSINGS) ×1 IMPLANT
TOWEL GREEN STERILE (TOWEL DISPOSABLE) ×4 IMPLANT
TOWEL GREEN STERILE FF (TOWEL DISPOSABLE) ×4 IMPLANT
TOWEL OR 17X26 4PK STRL BLUE (TOWEL DISPOSABLE) ×4 IMPLANT
UNDERPAD 30X30 (UNDERPADS AND DIAPERS) ×4 IMPLANT
WATER STERILE IRR 1000ML POUR (IV SOLUTION) ×4 IMPLANT

## 2019-04-04 NOTE — Anesthesia Preprocedure Evaluation (Addendum)
Anesthesia Evaluation  Patient identified by MRN, date of birth, ID band Patient awake    Reviewed: Allergy & Precautions, NPO status , Patient's Chart, lab work & pertinent test results  History of Anesthesia Complications Negative for: history of anesthetic complications  Airway Mallampati: II  TM Distance: >3 FB Neck ROM: Full    Dental  (+) Dental Advisory Given   Pulmonary neg pulmonary ROS,    breath sounds clear to auscultation       Cardiovascular hypertension, Pt. on home beta blockers and Pt. on medications + dysrhythmias Atrial Fibrillation  Rhythm:Regular Rate:Normal     Neuro/Psych negative neurological ROS  negative psych ROS   GI/Hepatic negative GI ROS, Neg liver ROS,   Endo/Other  diabetes  Renal/GU Dialysis and ESRFRenal disease  negative genitourinary   Musculoskeletal  (+) Arthritis ,   Abdominal   Peds negative pediatric ROS (+)  Hematology  (+) anemia ,   Anesthesia Other Findings   Reproductive/Obstetrics negative OB ROS                             Anesthesia Physical Anesthesia Plan  ASA: III  Anesthesia Plan: MAC   Post-op Pain Management:    Induction: Intravenous  PONV Risk Score and Plan: 1 and Treatment may vary due to age or medical condition and Propofol infusion  Airway Management Planned: Nasal Cannula  Additional Equipment: None  Intra-op Plan:   Post-operative Plan:   Informed Consent: I have reviewed the patients History and Physical, chart, labs and discussed the procedure including the risks, benefits and alternatives for the proposed anesthesia with the patient or authorized representative who has indicated his/her understanding and acceptance.     Dental advisory given  Plan Discussed with: CRNA and Surgeon  Anesthesia Plan Comments:        Anesthesia Quick Evaluation

## 2019-04-04 NOTE — Anesthesia Procedure Notes (Signed)
Procedure Name: MAC Date/Time: 04/04/2019 8:34 AM Performed by: Kathryne Hitch, CRNA Pre-anesthesia Checklist: Patient identified, Emergency Drugs available, Suction available, Patient being monitored and Timeout performed Patient Re-evaluated:Patient Re-evaluated prior to induction Oxygen Delivery Method: Simple face mask Preoxygenation: Pre-oxygenation with 100% oxygen Induction Type: IV induction Dental Injury: Teeth and Oropharynx as per pre-operative assessment

## 2019-04-04 NOTE — Transfer of Care (Signed)
Immediate Anesthesia Transfer of Care Note  Patient: Noah Vang  Procedure(s) Performed: INSERTION OF TUNNELED  DIALYSIS CATHETER (Right Neck) ARTERIOVENOUS (AV) FISTULA CREATION (Right Arm Lower) LIGATION OF ARTERIOVENOUS  FISTULA LEFT ARM (Left Arm Lower)  Patient Location: PACU  Anesthesia Type:MAC  Level of Consciousness: awake, alert  and patient cooperative  Airway & Oxygen Therapy: Patient Spontanous Breathing  Post-op Assessment: Report given to RN and Post -op Vital signs reviewed and stable  Post vital signs: Reviewed and stable  Last Vitals:  Vitals Value Taken Time  BP 105/78 04/04/2019 10:27 AM  Temp    Pulse 79 04/04/2019 10:27 AM  Resp 21 04/04/2019 10:27 AM  SpO2 99 % 04/04/2019 10:27 AM  Vitals shown include unvalidated device data.  Last Pain:  Vitals:   04/04/19 0652  TempSrc: Oral         Complications: No apparent anesthesia complications

## 2019-04-04 NOTE — Op Note (Signed)
Patient name: Noah Vang MRN: [id number] DOB: 1956/11/08 Sex: male  04/04/2019 Pre-operative Diagnosis: esrd, left innominate vein occlusion Post-operative diagnosis:  Same Surgeon:  Eda Paschal. Donzetta Matters, MD Assistant: Leontine Locket, PA Procedure Performed: 1.  US guided placement of right IJ 19cm tunneled dialysis catheter 2.  Ligation of left arm av fistula 3.  Creation of right first stage brachial-basilic vein fistula  Indications: 63 year old male with a history of end-stage renal disease.  He previously was on dialysis via left arm fistula.  He now has several weeks of left upper extremity swelling has been diagnosed with left innominate vein occlusion.  He is indicated for ligation of the left arm fistula to improve swelling.  Same time we will get right arm access as well as tunneled catheter placement.  Findings: The right IJ was patent and a catheter was placed the level of the SVC.  The left side fistula was ligated and he had a palpable left radial pulse.  On the right side there was a suitable basilic vein that easily dilated to 4 mm and at completion there was a very strong thrill as well as a palpable radial pulse.   Procedure:  The patient was identified in the holding area and taken to the operating room where is put supine operative table and MAC anesthesia induced.  He was initially sterilely prepped and draped in the bilateral neck and left upper extremity.  A timeout was called and antibiotics were administered.  We began using ultrasound to evaluate the right internal jugular vein which was noted to be patent and compressible.  There is anesthetized 1% lidocaine with epinephrine.  The vein was cannulated with a 18-gauge needle followed by wire.  Counterincision was made and a 19 cm catheter was tunneled.  The wire tract was serially dilated and introducer sheath was placed under fluoroscopic guidance.  Catheter was then placed to the level of the SVC introducer sheath  removed.  Catheter was assembled flushed with heparinized saline affixed the skin with 3-0 nylon suture.  Neck incision closed with 4 Monocryl dermal was placed at both sites and a sterile dressing was placed.    We then turned our attention to left upper extremity.  Ultrasound was used to identify near the anastomosis of the fistula.  He was anesthetized 1% lidocaine.  We made a transverse incision across the fistula.  I dissected out the fistula placed a vessel loop around.  I then clamped the fistula on either side and transected.  Both ends were oversewn with 5-0 Prolene suture in a running mattress fashion.  The wound was irrigated and hemostasis obtained he was closed in layers with Vicryl and Monocryl and Dermabond placed to level the skin.  All drapes were removed and now the right upper extremity was prepped and draped in again a timeout called.  Ultrasound was used to identify what appeared to be a suitable basilic vein measuring approximately 4 mm above the antecubitum.  The brachial artery was also identified there is a palpable brachial pulse.  The area was anesthetized 1% lidocaine with epinephrine.  Curvilinear incision was made above the antecubitum between the 2.  We first dissected out the vein protected the nerve marked for orientation.  Branches were taken between clips and ties.  I then dissected out the artery through the deep fascia and placed a vessel loop around this.  The vein was then transected distally and tied off.  This vein easily was dilated serially  to 4 mm and flushed with heparinized saline and clamped.  The artery was clamped distally proximally opened longitudinally flushed with heparinized saline both directions.  The vein was then sewn end-to-side with 6-0 Prolene suture.  Prior to completion anastomosis we allowed flushing all directions.  Upon completion there was a strong thrill in the vein that we did confirm with Doppler.  There is also palpable radial pulse at the  wrist.  Satisfied with this we obtained hemostasis irrigated wound closed in layers with Vicryl Monocryl.  He was then awakened anesthesia having tolerated procedure without immediate complication.  All counts were correct at completion.     EBL: 50 cc   Brandon C. Donzetta Matters, MD [facility] [place] Office: [phone] Pager: [phone]

## 2019-04-04 NOTE — H&P (Signed)
H+P    History of Present Illness: This is a 63 y.o. male with esrd.  He has previous revision of this fistula.  Has 2 weeks of hand swelling and now few days of upper extremity swelling throughout.  recent fistulogram demnostrated now salvageable left arm avf.      Past Medical History:  Diagnosis Date  . Anemia   . Diabetes mellitus without complication ([place])    no longer taking meds  . Dialysis patient ([place])   . Hypertension    no longer on meds, BP now low on dialysis  . Pneumonia   . Renal disorder          Past Surgical History:  Procedure Laterality Date  . CHOLECYSTECTOMY    . COLONOSCOPY    . DIALYSIS FISTULA CREATION    . FISTULA SUPERFICIALIZATION Left 93/57/0177   Procedure: PLICATION ARTERIOVENOUS FISTULA LEFT UPPER ARM;  Surgeon: Marty Heck, MD;  Location: [place];  Service: Vascular;  Laterality: Left;  . TEE WITHOUT CARDIOVERSION N/A 10/03/2014   Procedure: TRANSESOPHAGEAL ECHOCARDIOGRAM (TEE);  Surgeon: Thayer Headings, MD;  Location: [place] [facility];  Service: Cardiovascular;  Laterality: N/A;         Allergies  Allergen Reactions  . Benadryl [Diphenhydramine]     hallucinations  . Vancomycin Rash    hallucinations           Prior to Admission medications   Medication Sig Start Date End Date Taking? Authorizing Provider  acetaminophen (TYLENOL) 500 MG tablet Take 500 mg by mouth every 6 (six) hours as needed for mild pain.   Yes [provider]  metoprolol succinate (TOPROL-XL) 25 MG 24 hr tablet Take 25 mg by mouth at bedtime.  01/02/19  Yes [provider]  sevelamer carbonate (RENVELA) 800 MG tablet Take 800 mg by mouth 3 (three) times daily with meals.   Yes [provider]    Social History        Socioeconomic History  . Marital status: Unknown    Spouse name: Not on file  . Number of children: Not on file  . Years of education: Not on file  . Highest education  level: Not on file  Occupational History  . Not on file  Social Needs  . Financial resource strain: Not on file  . Food insecurity:    Worry: Not on file    Inability: Not on file  . Transportation needs:    Medical: Not on file    Non-medical: Not on file  Tobacco Use  . Smoking status: Never Smoker  . Smokeless tobacco: Never Used  Substance and Sexual Activity  . Alcohol use: Yes    Alcohol/week: 21.0 standard drinks    Types: 21 Shots of liquor per week  . Drug use: Not Currently  . Sexual activity: Not on file  Lifestyle  . Physical activity:    Days per week: Not on file    Minutes per session: Not on file  . Stress: Not on file  Relationships  . Social connections:    Talks on phone: Not on file    Gets together: Not on file    Attends religious service: Not on file    Active member of club or organization: Not on file    Attends meetings of clubs or organizations: Not on file    Relationship status: Not on file  . Intimate partner violence:    Fear of current or ex partner: Not on file  Emotionally abused: Not on file    Physically abused: Not on file    Forced sexual activity: Not on file  Other Topics Concern  . Not on file  Social History Narrative  . Not on file          Family History  Problem Relation Age of Onset  . Diabetes Mother   . Diabetes Father   . Cancer Father     ROS: left arm swelling  Physical Examination Vitals:   04/04/19 0652  BP: 138/67  Pulse: 71  Resp: 20  Temp: 97.7 F (36.5 C)  SpO2: 100%     General: No acute distress HENT: WNL, normocephalic Pulmonary: Nonlabored respirations Extremities: Left upper extremity nonpitting edema Musculoskeletal: no muscle wasting or atrophy       Neurologic: A&O X 3; Appropriate Affect ; SENSATION: normal; MOTOR FUNCTION:  moving all extremities equally. Speech is fluent/normal   CBC Labs(Brief)          Component Value  Date/Time   WBC 4.3 07/02/2017 1545   RBC 3.38 (L) 07/02/2017 1545   HGB 8.5 (L) 03/30/2019 0911   HCT 25.0 (L) 03/30/2019 0911   PLT 104 (L) 07/02/2017 1545   MCV 97.9 07/02/2017 1545   MCH 32.2 07/02/2017 1545   MCHC 32.9 07/02/2017 1545   RDW 14.8 07/02/2017 1545   LYMPHSABS 0.8 09/28/2014 1817   MONOABS 1.5 (H) 09/28/2014 1817   EOSABS 0.0 09/28/2014 1817   BASOSABS 0.0 09/28/2014 1817      BMET Labs(Brief)     Component Value Date/Time   NA 131 (L) 03/30/2019 0911   K 3.4 (L) 03/30/2019 0911   CL 92 (L) 07/02/2017 1545   CO2 26 07/02/2017 1545   GLUCOSE 84 03/30/2019 0911   BUN 35 (H) 07/02/2017 1545   CREATININE 9.10 (H) 07/02/2017 1545   CALCIUM 8.9 07/02/2017 1545   GFRNONAA 6 (L) 07/02/2017 1545   GFRAA 6 (L) 07/02/2017 1545      COAGS: RecentLabs  Lab Results  Component Value Date   INR 1.18 10/02/2014        ASSESSMENT/PLAN: This is a 63 y.o. male end-stage renal disease on dialysis via left upper arm fistula.  He has swelling of his left arm for a few weeks.  plan to ligate left arm avf, place tdc and right arm access avf or avg.  Brandon C. Donzetta Matters, MD [facility] Office: [phone] Pager: [phone]

## 2019-04-04 NOTE — Discharge Instructions (Signed)
° °  [facility]  Discharge Instructions  AV Fistula or Graft Surgery for Dialysis Access  Please refer to the following instructions for your post-procedure care. Your surgeon or physician assistant will discuss any changes with you.  Activity  You may drive the day following your surgery, if you are comfortable and no longer taking prescription pain medication. Resume full activity as the soreness in your incision resolves.  Bathing/Showering  You may shower after you go home. Keep your incision dry for 48 hours. Do not soak in a bathtub, hot tub, or swim until the incision heals completely. You may not shower if you have a hemodialysis catheter.  Incision Care  Clean your incision with mild soap and water after 48 hours. Pat the area dry with a clean towel. You do not need a bandage unless otherwise instructed. Do not apply any ointments or creams to your incision. You may have skin glue on your incision. Do not peel it off. It will come off on its own in about one week. Your arm may swell a bit after surgery. To reduce swelling use pillows to elevate your arm so it is above your heart. Your doctor will tell you if you need to lightly wrap your arm with an ACE bandage.  Diet  Resume your normal diet. There are not special food restrictions following this procedure. In order to heal from your surgery, it is CRITICAL to get adequate nutrition. Your body requires vitamins, minerals, and protein. Vegetables are the best source of vitamins and minerals. Vegetables also provide the perfect balance of protein. Processed food has little nutritional value, so try to avoid this.  Medications  Resume taking all of your medications. If your incision is causing pain, you may take over-the counter pain relievers such as acetaminophen (Tylenol). If you were prescribed a stronger pain medication, please be aware these medications can cause nausea and constipation. Prevent  nausea by taking the medication with a snack or meal. Avoid constipation by drinking plenty of fluids and eating foods with high amount of fiber, such as fruits, vegetables, and grains.  Do not take Tylenol if you are taking prescription pain medications.  Follow up Your surgeon may want to see you in the office following your access surgery. If so, this will be arranged at the time of your surgery.  Please call us immediately for any of the following conditions:  Increased pain, redness, drainage (pus) from your incision site Fever of 101 degrees or higher Severe or worsening pain at your incision site Hand pain or numbness.  Reduce your risk of vascular disease:  Stop smoking. If you would like help, call [facility] at 1-800-QUIT-NOW 603-231-2889) or [place] at [place] your cholesterol Maintain a desired weight Control your diabetes Keep your blood pressure down  Dialysis  It will take several weeks to several months for your new dialysis access to be ready for use. Your surgeon will determine when it is okay to use it. Your nephrologist will continue to direct your dialysis. You can continue to use your Permcath until your new access is ready for use.   04/04/2019 Noah Vang [id number] 1956-11-01  Surgeon(s): Waynetta Sandy, MD  Procedure(s): INSERTION OF TUNNELED  DIALYSIS CATHETER ARTERIOVENOUS (AV) FISTULA CREATION LIGATION OF ARTERIOVENOUS  FISTULA LEFT ARM  x Do not stick fistula for 12 weeks    If you have any questions, please call the office at [phone].

## 2019-04-05 ENCOUNTER — Encounter (HOSPITAL_COMMUNITY): Payer: Self-pay | Admitting: Vascular Surgery

## 2019-04-06 ENCOUNTER — Encounter (HOSPITAL_COMMUNITY): Payer: Self-pay | Admitting: Vascular Surgery

## 2019-04-06 NOTE — Anesthesia Postprocedure Evaluation (Signed)
Anesthesia Post Note  Patient: Noah Vang  Procedure(s) Performed: INSERTION OF TUNNELED  DIALYSIS CATHETER (Right Neck) ARTERIOVENOUS (AV) FISTULA CREATION (Right Arm Lower) LIGATION OF ARTERIOVENOUS  FISTULA LEFT ARM (Left Arm Lower)     Patient location during evaluation: PACU Anesthesia Type: MAC Level of consciousness: awake and alert Pain management: pain level controlled Vital Signs Assessment: post-procedure vital signs reviewed and stable Respiratory status: spontaneous breathing, nonlabored ventilation, respiratory function stable and patient connected to nasal cannula oxygen Cardiovascular status: stable and blood pressure returned to baseline Postop Assessment: no apparent nausea or vomiting Anesthetic complications: no    Last Vitals:  Vitals:   04/04/19 1055 04/04/19 1100  BP: (!) 171/85 (!) 171/85  Pulse: 74 72  Resp: 17 16  Temp:    SpO2: 97% 99%    Last Pain:  Vitals:   04/04/19 1030  TempSrc:   PainSc: 0-No pain                 Laurence Folz

## 2019-04-14 ENCOUNTER — Ambulatory Visit (HOSPITAL_COMMUNITY)
Admission: RE | Admit: 2019-04-14 | Discharge: 2019-04-14 | Disposition: A | Discharge status: Home / Self Care | Payer: Medicare Other | Source: Ambulatory Visit | Attending: Nurse Practitioner | Admitting: Nurse Practitioner

## 2019-04-14 ENCOUNTER — Other Ambulatory Visit: Payer: Self-pay

## 2019-04-14 DIAGNOSIS — D649 Anemia, unspecified: Secondary | ICD-10-CM | POA: Diagnosis present

## 2019-04-14 LAB — PREPARE RBC (CROSSMATCH)

## 2019-04-14 LAB — ABO/RH: ABO/RH(D): A POS

## 2019-04-14 MED ORDER — SODIUM CHLORIDE 0.9% IV SOLUTION
Freq: Once | INTRAVENOUS | Status: AC
  Administered 2019-04-14: 11:00:00 via INTRAVENOUS

## 2019-04-14 NOTE — Discharge Instructions (Signed)
Blood Transfusion, Adult, Care After This sheet gives you information about how to care for yourself after your procedure. Your doctor may also give you more specific instructions. If you have problems or questions, contact your doctor. Follow these instructions at home:   Take over-the-counter and prescription medicines only as told by your doctor.  Go back to your normal activities as told by your doctor.  Follow instructions from your doctor about how to take care of the area where an IV tube was put into your vein (insertion site). Make sure you: ? Wash your hands with soap and water before you change your bandage (dressing). If there is no soap and water, use hand sanitizer. ? Change your bandage as told by your doctor.  Check your IV insertion site every day for signs of infection. Check for: ? More redness, swelling, or pain. ? More fluid or blood. ? Warmth. ? Pus or a bad smell. Contact a doctor if:  You have more redness, swelling, or pain around the IV insertion site.  You have more fluid or blood coming from the IV insertion site.  Your IV insertion site feels warm to the touch.  You have pus or a bad smell coming from the IV insertion site.  Your pee (urine) turns pink, red, or brown.  You feel weak after doing your normal activities. Get help right away if:  You have signs of a serious allergic or body defense (immune) system reaction, including: ? Itchiness. ? Hives. ? Trouble breathing. ? Anxiety. ? Pain in your chest or lower back. ? Fever, flushing, and chills. ? Fast pulse. ? Rash. ? Watery poop (diarrhea). ? Throwing up (vomiting). ? Dark pee. ? Serious headache. ? Dizziness. ? Stiff neck. ? Yellow color in your face or the white parts of your eyes (jaundice). Summary  After a blood transfusion, return to your normal activities as told by your doctor.  Every day, check for signs of infection where the IV tube was put into your vein.  Some  signs of infection are warm skin, more redness and pain, more fluid or blood, and pus or a bad smell where the needle went in.  Contact your doctor if you feel weak or have any unusual symptoms. This information is not intended to replace advice given to you by your health care provider. Make sure you discuss any questions you have with your health care provider. Document Released: 12/07/2014 Document Revised: 07/10/2016 Document Reviewed: 07/10/2016 Elsevier Interactive Patient Education  2019 Elsevier Inc.  

## 2019-04-14 NOTE — Progress Notes (Signed)
                   Patient Care Center Note   Diagnosis: Anemia    Provider: Juanell Fairly, NP     Procedure: Transfuse 1 unit PRBC    Note: Patient typed and screen and received 1 unit of PRBC via PIV; tolerated well. Vitals taken and blood pressure elevated (blood pressure taken in left lower leg); patient asymptomatic. Discharge instructions reviewed. Patient alert, oriented at time of discharge. n  Otho Bellows, RN

## 2019-04-14 NOTE — Progress Notes (Signed)
Called dialysis center regarding access for transfusion.  They stated it was OK to use the arm with the fistula that was no longer being used for dialysis to place peripheral IV.

## 2019-04-14 NOTE — Progress Notes (Signed)
Blood bank requested Hgb value - spoke with the nurse at the dialysis center who reported the Hgb as 7.2. This information was given to the blood bank who will consult with the pathologist for permission to release the blood.

## 2019-04-16 LAB — BPAM RBC
Blood Product Expiration Date: 202005312359
ISSUE DATE / TIME: 202005151046
Unit Type and Rh: 6200

## 2019-04-16 LAB — TYPE AND SCREEN
ABO/RH(D): A POS
Antibody Screen: NEGATIVE
Unit division: 0

## 2019-04-19 ENCOUNTER — Telehealth: Payer: Self-pay | Admitting: Cardiology

## 2019-04-19 NOTE — Telephone Encounter (Signed)
LMTCB to schedule new patient appt with Dr. Percival Spanish, see referral. Please offer virtual visit.

## 2019-05-23 ENCOUNTER — Other Ambulatory Visit: Payer: Self-pay

## 2019-05-23 DIAGNOSIS — Z992 Dependence on renal dialysis: Secondary | ICD-10-CM

## 2019-05-23 DIAGNOSIS — N186 End stage renal disease: Secondary | ICD-10-CM

## 2019-05-26 ENCOUNTER — Encounter (HOSPITAL_COMMUNITY): Payer: Medicare Other

## 2019-05-26 ENCOUNTER — Encounter: Payer: Medicare Other | Admitting: Vascular Surgery

## 2019-05-26 ENCOUNTER — Encounter: Payer: Self-pay | Admitting: Family

## 2019-06-04 ENCOUNTER — Encounter (HOSPITAL_COMMUNITY): Payer: Self-pay | Admitting: Emergency Medicine

## 2019-06-04 ENCOUNTER — Inpatient Hospital Stay (HOSPITAL_COMMUNITY)
Admission: EM | Admit: 2019-06-04 | Discharge: 2019-06-07 | DRG: 314 | Disposition: A | Discharge status: Home / Self Care | Payer: Medicare Other | Attending: Family Medicine | Admitting: Family Medicine

## 2019-06-04 ENCOUNTER — Emergency Department (HOSPITAL_COMMUNITY): Payer: Medicare Other

## 2019-06-04 ENCOUNTER — Other Ambulatory Visit: Payer: Self-pay

## 2019-06-04 DIAGNOSIS — Z8679 Personal history of other diseases of the circulatory system: Secondary | ICD-10-CM

## 2019-06-04 DIAGNOSIS — Z789 Other specified health status: Secondary | ICD-10-CM | POA: Diagnosis not present

## 2019-06-04 DIAGNOSIS — I120 Hypertensive chronic kidney disease with stage 5 chronic kidney disease or end stage renal disease: Secondary | ICD-10-CM | POA: Diagnosis present

## 2019-06-04 DIAGNOSIS — D649 Anemia, unspecified: Secondary | ICD-10-CM

## 2019-06-04 DIAGNOSIS — F1020 Alcohol dependence, uncomplicated: Secondary | ICD-10-CM | POA: Diagnosis present

## 2019-06-04 DIAGNOSIS — N2581 Secondary hyperparathyroidism of renal origin: Secondary | ICD-10-CM | POA: Diagnosis present

## 2019-06-04 DIAGNOSIS — N186 End stage renal disease: Secondary | ICD-10-CM

## 2019-06-04 DIAGNOSIS — E46 Unspecified protein-calorie malnutrition: Secondary | ICD-10-CM | POA: Diagnosis present

## 2019-06-04 DIAGNOSIS — Z833 Family history of diabetes mellitus: Secondary | ICD-10-CM | POA: Diagnosis not present

## 2019-06-04 DIAGNOSIS — Z888 Allergy status to other drugs, medicaments and biological substances status: Secondary | ICD-10-CM

## 2019-06-04 DIAGNOSIS — L89312 Pressure ulcer of right buttock, stage 2: Secondary | ICD-10-CM | POA: Diagnosis present

## 2019-06-04 DIAGNOSIS — Z809 Family history of malignant neoplasm, unspecified: Secondary | ICD-10-CM | POA: Diagnosis not present

## 2019-06-04 DIAGNOSIS — E1122 Type 2 diabetes mellitus with diabetic chronic kidney disease: Secondary | ICD-10-CM | POA: Diagnosis present

## 2019-06-04 DIAGNOSIS — Z881 Allergy status to other antibiotic agents status: Secondary | ICD-10-CM | POA: Diagnosis not present

## 2019-06-04 DIAGNOSIS — T8242XA Displacement of vascular dialysis catheter, initial encounter: Secondary | ICD-10-CM | POA: Diagnosis present

## 2019-06-04 DIAGNOSIS — E8809 Other disorders of plasma-protein metabolism, not elsewhere classified: Secondary | ICD-10-CM | POA: Diagnosis present

## 2019-06-04 DIAGNOSIS — T829XXA Unspecified complication of cardiac and vascular prosthetic device, implant and graft, initial encounter: Secondary | ICD-10-CM

## 2019-06-04 DIAGNOSIS — I4891 Unspecified atrial fibrillation: Secondary | ICD-10-CM | POA: Diagnosis present

## 2019-06-04 DIAGNOSIS — E876 Hypokalemia: Secondary | ICD-10-CM | POA: Diagnosis not present

## 2019-06-04 DIAGNOSIS — T829XXD Unspecified complication of cardiac and vascular prosthetic device, implant and graft, subsequent encounter: Secondary | ICD-10-CM | POA: Diagnosis not present

## 2019-06-04 DIAGNOSIS — R710 Precipitous drop in hematocrit: Secondary | ICD-10-CM | POA: Diagnosis not present

## 2019-06-04 DIAGNOSIS — Z1159 Encounter for screening for other viral diseases: Secondary | ICD-10-CM

## 2019-06-04 DIAGNOSIS — Z6827 Body mass index (BMI) 27.0-27.9, adult: Secondary | ICD-10-CM | POA: Diagnosis not present

## 2019-06-04 DIAGNOSIS — Z992 Dependence on renal dialysis: Secondary | ICD-10-CM

## 2019-06-04 DIAGNOSIS — Z794 Long term (current) use of insulin: Secondary | ICD-10-CM | POA: Diagnosis not present

## 2019-06-04 HISTORY — DX: Sepsis due to methicillin susceptible Staphylococcus aureus: A41.01

## 2019-06-04 HISTORY — DX: Other infective spondylopathies, sacral and sacrococcygeal region: M46.58

## 2019-06-04 HISTORY — DX: Type 2 diabetes mellitus with diabetic chronic kidney disease: E11.22

## 2019-06-04 LAB — CBC WITH DIFFERENTIAL/PLATELET
Abs Immature Granulocytes: 0.04 10*3/uL (ref 0.00–0.07)
Basophils Absolute: 0 10*3/uL (ref 0.0–0.1)
Basophils Relative: 0 %
Eosinophils Absolute: 0 10*3/uL (ref 0.0–0.5)
Eosinophils Relative: 0 %
HCT: 20.2 % — ABNORMAL LOW (ref 39.0–52.0)
Hemoglobin: 6.4 g/dL — CL (ref 13.0–17.0)
Immature Granulocytes: 1 %
Lymphocytes Relative: 18 %
Lymphs Abs: 0.5 10*3/uL — ABNORMAL LOW (ref 0.7–4.0)
MCH: 32 pg (ref 26.0–34.0)
MCHC: 31.7 g/dL (ref 30.0–36.0)
MCV: 101 fL — ABNORMAL HIGH (ref 80.0–100.0)
Monocytes Absolute: 0.4 10*3/uL (ref 0.1–1.0)
Monocytes Relative: 13 %
Neutro Abs: 2.1 10*3/uL (ref 1.7–7.7)
Neutrophils Relative %: 68 %
Platelets: 197 10*3/uL (ref 150–400)
RBC: 2 MIL/uL — ABNORMAL LOW (ref 4.22–5.81)
RDW: 17.2 % — ABNORMAL HIGH (ref 11.5–15.5)
WBC: 3 10*3/uL — ABNORMAL LOW (ref 4.0–10.5)
nRBC: 1.3 % — ABNORMAL HIGH (ref 0.0–0.2)

## 2019-06-04 LAB — SARS CORONAVIRUS 2 BY RT PCR (HOSPITAL ORDER, PERFORMED IN ~~LOC~~ HOSPITAL LAB): SARS Coronavirus 2: NEGATIVE

## 2019-06-04 LAB — CBC
HCT: 20.9 % — ABNORMAL LOW (ref 39.0–52.0)
Hemoglobin: 7 g/dL — ABNORMAL LOW (ref 13.0–17.0)
MCH: 31.8 pg (ref 26.0–34.0)
MCHC: 33.5 g/dL (ref 30.0–36.0)
MCV: 95 fL (ref 80.0–100.0)
Platelets: 196 10*3/uL (ref 150–400)
RBC: 2.2 MIL/uL — ABNORMAL LOW (ref 4.22–5.81)
RDW: 17.2 % — ABNORMAL HIGH (ref 11.5–15.5)
WBC: 4.1 10*3/uL (ref 4.0–10.5)
nRBC: 0.5 % — ABNORMAL HIGH (ref 0.0–0.2)

## 2019-06-04 LAB — BASIC METABOLIC PANEL
Anion gap: 14 (ref 5–15)
BUN: 28 mg/dL — ABNORMAL HIGH (ref 8–23)
CO2: 24 mmol/L (ref 22–32)
Calcium: 8.5 mg/dL — ABNORMAL LOW (ref 8.9–10.3)
Chloride: 95 mmol/L — ABNORMAL LOW (ref 98–111)
Creatinine, Ser: 4.68 mg/dL — ABNORMAL HIGH (ref 0.61–1.24)
GFR calc Af Amer: 15 mL/min — ABNORMAL LOW (ref >60)
GFR calc non Af Amer: 13 mL/min — ABNORMAL LOW (ref >60)
Glucose, Bld: 151 mg/dL — ABNORMAL HIGH (ref 70–99)
Potassium: 3.1 mmol/L — ABNORMAL LOW (ref 3.5–5.1)
Sodium: 133 mmol/L — ABNORMAL LOW (ref 135–145)

## 2019-06-04 LAB — HEMOGLOBIN A1C
Hgb A1c MFr Bld: 5.1 % (ref 4.8–5.6)
Mean Plasma Glucose: 99.67 mg/dL

## 2019-06-04 LAB — GLUCOSE, CAPILLARY
Glucose-Capillary: 125 mg/dL — ABNORMAL HIGH (ref 70–99)
Glucose-Capillary: 175 mg/dL — ABNORMAL HIGH (ref 70–99)

## 2019-06-04 LAB — TSH: TSH: 2.52 u[IU]/mL (ref 0.350–4.500)

## 2019-06-04 LAB — PREPARE RBC (CROSSMATCH)

## 2019-06-04 LAB — PHOSPHORUS: Phosphorus: 1.1 mg/dL — ABNORMAL LOW (ref 2.5–4.6)

## 2019-06-04 LAB — ABO/RH: ABO/RH(D): A POS

## 2019-06-04 LAB — PROTIME-INR
INR: 1.1 (ref 0.8–1.2)
Prothrombin Time: 14.2 seconds (ref 11.4–15.2)

## 2019-06-04 MED ORDER — INSULIN ASPART 100 UNIT/ML ~~LOC~~ SOLN: 0.0000 [IU] | Freq: Every day | SUBCUTANEOUS | Status: DC

## 2019-06-04 MED ORDER — DICLOFENAC SODIUM 1 % TD GEL
2.0000 g | Freq: Four times a day (QID) | TRANSDERMAL | Status: AC
  Administered 2019-06-04 – 2019-06-06 (×4): 2 g via TOPICAL
  Filled 2019-06-04: qty 100

## 2019-06-04 MED ORDER — SODIUM CHLORIDE 0.9% IV SOLUTION
Freq: Once | INTRAVENOUS | Status: AC
  Administered 2019-06-04: 15:00:00 via INTRAVENOUS

## 2019-06-04 MED ORDER — INSULIN ASPART 100 UNIT/ML ~~LOC~~ SOLN
0.0000 [IU] | Freq: Three times a day (TID) | SUBCUTANEOUS | Status: DC
  Administered 2019-06-05: 1 [IU] via SUBCUTANEOUS
  Administered 2019-06-06: 3 [IU] via SUBCUTANEOUS
  Administered 2019-06-06 (×2): 1 [IU] via SUBCUTANEOUS

## 2019-06-04 MED ORDER — HEPARIN SODIUM (PORCINE) 5000 UNIT/ML IJ SOLN
5000.0000 [IU] | Freq: Three times a day (TID) | INTRAMUSCULAR | Status: DC
  Administered 2019-06-04 – 2019-06-05 (×2): 5000 [IU] via SUBCUTANEOUS
  Filled 2019-06-04 (×2): qty 1

## 2019-06-04 MED ORDER — METOPROLOL TARTRATE 12.5 MG HALF TABLET
12.5000 mg | ORAL_TABLET | Freq: Two times a day (BID) | ORAL | Status: DC
  Administered 2019-06-04 – 2019-06-07 (×6): 12.5 mg via ORAL
  Filled 2019-06-04 (×6): qty 1

## 2019-06-04 MED ORDER — ACETAMINOPHEN 325 MG PO TABS
650.0000 mg | ORAL_TABLET | Freq: Four times a day (QID) | ORAL | Status: DC | PRN
  Administered 2019-06-06 – 2019-06-07 (×2): 650 mg via ORAL
  Filled 2019-06-04 (×2): qty 2

## 2019-06-04 MED ORDER — CHLORHEXIDINE GLUCONATE CLOTH 2 % EX PADS
6.0000 | MEDICATED_PAD | Freq: Every day | CUTANEOUS | Status: DC
  Administered 2019-06-05 – 2019-06-06 (×2): 6 via TOPICAL

## 2019-06-04 MED ORDER — ACETAMINOPHEN 325 MG PO TABS
650.0000 mg | ORAL_TABLET | Freq: Once | ORAL | Status: AC
  Administered 2019-06-04: 650 mg via ORAL
  Filled 2019-06-04: qty 2

## 2019-06-04 MED ORDER — DILTIAZEM HCL-DEXTROSE 100-5 MG/100ML-% IV SOLN (PREMIX)
5.0000 mg/h | INTRAVENOUS | Status: DC
  Filled 2019-06-04: qty 100

## 2019-06-04 NOTE — H&P (Addendum)
[facility] Admission History and Physical Service Pager: [phone]  Patient name: Noah Vang Medical record number: [id number] Date of birth: Oct 31, 1956 Age: 63 y.o. Gender: male  Primary Care Provider: [facility] Consultants: Nephrology Code Status: full Preferred Emergency Contact: girlfriend Daine Gip [phone]  Chief Complaint: Supraclavicular cath out  Assessment and Plan: HUMPHREY GUERREIRO Vang is a 63 y.o. male presenting with need for vascular access secondary to accidentally removing his dialysis cath in his sleep. PMH is significant for ESRD, DM, HTN.  Need for vascular access 2/2 accidental temp dialysis cath removed- placed 2/2 patient graft not matured yet. Nephrology contacted IR to request placement tonight or tomorrow am to facilitate dialysis tomorrow. Did get his dialysis Friday so has not missed a session and does not appear to be volume overloaded on exam.  - Admit to care of Dr. McDiarmid. Progressive unit. -  IR consulted to reinstall cath - NPO at midnight - heparin for DVT ppx  Symptomatic Macrocytic Anemia- patient receives ESA at dialysis  blood pressure of 138/104, heart rate of 130 on admission.hemoglobin of 6.4, MCV 101. Per chart review, patient has had slowly worsening anemia over past few months. EKG showing a heart rate of 133, normal axis and a question of SVT vs sinus tachycardia. CXR positive for mild bibasilar subsegmental atelectasis. Patient was given one unit of packed rbcs in the ED. Etiology of anemia is likely multifactorial and includes AofCD, possible GI bleed, malnutrition (recent weight loss and alcohol use). Patient admits to two alcoholic beverages per day (vodka). He denies any bleeding from the cath removal site and there was none present on exam- negative for hematoma or other obvious sites of bleeding. - f/u reticulocyte count - f/u FOBT - Monitor Hbg post transfusion, again am - monitor  CIWAs - recommend colonoscopy outpatient to screen for colorectal cancer  Tachycardia (SVT vs Afib)- HR 120s-130s on admission. Home med: metoprolol 25 qday. Etiology likely contributed by anemia, and poor medication adherence/primary care follow up. Patient also has Afib on problem list but does not recall having this medication and is not on anticoagulation. ECG has regular rhythm with hard to distinguish p waves. Plan was to decrease HR and reevaluate ECG with diltiazem but HR reduced spontaneously and patient was started on home metoprolol dose.  - Metoprolol 12.92m BID - In Oct 2015 patient was admitted with Afib w/ RVR and put on Coreg - TSH - continuousTelemetry  - Continue to monitor vitals  ESRD on dialysis M/W/F- last dialysis 7/3. Per nephrology, has been under EDW at recent dialysis sessions 2/2 possible weight loss. May need to lower DW on dc. - Nephrology following, appreciate recs - f/u Phosphorus, Mg, renal fx panel - fluid restrict diet  DM- hx of insulin use, discontinued for multiple hypoglycemic episodes. Has not been on diabetic medication for several years. Last Hgb A1c in chart is 5.9 in 2015. Today is 5.1 (often falsely low in ESR patients) - sSSI - CBG monitoring   Qtc prolongation- 540 on initial ECG when HR was 133. (Can be inaccurate in rapid heart rates). Repeat ECG showed improved QTc to 497 with improved HR to 93 and p waves are easily visible at this time.  - repeat ECG am  Knee pain- chronic, takes tylenol at home which helps some. He requested cortisone shots which has helped in the past. - Acetaminophen prn  - topical Voltaren PRN  FEN/GI: Heart healthy, with renal with  fluid restrictions, NPO at midnight in preparation for [facility] placement tomorrow Prophylaxis: Heparin  Disposition: Pending medical workup  History of Present Illness:  Noah Vang is a 63 y.o. male presenting after accidentally pulling out his dialysis catheter in his sleep last  night. He denies having any pain at the cath site and did not have any bleeding from the site. He otherwise felt well and presented to get cath replaced prior to needing dialysis. On presentation, he was found to be tachycardic to HR 120s-130s and anemic with  Hgb of 6.4. He denies symptoms of anemia ie lightheadedness, HA, palpitations, SOB or any obvious bleeding. He has gotten several blood transfusions in the past but doesn't know of any source of bleeding. He has had diarrhea for the past couple days but he has intermittent constipation and then diarrhea chronically. PMH: ESRD on dialysis M/W/F, diabetes and a "fast heart rate". But his only medication is metoprolol which he states he took today. Was previously on insulin but discontinued due to frequent hypoglycemic incidents. He states his fast heart rate has been worked up many times over the past several years and that several times he has had a workup for atrial fibrillation but that he is never determined to be in it. He states he is not on any blood thinners and that his only current medication is metoprolol.   Review Of Systems: Per HPI with the following additions:  Review of Systems  Constitutional: Negative for chills and fever.  Respiratory: Negative for shortness of breath.   Cardiovascular: Negative for chest pain.  Gastrointestinal: Positive for diarrhea. Negative for nausea and vomiting.  Musculoskeletal: Positive for joint pain (knee pain).  Psychiatric/Behavioral: The patient is not nervous/anxious.     Patient Active Problem List   Diagnosis Date Noted  . Breakdown of surgically created AV fistula, init ([employer]) 11/15/2018  . Left leg weakness   . Septic arthritis of sacroiliac joint ([place])   . Type 2 diabetes mellitus with diabetic chronic kidney disease ([place])   . Staphylococcus aureus bacteremia with sepsis ([place])   . Spinal stenosis of lumbar region   . Screen for STD (sexually transmitted disease)   . Atrial  fibrillation ([place]) 09/29/2014  . CRBSI (catheter-related bloodstream infection) 09/28/2014  . ESRD on dialysis ([place]) 09/28/2014  . DM (diabetes mellitus) ([place]) 09/28/2014  . HTN (hypertension) 09/28/2014  . SVT (supraventricular tachycardia) ([place]) 09/28/2014    Past Medical History: Past Medical History:  Diagnosis Date  . Anemia   . Diabetes mellitus without complication ([place])    no longer taking meds  . Dialysis patient ([place])   . ESRD (end stage renal disease) ([place])    MWF- [facility]  . Hypertension    no longer on meds, BP now low on dialysis  . Pneumonia     Past Surgical History: Past Surgical History:  Procedure Laterality Date  . A/V FISTULAGRAM N/A 03/30/2019   Procedure: A/V FISTULAGRAM - Left Arm;  Surgeon: Waynetta Sandy, MD;  Location: [place] [facility];  Service: Cardiovascular;  Laterality: N/A;  . AV FISTULA PLACEMENT Right 04/04/2019   Procedure: ARTERIOVENOUS (AV) FISTULA CREATION;  Surgeon: Waynetta Sandy, MD;  Location: [facility];  Service: Vascular;  Laterality: Right;  . CHOLECYSTECTOMY    . COLONOSCOPY    . DIALYSIS FISTULA CREATION    . FISTULA SUPERFICIALIZATION Left 38/45/3646   Procedure: PLICATION ARTERIOVENOUS FISTULA LEFT UPPER ARM;  Surgeon: Marty Heck, MD;  Location: [facility];  Service: Vascular;  Laterality: Left;  . INSERTION OF DIALYSIS CATHETER Right 04/04/2019   Procedure: INSERTION OF TUNNELED  DIALYSIS CATHETER;  Surgeon: Waynetta Sandy, MD;  Location: [place];  Service: Vascular;  Laterality: Right;  . LIGATION OF ARTERIOVENOUS  FISTULA Left 04/04/2019   Procedure: LIGATION OF ARTERIOVENOUS  FISTULA LEFT ARM;  Surgeon: Waynetta Sandy, MD;  Location: [place];  Service: Vascular;  Laterality: Left;  . TEE WITHOUT CARDIOVERSION N/A 10/03/2014   Procedure: TRANSESOPHAGEAL ECHOCARDIOGRAM (TEE);  Surgeon: Thayer Headings, MD;  Location: [facility] ENDOSCOPY;  Service: Cardiovascular;  Laterality: N/A;     Social History: Social History   Tobacco Use  . Smoking status: Never Smoker  . Smokeless tobacco: Never Used  Substance Use Topics  . Alcohol use: Yes    Alcohol/week: 21.0 standard drinks    Types: 21 Shots of liquor per week  . Drug use: Not Currently   Additional social history:  Please also refer to relevant sections of EMR.  Family History: Family History  Problem Relation Age of Onset  . Diabetes Mother   . Diabetes Father   . Cancer Father     Allergies and Medications: Allergies  Allergen Reactions  . Benadryl [Diphenhydramine] Other (See Comments)    hallucinations  . Vancomycin Rash and Other (See Comments)    hallucinations   No current facility-administered medications on file prior to encounter.    Current Outpatient Medications on File Prior to Encounter  Medication Sig Dispense Refill  . acetaminophen (TYLENOL) 500 MG tablet Take 500 mg by mouth every 6 (six) hours as needed for mild pain.    . metoprolol succinate (TOPROL-XL) 25 MG 24 hr tablet Take 25 mg by mouth at bedtime.       Objective: BP 130/82   Pulse (!) 124   Temp 98.2 F (36.8 C) (Oral)   Resp (!) 24   SpO2 100%  Exam: General: Alert and oriented and in no apparent distress, appears euvolemic  EENTM: Eyes with no exudate, ocular motion grossly in tact. Pupils equal, round, conjunctival pallor present Neck: JVD present, non-tender to palpation, no thyroid masses  Cardiovascular: Tachycardia, no murmurs were appreciated.  Respiratory: CTA bilaterally, no increased WOB. Able to speak in full sentences Gastrointestinal: JVD was present. No abdominal pain, bowel sounds present.  Derm: Right supraclavicular cath site present with no bleeding or appreciable signs of infection. No signs of bleeding or hematoma. Left arm closed fistula site present on exam. Neuro: Patient alert and oriented, no focal deficits. MSK: negative for LE edema. Pain to bilateral knees Psych: Patient responses  appropriate  Labs and Imaging: CBC BMET  Recent Labs  Lab 06/04/19 1258  WBC 3.0*  HGB 6.4*  HCT 20.2*  PLT 197   Recent Labs  Lab 06/04/19 1258  NA 133*  K 3.1*  CL 95*  CO2 24  BUN 28*  CREATININE 4.68*  GLUCOSE 151*  CALCIUM 8.5*     EKG: my interpretation: tachycardic, regular rhythm, normal axis. SVT vs Sinus tach.    Lurline Del, MD 06/04/2019, 5:24 PM PGY-1, [place] Intern pager: [phone], text pages welcome  [place]    I have seen and examined this patient.     I have discussed the findings and exam with the intern and agree with the above note, which I have edited appropriately in [place]. I helped develop the management plan that is described in the resident's note,  and I agree with the content.   Doristine Mango, DO PGY-2 Family Medicine Resident

## 2019-06-04 NOTE — Consult Note (Addendum)
[facility] Renal Consultation Note    Indication for Consultation:  Management of ESRD/hemodialysis; anemia, hypertension/volume and secondary hyperparathyroidism  PFX:TKWIOX, Thayer [place]  HPI: Noah Vang is a 63 y.o. male with ESRD on HD MWF at [facility]. Past medical history significant for HTN and tachycardia on metoprolol.  Patient being admitted due to symptomatic anemia and need for dialysis access.    Patient seen and examined at bedside.  Reports he woke up in his recliner yesterday and noticed his catheter was no longer in his chest but instead was under his arm.  Denies bleeding, discharge, chest pain or SOB.  [place] placed by [facility] on 04/04/19 when they ligated his left arm AVF and created the right arm 1st stage brachial basilic vein fistula.  Reports fatigue and weakness worsening over the last 2 weeks.  States that he often feels too weak to stand up and walk.  He has been compliant with dialysis treatments, recently leaving under his EDW.  Last dialysis completed on Friday per regular schedule.    Hemoglobin noted to be dropping over the last 2 weeks from 8.9 to 7.5 on max ESA last dosed 6/29.     Pertinent findings include absent R IJ TDC, tachycardia, K 3.1, Hgb 6.4, and CXR with no acute findings.    Past Medical History:  Diagnosis Date  . Anemia   . Diabetes mellitus without complication ([place])    no longer taking meds  . Dialysis patient ([place])   . ESRD (end stage renal disease) ([place])    MWF- [facility]  . Hypertension    no longer on meds, BP now low on dialysis  . Pneumonia    Past Surgical History:  Procedure Laterality Date  . A/V FISTULAGRAM N/A 03/30/2019   Procedure: A/V FISTULAGRAM - Left Arm;  Surgeon: Waynetta Sandy, MD;  Location: [place] CV LAB;  Service: Cardiovascular;  Laterality: N/A;  . AV FISTULA PLACEMENT Right 04/04/2019   Procedure: ARTERIOVENOUS (AV) FISTULA CREATION;  Surgeon: Waynetta Sandy, MD;  Location: [place];  Service: Vascular;  Laterality: Right;  . CHOLECYSTECTOMY    . COLONOSCOPY    . DIALYSIS FISTULA CREATION    . FISTULA SUPERFICIALIZATION Left 73/53/2992   Procedure: PLICATION ARTERIOVENOUS FISTULA LEFT UPPER ARM;  Surgeon: Marty Heck, MD;  Location: [place];  Service: Vascular;  Laterality: Left;  . INSERTION OF DIALYSIS CATHETER Right 04/04/2019   Procedure: INSERTION OF TUNNELED  DIALYSIS CATHETER;  Surgeon: Waynetta Sandy, MD;  Location: [place];  Service: Vascular;  Laterality: Right;  . LIGATION OF ARTERIOVENOUS  FISTULA Left 04/04/2019   Procedure: LIGATION OF ARTERIOVENOUS  FISTULA LEFT ARM;  Surgeon: Waynetta Sandy, MD;  Location: [place];  Service: Vascular;  Laterality: Left;  . TEE WITHOUT CARDIOVERSION N/A 10/03/2014   Procedure: TRANSESOPHAGEAL ECHOCARDIOGRAM (TEE);  Surgeon: Thayer Headings, MD;  Location: [facility] ENDOSCOPY;  Service: Cardiovascular;  Laterality: N/A;   Family History  Problem Relation Age of Onset  . Diabetes Mother   . Diabetes Father   . Cancer Father    Social History:  reports that he has never smoked. He has never used smokeless tobacco. He reports current alcohol use of about 21.0 standard drinks of alcohol per week. He reports previous drug use. Allergies  Allergen Reactions  . Benadryl [Diphenhydramine] Other (See Comments)    hallucinations  . Vancomycin Rash and Other (See Comments)    hallucinations  Prior to Admission medications   Medication Sig Start Date End Date Taking? Authorizing Provider  acetaminophen (TYLENOL) 500 MG tablet Take 500 mg by mouth every 6 (six) hours as needed for mild pain.    [provider]  metoprolol succinate (TOPROL-XL) 25 MG 24 hr tablet Take 25 mg by mouth at bedtime.  01/02/19   [provider]  oxyCODONE (ROXICODONE) 5 MG immediate release tablet Take 1 tablet (5 mg total) by mouth every 6 (six) hours as needed. 04/04/19   Rhyne, Hulen Shouts, PA-C  sevelamer carbonate (RENVELA) 800 MG tablet Take 800 mg by mouth 3 (three) times daily with meals.    [provider]   Current Facility-Administered Medications  Medication Dose Route Frequency Provider Last Rate Last Dose  . 0.9 %  sodium chloride infusion (Manually program via Guardrails IV Fluids)   Intravenous Once Quintella Reichert, MD       Current Outpatient Medications  Medication Sig Dispense Refill  . acetaminophen (TYLENOL) 500 MG tablet Take 500 mg by mouth every 6 (six) hours as needed for mild pain.    . metoprolol succinate (TOPROL-XL) 25 MG 24 hr tablet Take 25 mg by mouth at bedtime.     Marland Kitchen oxyCODONE (ROXICODONE) 5 MG immediate release tablet Take 1 tablet (5 mg total) by mouth every 6 (six) hours as needed. 8 tablet 0  . sevelamer carbonate (RENVELA) 800 MG tablet Take 800 mg by mouth 3 (three) times daily with meals.     Labs: Basic Metabolic Panel: Recent Labs  Lab 06/04/19 1258  NA 133*  K 3.1*  CL 95*  CO2 24  GLUCOSE 151*  BUN 28*  CREATININE 4.68*  CALCIUM 8.5*   CBC: Recent Labs  Lab 06/04/19 1258  WBC 3.0*  NEUTROABS 2.1  HGB 6.4*  HCT 20.2*  MCV 101.0*  PLT 197   Studies/Results: Dg Chest Port 1 View  Result Date: 06/04/2019 CLINICAL DATA:  Catheter pulled out. EXAM: PORTABLE CHEST 1 VIEW COMPARISON:  Radiograph Apr 04, 2019. FINDINGS: The heart size and mediastinal contours are within normal limits. No pneumothorax or pleural effusion is noted. Mild bibasilar subsegmental atelectasis is noted. The visualized skeletal structures are unremarkable. IMPRESSION: No active disease. Electronically Signed   By: Marijo Conception M.D.   On: 06/04/2019 13:36    ROS: All others negative except those listed in HPI.  Physical Exam: Vitals:   06/04/19 1238 06/04/19 1300 06/04/19 1330 06/04/19 1415  BP: (!) 138/104 104/60 122/85 132/85  Pulse: (!) 130     Resp: 16 20 15 20   Temp: 98.2 F (36.8 C)     TempSrc: Oral     SpO2: 98%         General: WDWN, NAD, chronically ill appearing male, laying in bed Head: NCAT, mild scleral icterics, MMM Neck: Supple. No lymphadenopathy Lungs: CTA bilaterally. No wheeze, rales or rhonchi. Breathing is unlabored. Heart: +tachycardia, RR. No murmur, rubs or gallops.  Abdomen: soft, nontender, +BS, no guarding, no rebound tenderness Lower extremities:trace edema, no ischemic changes, or open wounds  Neuro: AAOx3. Moves all extremities spontaneously. Psych:  Responds to questions appropriately with a normal affect. Dialysis Access:  LU AVF maturing +b/t  Dialysis Orders:  MWF - [facility]  4hrs, BFR 450, DFR 800,  EDW 98kg, 3K/ 2.25Ca, P2  Access: LU AVF maturing -place 04/04/2019 by Dr. Donzetta Matters Heparin 3400 unit bolus Mircera 225 mcg q2wks - last 6/29  Last Labs: 05/31/2019: Hgb 7.5,  TSAT cannot be calculated, K 2.6, Ca 6.4, P<1, Alb 2.8, PTH 149 on 6/17  Assessment/Plan: 1.  Need for Dialysis access: TDC removed.  AVF maturing not yet ready for use.  ED consulted IR for [facility] placement, revised order to request placement tonight or tomorrow morning to facilitate dialysis tomorrow. 2. Symptomatic anemia, AOCA CKD - Hgb 6.4 - transfusion ordered. ESA recently dosed. Follow trends.  3.  ESRD -  On HD MWF.  Orders written for HD tomorrow following new access placement. 4K bath due to K 3.1.  Repeat labs in AM.  No heparin. 4.  Hypertension/volume  - Blood pressure well controlled.  Does not appear volume overloaded.  Likely losing weight, has been under EDW.  Titrate down as tolerated. May need lower dry on d/c. 5.  Secondary Hyperparathyroidism -  Ca 8.5. OP phos <1.  Rechecking phos today.  Not on VDRA or binders.  6.  Nutrition - NPO    Jen Mow, PA-C [facility] Pager: [phone] 06/04/2019, 2:31 PM   Patient was seen and examined in ER.  Chart and lab reviewed.  I agree with above consult by Jen Mow, PA. 63 year old male with history of hypertension, ESRD  on HD at [facility] via right [facility], has maturing left upper extremity AV fistula presented after the catheter was fell off while he was on recliner.  He did not notice significant bleeding however he was found to have hemoglobin of 6.4.  His last dialysis was Friday on uneventful as per patient. -He is getting blood transfusion. -Currently no access for dialysis, IR consulted for [facility] placement.  AV fistula is not mature enough.  Needs to follow-up with surgeon.  Keep n.p.o. post midnight. -Potassium is low and getting blood.  Monitor lab.  Use high K bath during dialysis, follow-up a.m. lab. He looks comfortable, in the room air.  No indication for dialysis today. I have discussed with the medical team and ER.  Lawson Radar, MD [facility].

## 2019-06-04 NOTE — ED Triage Notes (Signed)
Pt states for the past 8 weeks he has been using the port in his right upper chest for dialysis due to a failure fistula in his left arm. Pt was watching tv yesterday afternoon and states he just noticed his port was completely out. Pt denies knowing exactly how this happen or what time. Pt did have full dialysis treatment on  Friday. Pt denies any bleeding or pain. No bleeding or drainage from the site only a small scab.

## 2019-06-04 NOTE — ED Notes (Signed)
Report given to ryland RN

## 2019-06-04 NOTE — ED Triage Notes (Signed)
Girl friendMina [phone]

## 2019-06-04 NOTE — ED Notes (Signed)
Admitting at bedside 

## 2019-06-04 NOTE — Plan of Care (Signed)
Patient is scheduled for a new HD catheter for his dialysis, his AV fistula to right is still maturing and his left is old and not working.

## 2019-06-04 NOTE — ED Provider Notes (Signed)
[facility] EMERGENCY DEPARTMENT Provider Note   CSN: [id number] Arrival date & time: 06/04/19  1209    History   Chief Complaint Chief Complaint  Patient presents with  . Vascular Access Problem    HPI Noah Vang is a 63 y.o. male.     The history is provided by the patient and medical records. No language interpreter was used.   Noah Vang is a 63 y.o. male who presents to the Emergency Department complaining of skill or access problem. He has a history of ESR D and dialyzed is Monday, Wednesday, Friday. He has a vascular catheter in his right upper chest wall. Last night he fell asleep in the recliner and when he awoke this morning he noticed that the catheter was not in place. He states that it barely blood. He did not take his home metoprolol last night. He denies any recent illnesses. He denies any fevers, chest pain, shortness of breath, nausea, vomiting. Past Medical History:  Diagnosis Date  . Anemia   . Diabetes mellitus without complication ([place])    no longer taking meds  . Dialysis patient ([place])   . ESRD (end stage renal disease) ([place])    MWF- [facility]  . Hypertension    no longer on meds, BP now low on dialysis  . Pneumonia     Patient Active Problem List   Diagnosis Date Noted  . Breakdown of surgically created AV fistula, init ([place]) 11/15/2018  . Left leg weakness   . Septic arthritis of sacroiliac joint ([place])   . Type 2 diabetes mellitus with diabetic chronic kidney disease ([place])   . Staphylococcus aureus bacteremia with sepsis ([place])   . Spinal stenosis of lumbar region   . Screen for STD (sexually transmitted disease)   . Atrial fibrillation ([place]) 09/29/2014  . CRBSI (catheter-related bloodstream infection) 09/28/2014  . ESRD on dialysis ([place]) 09/28/2014  . DM (diabetes mellitus) ([place]) 09/28/2014  . HTN (hypertension) 09/28/2014  . SVT (supraventricular tachycardia) ([place]) 09/28/2014    Past Surgical  History:  Procedure Laterality Date  . A/V FISTULAGRAM N/A 03/30/2019   Procedure: A/V FISTULAGRAM - Left Arm;  Surgeon: Waynetta Sandy, MD;  Location: [place] CV LAB;  Service: Cardiovascular;  Laterality: N/A;  . AV FISTULA PLACEMENT Right 04/04/2019   Procedure: ARTERIOVENOUS (AV) FISTULA CREATION;  Surgeon: Waynetta Sandy, MD;  Location: [place];  Service: Vascular;  Laterality: Right;  . CHOLECYSTECTOMY    . COLONOSCOPY    . DIALYSIS FISTULA CREATION    . FISTULA SUPERFICIALIZATION Left 07/13/4817   Procedure: PLICATION ARTERIOVENOUS FISTULA LEFT UPPER ARM;  Surgeon: Marty Heck, MD;  Location: [place];  Service: Vascular;  Laterality: Left;  . INSERTION OF DIALYSIS CATHETER Right 04/04/2019   Procedure: INSERTION OF TUNNELED  DIALYSIS CATHETER;  Surgeon: Waynetta Sandy, MD;  Location: [place];  Service: Vascular;  Laterality: Right;  . LIGATION OF ARTERIOVENOUS  FISTULA Left 04/04/2019   Procedure: LIGATION OF ARTERIOVENOUS  FISTULA LEFT ARM;  Surgeon: Waynetta Sandy, MD;  Location: [place];  Service: Vascular;  Laterality: Left;  . TEE WITHOUT CARDIOVERSION N/A 10/03/2014   Procedure: TRANSESOPHAGEAL ECHOCARDIOGRAM (TEE);  Surgeon: Thayer Headings, MD;  Location: [place];  Service: Cardiovascular;  Laterality: N/A;        Home Medications    Prior to Admission medications   Medication Sig Start Date End Date Taking? Authorizing Provider  acetaminophen (TYLENOL)  500 MG tablet Take 500 mg by mouth every 6 (six) hours as needed for mild pain.    [provider]  metoprolol succinate (TOPROL-XL) 25 MG 24 hr tablet Take 25 mg by mouth at bedtime.  01/02/19   [provider]  oxyCODONE (ROXICODONE) 5 MG immediate release tablet Take 1 tablet (5 mg total) by mouth every 6 (six) hours as needed. 04/04/19   Rhyne, Hulen Shouts, PA-C  sevelamer carbonate (RENVELA) 800 MG tablet Take 800 mg by mouth 3 (three) times daily with meals.     [provider]    Family History Family History  Problem Relation Age of Onset  . Diabetes Mother   . Diabetes Father   . Cancer Father     Social History Social History   Tobacco Use  . Smoking status: Never Smoker  . Smokeless tobacco: Never Used  Substance Use Topics  . Alcohol use: Yes    Alcohol/week: 21.0 standard drinks    Types: 21 Shots of liquor per week  . Drug use: Not Currently     Allergies   Benadryl [diphenhydramine] and Vancomycin   Review of Systems Review of Systems  All other systems reviewed and are negative.    Physical Exam Updated Vital Signs BP 130/82   Pulse (!) 124   Temp 98.2 F (36.8 C) (Oral)   Resp (!) 24   SpO2 100%   Physical Exam Vitals signs and nursing note reviewed.  Constitutional:      Appearance: He is well-developed.  HENT:     Head: Normocephalic and atraumatic.  Cardiovascular:     Rate and Rhythm: Regular rhythm. Tachycardia present.     Heart sounds: No murmur.  Pulmonary:     Effort: Pulmonary effort is normal. No respiratory distress.     Comments: Right upper chest wall with half centimeter wound, no active bleeding. Abdominal:     Palpations: Abdomen is soft.     Tenderness: There is no abdominal tenderness. There is no guarding or rebound.  Musculoskeletal:        General: No tenderness.     Comments: There is a fistula in the right upper extremity with palpable thrill.  Skin:    General: Skin is warm and dry.  Neurological:     Mental Status: He is alert and oriented to person, place, and time.  Psychiatric:        Behavior: Behavior normal.      ED Treatments / Results  Labs (all labs ordered are listed, but only abnormal results are displayed) Labs Reviewed  BASIC METABOLIC PANEL - Abnormal; Notable for the following components:      Result Value   Sodium 133 (*)    Potassium 3.1 (*)    Chloride 95 (*)    Glucose, Bld 151 (*)    BUN 28 (*)    Creatinine, Ser 4.68 (*)     Calcium 8.5 (*)    GFR calc non Af Amer 13 (*)    GFR calc Af Amer 15 (*)    All other components within normal limits  CBC WITH DIFFERENTIAL/PLATELET - Abnormal; Notable for the following components:   WBC 3.0 (*)    RBC 2.00 (*)    Hemoglobin 6.4 (*)    HCT 20.2 (*)    MCV 101.0 (*)    RDW 17.2 (*)    nRBC 1.3 (*)    Lymphs Abs 0.5 (*)    All other components within normal  limits  SARS CORONAVIRUS 2 (HOSPITAL ORDER, [place] [facility])  PROTIME-INR  HEMOGLOBIN A1C  TYPE AND SCREEN  PREPARE RBC (CROSSMATCH)  ABO/RH    EKG EKG Interpretation  Date/Time:  Sunday June 04 2019 12:58:46 EDT Ventricular Rate:  133 PR Interval:    QRS Duration: 82 QT Interval:  363 QTC Calculation: 540 R Axis:   -4 Text Interpretation:  Sinus tachycardia Anterior infarct, old Prolonged QT interval Confirmed by Quintella Reichert [phone]) on 06/04/2019 1:07:27 PM   Radiology Dg Chest Port 1 View  Result Date: 06/04/2019 CLINICAL DATA:  Catheter pulled out. EXAM: PORTABLE CHEST 1 VIEW COMPARISON:  Radiograph Apr 04, 2019. FINDINGS: The heart size and mediastinal contours are within normal limits. No pneumothorax or pleural effusion is noted. Mild bibasilar subsegmental atelectasis is noted. The visualized skeletal structures are unremarkable. IMPRESSION: No active disease. Electronically Signed   By: Marijo Conception M.D.   On: 06/04/2019 13:36    Procedures Procedures (including critical care time) CRITICAL CARE Performed by: Quintella Reichert   Total critical care time: 35 minutes  Critical care time was exclusive of separately billable procedures and treating other patients.  Critical care was necessary to treat or prevent imminent or life-threatening deterioration.  Critical care was time spent personally by me on the following activities: development of treatment plan with patient and/or surrogate as well as nursing, discussions with consultants, evaluation of patient's  response to treatment, examination of patient, obtaining history from patient or surrogate, ordering and performing treatments and interventions, ordering and review of laboratory studies, ordering and review of radiographic studies, pulse oximetry and re-evaluation of patient's condition.  Medications Ordered in ED Medications  0.9 %  sodium chloride infusion (Manually program via Guardrails IV Fluids) ( Intravenous New Bag/Given 06/04/19 1444)     Initial Impression / Assessment and Plan / ED Course  I have reviewed the triage vital signs and the nursing notes.  Pertinent labs & imaging results that were available during my care of the patient were reviewed by me and considered in my medical decision making (see chart for details).        Patient with ESR D on hemodialysis here for evaluation of dislodged dialysis catheter. There is no active bleeding on evaluation the emergency department. He is noted to be tachycardic and he states that he has a history of the same. CBC is significant for acute on chronic anemia, question if this is contributing to his tachycardia. Plan to transfuse for symptomatic anemia. Discussed with nephrologist on-call. Family medicine consulted for admission. Interventional radiology consulted for vast cath replacement, unable to place today but he will be on the list for Monday or Tuesday replacement.  Final Clinical Impressions(s) / ED Diagnoses   Final diagnoses:  Symptomatic anemia  ESRD (end stage renal disease) ([place])  Displacement of vascular dialysis catheter, initial encounter [facility])    ED Discharge Orders    None       Quintella Reichert, MD 06/04/19 1506

## 2019-06-04 NOTE — ED Notes (Signed)
Xray called to preform xray

## 2019-06-04 NOTE — ED Notes (Signed)
Called main lab, added Hemoglobin A1c to blood in lab.

## 2019-06-05 ENCOUNTER — Inpatient Hospital Stay (HOSPITAL_COMMUNITY): Payer: Medicare Other

## 2019-06-05 ENCOUNTER — Encounter (HOSPITAL_COMMUNITY): Payer: Self-pay | Admitting: Family Medicine

## 2019-06-05 DIAGNOSIS — T829XXA Unspecified complication of cardiac and vascular prosthetic device, implant and graft, initial encounter: Secondary | ICD-10-CM

## 2019-06-05 DIAGNOSIS — L89312 Pressure ulcer of right buttock, stage 2: Secondary | ICD-10-CM

## 2019-06-05 DIAGNOSIS — N186 End stage renal disease: Secondary | ICD-10-CM

## 2019-06-05 DIAGNOSIS — Z794 Long term (current) use of insulin: Secondary | ICD-10-CM

## 2019-06-05 DIAGNOSIS — D649 Anemia, unspecified: Secondary | ICD-10-CM | POA: Diagnosis present

## 2019-06-05 DIAGNOSIS — T8242XA Displacement of vascular dialysis catheter, initial encounter: Principal | ICD-10-CM | POA: Diagnosis present

## 2019-06-05 DIAGNOSIS — E8809 Other disorders of plasma-protein metabolism, not elsewhere classified: Secondary | ICD-10-CM | POA: Diagnosis present

## 2019-06-05 DIAGNOSIS — Z789 Other specified health status: Secondary | ICD-10-CM

## 2019-06-05 DIAGNOSIS — Z992 Dependence on renal dialysis: Secondary | ICD-10-CM

## 2019-06-05 DIAGNOSIS — E1122 Type 2 diabetes mellitus with diabetic chronic kidney disease: Secondary | ICD-10-CM

## 2019-06-05 HISTORY — PX: IR FLUORO GUIDE CV LINE RIGHT: IMG2283

## 2019-06-05 HISTORY — PX: IR US GUIDE VASC ACCESS RIGHT: IMG2390

## 2019-06-05 LAB — RENAL FUNCTION PANEL
Albumin: 2 g/dL — ABNORMAL LOW (ref 3.5–5.0)
Albumin: 1.7 g/dL — ABNORMAL LOW (ref 3.5–5.0)
Anion gap: 15 (ref 5–15)
Anion gap: 28 — ABNORMAL HIGH (ref 5–15)
BUN: 34 mg/dL — ABNORMAL HIGH (ref 8–23)
BUN: 33 mg/dL — ABNORMAL HIGH (ref 8–23)
CO2: 27 mmol/L (ref 22–32)
CO2: 24 mmol/L (ref 22–32)
Calcium: 8.9 mg/dL (ref 8.9–10.3)
Calcium: 7.9 mg/dL — ABNORMAL LOW (ref 8.9–10.3)
Chloride: 94 mmol/L — ABNORMAL LOW (ref 98–111)
Chloride: 84 mmol/L — ABNORMAL LOW (ref 98–111)
Creatinine, Ser: 5.37 mg/dL — ABNORMAL HIGH (ref 0.61–1.24)
Creatinine, Ser: 5.3 mg/dL — ABNORMAL HIGH (ref 0.61–1.24)
GFR calc Af Amer: 12 mL/min — ABNORMAL LOW (ref >60)
GFR calc Af Amer: 12 mL/min — ABNORMAL LOW (ref >60)
GFR calc non Af Amer: 11 mL/min — ABNORMAL LOW (ref >60)
GFR calc non Af Amer: 11 mL/min — ABNORMAL LOW (ref >60)
Glucose, Bld: 144 mg/dL — ABNORMAL HIGH (ref 70–99)
Glucose, Bld: 482 mg/dL — ABNORMAL HIGH (ref 70–99)
Phosphorus: <1 mg/dL — CL (ref 2.5–4.6)
Phosphorus: <1 mg/dL — CL (ref 2.5–4.6)
Potassium: 3.7 mmol/L (ref 3.5–5.1)
Potassium: 2.9 mmol/L — ABNORMAL LOW (ref 3.5–5.1)
Sodium: 136 mmol/L (ref 135–145)
Sodium: 136 mmol/L (ref 135–145)

## 2019-06-05 LAB — PHOSPHORUS
Phosphorus: 1.9 mg/dL — ABNORMAL LOW (ref 2.5–4.6)
Phosphorus: <1 mg/dL — CL (ref 2.5–4.6)

## 2019-06-05 LAB — GLUCOSE, CAPILLARY
Glucose-Capillary: 146 mg/dL — ABNORMAL HIGH (ref 70–99)
Glucose-Capillary: 113 mg/dL — ABNORMAL HIGH (ref 70–99)
Glucose-Capillary: 91 mg/dL (ref 70–99)

## 2019-06-05 LAB — CBC
HCT: 21.8 % — ABNORMAL LOW (ref 39.0–52.0)
Hemoglobin: 7.3 g/dL — ABNORMAL LOW (ref 13.0–17.0)
MCH: 31.9 pg (ref 26.0–34.0)
MCHC: 33.5 g/dL (ref 30.0–36.0)
MCV: 95.2 fL (ref 80.0–100.0)
Platelets: 199 10*3/uL (ref 150–400)
RBC: 2.29 MIL/uL — ABNORMAL LOW (ref 4.22–5.81)
RDW: 17.8 % — ABNORMAL HIGH (ref 11.5–15.5)
WBC: 4.4 10*3/uL (ref 4.0–10.5)
nRBC: 0.5 % — ABNORMAL HIGH (ref 0.0–0.2)

## 2019-06-05 LAB — RETICULOCYTES
Immature Retic Fract: 14.7 % (ref 2.3–15.9)
RBC.: 2.29 MIL/uL — ABNORMAL LOW (ref 4.22–5.81)
Retic Count, Absolute: 13.7 10*3/uL — ABNORMAL LOW (ref 19.0–186.0)
Retic Ct Pct: 0.6 % (ref 0.4–3.1)

## 2019-06-05 LAB — HIV ANTIBODY (ROUTINE TESTING W REFLEX): HIV Screen 4th Generation wRfx: NONREACTIVE

## 2019-06-05 MED ORDER — LIDOCAINE HCL 1 % IJ SOLN
INTRAMUSCULAR | Status: AC
  Filled 2019-06-05: qty 20

## 2019-06-05 MED ORDER — SODIUM CHLORIDE 0.9 % IV SOLN: 100.0000 mL | INTRAVENOUS | Status: DC | PRN

## 2019-06-05 MED ORDER — POTASSIUM & SODIUM PHOSPHATES 280-160-250 MG PO PACK
1.0000 | PACK | Freq: Three times a day (TID) | ORAL | Status: DC
  Administered 2019-06-05: 1 via ORAL
  Filled 2019-06-05 (×3): qty 1

## 2019-06-05 MED ORDER — CEFAZOLIN SODIUM-DEXTROSE 2-4 GM/100ML-% IV SOLN
2.0000 g | INTRAVENOUS | Status: AC
  Administered 2019-06-05: 2 g via INTRAVENOUS

## 2019-06-05 MED ORDER — HEPARIN SODIUM (PORCINE) 5000 UNIT/ML IJ SOLN
5000.0000 [IU] | Freq: Three times a day (TID) | INTRAMUSCULAR | Status: DC
  Administered 2019-06-06 – 2019-06-07 (×4): 5000 [IU] via SUBCUTANEOUS
  Filled 2019-06-05 (×4): qty 1

## 2019-06-05 MED ORDER — LIDOCAINE-PRILOCAINE 2.5-2.5 % EX CREA: 1.0000 "application " | TOPICAL_CREAM | CUTANEOUS | Status: DC | PRN

## 2019-06-05 MED ORDER — MIDAZOLAM HCL 2 MG/2ML IJ SOLN
INTRAMUSCULAR | Status: AC
  Filled 2019-06-05: qty 2

## 2019-06-05 MED ORDER — LIDOCAINE HCL (PF) 1 % IJ SOLN: 5.0000 mL | INTRAMUSCULAR | Status: DC | PRN

## 2019-06-05 MED ORDER — PENTAFLUOROPROP-TETRAFLUOROETH EX AERO: 1.0000 "application " | INHALATION_SPRAY | CUTANEOUS | Status: DC | PRN

## 2019-06-05 MED ORDER — MIDAZOLAM HCL 2 MG/2ML IJ SOLN
INTRAMUSCULAR | Status: AC | PRN
  Administered 2019-06-05: 1 mg via INTRAVENOUS

## 2019-06-05 MED ORDER — FENTANYL CITRATE (PF) 100 MCG/2ML IJ SOLN
INTRAMUSCULAR | Status: AC
  Filled 2019-06-05: qty 2

## 2019-06-05 MED ORDER — SODIUM PHOSPHATES 45 MMOLE/15ML IV SOLN
30.0000 mmol | Freq: Once | INTRAVENOUS | Status: AC
  Administered 2019-06-05: 30 mmol via INTRAVENOUS
  Filled 2019-06-05: qty 10

## 2019-06-05 MED ORDER — METOPROLOL TARTRATE 5 MG/5ML IV SOLN: 5.0000 mg | Freq: Once | INTRAVENOUS | Status: DC

## 2019-06-05 MED ORDER — HEPARIN SODIUM (PORCINE) 1000 UNIT/ML IJ SOLN
INTRAMUSCULAR | Status: AC
  Administered 2019-06-05: 3200 [IU] via INTRAVENOUS_CENTRAL
  Filled 2019-06-05: qty 4

## 2019-06-05 MED ORDER — LIDOCAINE HCL (PF) 1 % IJ SOLN
INTRAMUSCULAR | Status: AC | PRN
  Administered 2019-06-05: 15 mL

## 2019-06-05 MED ORDER — GELATIN ABSORBABLE 12-7 MM EX MISC
CUTANEOUS | Status: AC
  Filled 2019-06-05: qty 1

## 2019-06-05 MED ORDER — FENTANYL CITRATE (PF) 100 MCG/2ML IJ SOLN
INTRAMUSCULAR | Status: AC | PRN
  Administered 2019-06-05: 50 ug via INTRAVENOUS
  Administered 2019-06-05: 25 ug via INTRAVENOUS

## 2019-06-05 MED ORDER — HEPARIN SODIUM (PORCINE) 1000 UNIT/ML DIALYSIS
1000.0000 [IU] | INTRAMUSCULAR | Status: DC | PRN
  Administered 2019-06-05: 3200 [IU] via INTRAVENOUS_CENTRAL
  Filled 2019-06-05: qty 1

## 2019-06-05 MED ORDER — HEPARIN SODIUM (PORCINE) 1000 UNIT/ML IJ SOLN
INTRAMUSCULAR | Status: AC
  Administered 2019-06-05: 3200 [IU]
  Filled 2019-06-05: qty 1

## 2019-06-05 MED ORDER — ALTEPLASE 2 MG IJ SOLR: 2.0000 mg | Freq: Once | INTRAMUSCULAR | Status: DC | PRN

## 2019-06-05 MED ORDER — METOPROLOL TARTRATE 5 MG/5ML IV SOLN
INTRAVENOUS | Status: AC
  Filled 2019-06-05: qty 5

## 2019-06-05 MED ORDER — CEFAZOLIN SODIUM-DEXTROSE 2-4 GM/100ML-% IV SOLN
INTRAVENOUS | Status: AC
  Filled 2019-06-05: qty 100

## 2019-06-05 NOTE — Progress Notes (Signed)
Pt noted with critical phosphorus lab less than 1.0 during Hemodialysis.  I the Probation officer notified Dialysis RN of lab.  RN stated he would make nephrologist aware.  Pt received order for phosphorus administration.  Hemodialysis RN gave report and made me aware to administer medication when pt returns.  No po medications was given in dialysis.

## 2019-06-05 NOTE — Progress Notes (Signed)
Family Medicine Teaching Service Daily Progress Note Intern Pager: [phone]  Patient name: Noah Vang Medical record number: [id number] Date of birth: 25-Dec-1955 Age: 63 y.o. Gender: male  Primary Care Provider: [facility] Consultants: Radiology Code Status: Full  Pt Overview and Major Events to Date:  7/5 - admitted through ED  Assessment and Plan:  Need for vascular access 2/2 accidental temp dialysis cath removed - Had last dialysis Friday, next dialysis would be today -  IR consulted to reinstall cath - NPO at midnight - heparin for DVT ppx  Symptomatic Macrocytic Anemia-  - f/u reticulocyte count - absolute - decreased at 13.7, immature retic fraction - wnl - f/u FOBT - Monitor Hbg - Hgb was 8.5 on admission, 7.3 this a.m. (7/6) - monitor CIWAs - recommend colonoscopy outpatient to screen for colorectal cancer - Phos was low at <1.0 on morning labs, ordered redraw to confirm. Patient refused redraw.  Tachycardia (SVT vs Afib)-Home med: metoprolol 25 qday.  - Metoprolol 12.5mg  BID  - TSH wnl - 2.520 - continuousTelemetry  - Continue to monitor vitals  ESRD on dialysis M/W/F- last dialysis 7/3. - Nephrology following, appreciate recs - f/u Phosphorus, Mg, renal fx panel - fluid restrict diet  DM- with hx of insulin that was discontinued for hypoglycemic episodes hx of insulin use, discontinued for multiple hypoglycemic episodes.  - sSSI - CBG monitoring   Hypophosphatemia: - Phos under 1.0 this a.m. - Stat recheck and PTH ordered - If confirmed plan is to consult renal.  Qtc prolongation-  - 540 on initial ECG w/ HR 133 - 497 on repeat with HR 93 - 482 on 7/6 a.m. - continue to monitor  Chronic knee pain-  - Acetaminophen prn  - topical Voltaren PRN  FEN/GI: NPO currently PPx:  Heparin  Disposition: Pending medical workup   Subjective:  Patient with no complaints this morning. Denies sob, cp, abd pain or other issues over  night. Patient got heparin this morning so plan per radiology is to replace HD cath this afternoon, afte rwhich patient can resume dialysis. Patient's phos was low this morning at <1.0. Stat recheck was ordered along with PTH. Plan to consult with renal if confirmed. Update: patient refused redraw of labs to recheck phos.  Objective: Temp:  [98 F (36.7 C)-98.3 F (36.8 C)] 98 F (36.7 C) (07/06 0323) Pulse Rate:  [85-130] 87 (07/06 0400) Resp:  [14-25] 18 (07/06 0400) BP: (104-169)/(57-104) 167/78 (07/06 0400) SpO2:  [98 %-100 %] 99 % (07/06 0400) Weight:  [95.7 kg-96.1 kg] 96.1 kg (07/06 0323) Physical Exam: General: Alert and oriented in no apparent distress Heart: Regular rate and rhythm with no murmurs appreciated, bpm 99 Lungs: CTA bilaterally, no wheezing Abdomen: Bowel sounds present, no abdominal pain Skin: Warm and dry Extremities: No lower extremity edema  Laboratory: Recent Labs  Lab 06/04/19 1258 06/04/19 1956 06/05/19 0606  WBC 3.0* 4.1 4.4  HGB 6.4* 7.0* 7.3*  HCT 20.2* 20.9* 21.8*  PLT 197 196 199   Recent Labs  Lab 06/04/19 1258  NA 133*  K 3.1*  CL 95*  CO2 24  BUN 28*  CREATININE 4.68*  CALCIUM 8.5*  GLUCOSE 151*     Imaging/Diagnostic Tests: Dg Chest Port 1 View  Result Date: 06/04/2019 CLINICAL DATA:  Catheter pulled out. EXAM: PORTABLE CHEST 1 VIEW COMPARISON:  Radiograph Apr 04, 2019. FINDINGS: The heart size and mediastinal contours are within normal limits. No pneumothorax or pleural effusion is noted. Mild  bibasilar subsegmental atelectasis is noted. The visualized skeletal structures are unremarkable. IMPRESSION: No active disease. Electronically Signed   By: Marijo Conception M.D.   On: 06/04/2019 13:36    Lurline Del, MD 06/05/2019, 6:33 AM PGY-1, [place] Intern pager: [phone], text pages welcome

## 2019-06-05 NOTE — Progress Notes (Addendum)
  0845 Bedside report received. Pt resting in bed, NAD, no complaints, safety intact, WCTM>   1000 Pt assessed see flowsheet, medicated per MAR, awaiting procedure.   1222 Bedside report given to Emer, RN. Pt resting comfortably, NAD, safety intact, WCTM.   1700 Resumed the care of the patient. Pt down to IR for HD cath placement. NAD on time of transfer.   1735 Report received from IR about HD line placement. IR RN stated patient was refusing to go the HD but they were going to send him anyway and have HD RN speak to patient.

## 2019-06-05 NOTE — Procedures (Signed)
Interventional Radiology Procedure:   Indications: ESRD and needs new access  Procedure: Tunneled HD catheter placement  Findings: Right jugular Palindrome (19 cm), tip at SVC/RA junction  Complications: None     EBL: less than 20 ml  Plan: HD catheter is ready to use.     Shanine Kreiger R. Anselm Pancoast, MD  Pager: [phone]

## 2019-06-05 NOTE — Progress Notes (Signed)
Spoke with IM resident regarding hemoglobin of 7.0 after 1 unit of blood, was 6.4, will continue to monitor, no further orders received.

## 2019-06-05 NOTE — Progress Notes (Signed)
Renal Navigator notified OP HD clinic/SW of patient's admission and negative COVID 19 test result in order to provide continuity of care and safety.  Alphonzo Cruise, [place] Renal Navigator [phone]

## 2019-06-05 NOTE — Progress Notes (Signed)
[facility] Progress Note  Subjective: no new c/o today  Vitals:   06/05/19 0323 06/05/19 0400 06/05/19 0600 06/05/19 0700  BP: (!) 169/85 (!) 167/78 (!) 173/77 (!) 174/87  Pulse: 96 87  91  Resp: (!) 23 18  18   Temp: 98 F (36.7 C)   98 F (36.7 C)  TempSrc: Oral   Oral  SpO2: 100% 99%  100%  Weight: 96.1 kg     Height:        Inpatient medications: . Chlorhexidine Gluconate Cloth  6 each Topical Q0600  . diclofenac sodium  2 g Topical QID  . [START ON 06/06/2019] heparin  5,000 Units Subcutaneous Q8H  . insulin aspart  0-5 Units Subcutaneous QHS  . insulin aspart  0-9 Units Subcutaneous TID WC  . metoprolol tartrate  12.5 mg Oral BID   .  ceFAZolin (ANCEF) IV    . sodium phosphate  Dextrose 5% IVPB     acetaminophen **AND** [COMPLETED] acetaminophen    Exam: General: WDWN, NAD, chronically ill appearing male, laying in bed Head: NCAT, mild scleral icterics, MMM Neck: Supple. No lymphadenopathy Lungs: CTA bilaterally. No wheeze, rales or rhonchi. Breathing is unlabored. Heart: +tachycardia, RR. No murmur, rubs or gallops.  Abdomen: soft, nontender, +BS, no guarding, no rebound tenderness Lower extremities:trace edema, no ischemic changes, or open wounds , +muscle wasting x 4 ext Neuro: AAOx3. Moves all extremities spontaneously. Psych:  Responds to questions appropriately with a normal affect. Dialysis Access:  LU AVF maturing +b/t  Dialysis:  MWF  [facility]  4h  450/800  98kg  3K/2.25  LUAVF maturing (04/04/2019 by Dr. Donzetta Matters) Heparin 3400 unit bolus Mircera 225 mcg q2wks - last 6/29  Last Labs: 05/31/2019: Hgb 7.5, TSAT cannot be calculated, K 2.6, Ca 6.4, P<1, Alb 2.8, PTH 149 on 6/17  Assessment/Plan: 1.  Need for Dialysis access: TDC dislodged at home. AVF maturing but not yet ready for use.  ED consulted IR for [facility] placement, revised order to request placement tonight or tomorrow morning to facilitate dialysis tomorrow. 2. Symptomatic anemia, AOCA  CKD - Hgb 6.4 - transfusion ordered. ESA recently dosed. Follow trends.  3.  ESRD -  On HD MWF.  Orders written for HD today following new access placement.  No heparin. 4.  Hypertension/volume  - Blood pressure well controlled.  Does not appear volume overloaded.  Likely losing weight, has been under EDW.  Titrate down as tolerated. May need lower dry on d/c. 5.  Secondary Hyperparathyroidism -  Ca 8.5. OP phos <1.  Not on VDRA or binders.   7.  ETOH abuse 8.  Hypophos - due to malnutrition/ etoh abuse most likely 9.  Hypoalbuminemia - c/w malnutrition   Rob [facility] 06/05/2019, 11:17 AM  Iron/TIBC/Ferritin/ %Sat    Component Value Date/Time   IRON 23 (L) 10/06/2014 1200   TIBC 165 (L) 10/06/2014 1200   IRONPCTSAT 14 (L) 10/06/2014 1200   Recent Labs  Lab 06/04/19 1258 06/05/19 0606  NA 133* 136  K 3.1* 3.7  CL 95* 94*  CO2 24 27  GLUCOSE 151* 144*  BUN 28* 34*  CREATININE 4.68* 5.37*  CALCIUM 8.5* 8.9  PHOS  --  <1.0*  ALBUMIN  --  2.0*  INR 1.1  --    No results for input(s): AST, ALT, ALKPHOS, BILITOT, PROT in the last 168 hours. Recent Labs  Lab 06/05/19 0606  WBC 4.4  HGB 7.3*  HCT 21.8*  PLT  199      

## 2019-06-05 NOTE — Consult Note (Signed)
Chief Complaint: Patient was seen in consultation today for tunneled dialysis catheter placement Chief Complaint  Patient presents with  . Vascular Access Problem   at the request of Dr Katheran James  Supervising Physician: Markus Daft  Patient Status: [facility] - In-pt  History of Present Illness: Noah Vang is a 63 y.o. male   ESRD HTN; Tachycardia Admitted for symptomatic anemia and need for dialysis access  Left arm dialysis AVF placed by [facility] 04/04/19 Tunneled dialysis catheter at same time Accidental dislodgment of catheter this weekend  Last dialysis Friday-- complete  Now needs new tunneled catheter For dialysis possibly today Did get Hep inj this am Will HOLD Hep rest of day-- plan for catheter placement this afternoon   Past Medical History:  Diagnosis Date  . Anemia   . Diabetes mellitus without complication ([place])    no longer taking meds  . Dialysis patient ([place])   . ESRD (end stage renal disease) ([place])    MWF- [facility]  . Hypertension    no longer on meds, BP now low on dialysis  . Pneumonia     Past Surgical History:  Procedure Laterality Date  . A/V FISTULAGRAM N/A 03/30/2019   Procedure: A/V FISTULAGRAM - Left Arm;  Surgeon: Waynetta Sandy, MD;  Location: [place];  Service: Cardiovascular;  Laterality: N/A;  . AV FISTULA PLACEMENT Right 04/04/2019   Procedure: ARTERIOVENOUS (AV) FISTULA CREATION;  Surgeon: Waynetta Sandy, MD;  Location: [place];  Service: Vascular;  Laterality: Right;  . CHOLECYSTECTOMY    . COLONOSCOPY    . DIALYSIS FISTULA CREATION    . FISTULA SUPERFICIALIZATION Left 40/06/6760   Procedure: PLICATION ARTERIOVENOUS FISTULA LEFT UPPER ARM;  Surgeon: Marty Heck, MD;  Location: [place];  Service: Vascular;  Laterality: Left;  . INSERTION OF DIALYSIS CATHETER Right 04/04/2019   Procedure: INSERTION OF TUNNELED  DIALYSIS CATHETER;  Surgeon: Waynetta Sandy, MD;  Location:  [place];  Service: Vascular;  Laterality: Right;  . LIGATION OF ARTERIOVENOUS  FISTULA Left 04/04/2019   Procedure: LIGATION OF ARTERIOVENOUS  FISTULA LEFT ARM;  Surgeon: Waynetta Sandy, MD;  Location: [place];  Service: Vascular;  Laterality: Left;  . TEE WITHOUT CARDIOVERSION N/A 10/03/2014   Procedure: TRANSESOPHAGEAL ECHOCARDIOGRAM (TEE);  Surgeon: Thayer Headings, MD;  Location: [facility] ENDOSCOPY;  Service: Cardiovascular;  Laterality: N/A;    Allergies: Benadryl [diphenhydramine] and Vancomycin  Medications: Prior to Admission medications   Medication Sig Start Date End Date Taking? Authorizing Provider  acetaminophen (TYLENOL) 500 MG tablet Take 500 mg by mouth every 6 (six) hours as needed for mild pain.   Yes [provider]  metoprolol succinate (TOPROL-XL) 25 MG 24 hr tablet Take 25 mg by mouth at bedtime.  01/02/19  Yes [provider]     Family History  Problem Relation Age of Onset  . Diabetes Mother   . Diabetes Father   . Cancer Father     Social History   Socioeconomic History  . Marital status: Unknown    Spouse name: Not on file  . Number of children: Not on file  . Years of education: Not on file  . Highest education level: Not on file  Occupational History  . Not on file  Social Needs  . Financial resource strain: Not on file  . Food insecurity    Worry: Not on file    Inability: Not on file  . Transportation needs  Medical: Not on file    Non-medical: Not on file  Tobacco Use  . Smoking status: Never Smoker  . Smokeless tobacco: Never Used  Substance and Sexual Activity  . Alcohol use: Yes    Alcohol/week: 21.0 standard drinks    Types: 21 Shots of liquor per week  . Drug use: Not Currently  . Sexual activity: Not on file  Lifestyle  . Physical activity    Days per week: Not on file    Minutes per session: Not on file  . Stress: Not on file  Relationships  . Social Herbalist on phone: Not on file    Gets  together: Not on file    Attends religious service: Not on file    Active member of club or organization: Not on file    Attends meetings of clubs or organizations: Not on file    Relationship status: Not on file  Other Topics Concern  . Not on file  Social History Narrative  . Not on file    Review of Systems: A 12 point ROS discussed and pertinent positives are indicated in the HPI above.  All other systems are negative.  Review of Systems  Constitutional: Positive for activity change, appetite change and fatigue. Negative for fever.  Respiratory: Negative for cough and shortness of breath.   Cardiovascular: Negative for chest pain.  Gastrointestinal: Negative for abdominal pain.  Neurological: Positive for weakness.  Psychiatric/Behavioral: Negative for behavioral problems and confusion.    Vital Signs: BP (!) 174/87 (BP Location: Right Leg)   Pulse 91   Temp 98 F (36.7 C) (Oral)   Resp 18   Ht 6\' 2"  (1.88 m)   Wt 211 lb 13.8 oz (96.1 kg)   SpO2 100%   BMI 27.20 kg/m   Physical Exam Vitals signs reviewed.  Cardiovascular:     Rate and Rhythm: Tachycardia present.  Pulmonary:     Effort: Pulmonary effort is normal.     Breath sounds: Normal breath sounds.  Abdominal:     Tenderness: There is no abdominal tenderness.  Musculoskeletal: Normal range of motion.     Comments: Left arm AVF in place  Skin:    General: Skin is warm and dry.  Neurological:     Mental Status: He is alert and oriented to person, place, and time.  Psychiatric:        Mood and Affect: Mood normal.        Behavior: Behavior normal.        Thought Content: Thought content normal.        Judgment: Judgment normal.     Imaging: Dg Chest Port 1 View  Result Date: 06/04/2019 CLINICAL DATA:  Catheter pulled out. EXAM: PORTABLE CHEST 1 VIEW COMPARISON:  Radiograph Apr 04, 2019. FINDINGS: The heart size and mediastinal contours are within normal limits. No pneumothorax or pleural effusion is  noted. Mild bibasilar subsegmental atelectasis is noted. The visualized skeletal structures are unremarkable. IMPRESSION: No active disease. Electronically Signed   By: Marijo Conception M.D.   On: 06/04/2019 13:36    Labs:  CBC: Recent Labs    04/04/19 0721 06/04/19 1258 06/04/19 1956 06/05/19 0606  WBC  --  3.0* 4.1 4.4  HGB 8.5* 6.4* 7.0* 7.3*  HCT 25.0* 20.2* 20.9* 21.8*  PLT  --  197 196 199    COAGS: Recent Labs    06/04/19 1258  INR 1.1    BMP: Recent Labs  03/30/19 0911 04/04/19 0721 06/04/19 1258 06/05/19 0606  NA 131* 131* 133* 136  K 3.4* 3.6 3.1* 3.7  CL  --   --  95* 94*  CO2  --   --  24 27  GLUCOSE 84 94 151* 144*  BUN  --   --  28* 34*  CALCIUM  --   --  8.5* 8.9  CREATININE  --   --  4.68* 5.37*  GFRNONAA  --   --  13* 11*  GFRAA  --   --  15* 12*    LIVER FUNCTION TESTS: Recent Labs    06/05/19 0606  ALBUMIN 2.0*    TUMOR MARKERS: No results for input(s): AFPTM, CEA, CA199, CHROMGRNA in the last 8760 hours.  Assessment and Plan:  ESRD Symptomatic anemia Left arm AVF in place-- not yet mature-- placed 04/04/19 Tunneled dialysis catheter inadvertently dislodged this weekend Now for new tunneled dialysis catheter placement Risks and benefits discussed with the patient including, but not limited to bleeding, infection, vascular injury, pneumothorax which may require chest tube placement, air embolism or even death  All of the patient's questions were answered, patient is agreeable to proceed. Consent signed and in chart.  Thank you for this interesting consult.  I greatly enjoyed meeting MARKON JARES Vang and look forward to participating in their care.  A copy of this report was sent to the requesting provider on this date.  Electronically Signed: Lavonia Drafts, PA-C 06/05/2019, 8:37 AM   I spent a total of 20 Minutes    in face to face in clinical consultation, greater than 50% of which was counseling/coordinating care for  tunneled dialysis catheter placement

## 2019-06-05 NOTE — Progress Notes (Signed)
Spoke with MD regarding Phosphorus level less than 1, she will call me back with further orders. Patient is stable at this time with no C/O pain.

## 2019-06-06 ENCOUNTER — Encounter (HOSPITAL_COMMUNITY): Payer: Self-pay | Admitting: Physician Assistant

## 2019-06-06 DIAGNOSIS — F1020 Alcohol dependence, uncomplicated: Secondary | ICD-10-CM | POA: Diagnosis present

## 2019-06-06 DIAGNOSIS — R710 Precipitous drop in hematocrit: Secondary | ICD-10-CM

## 2019-06-06 DIAGNOSIS — T829XXD Unspecified complication of cardiac and vascular prosthetic device, implant and graft, subsequent encounter: Secondary | ICD-10-CM

## 2019-06-06 HISTORY — DX: Alcohol dependence, uncomplicated: F10.20

## 2019-06-06 LAB — CBC
HCT: 19.5 % — ABNORMAL LOW (ref 39.0–52.0)
HCT: 22 % — ABNORMAL LOW (ref 39.0–52.0)
Hemoglobin: 6.5 g/dL — CL (ref 13.0–17.0)
Hemoglobin: 7.4 g/dL — ABNORMAL LOW (ref 13.0–17.0)
MCH: 31.4 pg (ref 26.0–34.0)
MCH: 30.7 pg (ref 26.0–34.0)
MCHC: 33.3 g/dL (ref 30.0–36.0)
MCHC: 33.6 g/dL (ref 30.0–36.0)
MCV: 94.2 fL (ref 80.0–100.0)
MCV: 91.3 fL (ref 80.0–100.0)
Platelets: 169 10*3/uL (ref 150–400)
Platelets: 175 10*3/uL (ref 150–400)
RBC: 2.07 MIL/uL — ABNORMAL LOW (ref 4.22–5.81)
RBC: 2.41 MIL/uL — ABNORMAL LOW (ref 4.22–5.81)
RDW: 18.1 % — ABNORMAL HIGH (ref 11.5–15.5)
RDW: 17.6 % — ABNORMAL HIGH (ref 11.5–15.5)
WBC: 4.8 10*3/uL (ref 4.0–10.5)
WBC: 5.1 10*3/uL (ref 4.0–10.5)
nRBC: 0 % (ref 0.0–0.2)
nRBC: 0 % (ref 0.0–0.2)

## 2019-06-06 LAB — BASIC METABOLIC PANEL
Anion gap: 13 (ref 5–15)
Anion gap: 12 (ref 5–15)
BUN: 17 mg/dL (ref 8–23)
BUN: 22 mg/dL (ref 8–23)
CO2: 26 mmol/L (ref 22–32)
CO2: 24 mmol/L (ref 22–32)
Calcium: 8.1 mg/dL — ABNORMAL LOW (ref 8.9–10.3)
Calcium: 8.4 mg/dL — ABNORMAL LOW (ref 8.9–10.3)
Chloride: 97 mmol/L — ABNORMAL LOW (ref 98–111)
Chloride: 94 mmol/L — ABNORMAL LOW (ref 98–111)
Creatinine, Ser: 3.3 mg/dL — ABNORMAL HIGH (ref 0.61–1.24)
Creatinine, Ser: 3.79 mg/dL — ABNORMAL HIGH (ref 0.61–1.24)
GFR calc Af Amer: 22 mL/min — ABNORMAL LOW (ref >60)
GFR calc Af Amer: 19 mL/min — ABNORMAL LOW (ref >60)
GFR calc non Af Amer: 19 mL/min — ABNORMAL LOW (ref >60)
GFR calc non Af Amer: 16 mL/min — ABNORMAL LOW (ref >60)
Glucose, Bld: 188 mg/dL — ABNORMAL HIGH (ref 70–99)
Glucose, Bld: 132 mg/dL — ABNORMAL HIGH (ref 70–99)
Potassium: 3.3 mmol/L — ABNORMAL LOW (ref 3.5–5.1)
Potassium: 3.2 mmol/L — ABNORMAL LOW (ref 3.5–5.1)
Sodium: 136 mmol/L (ref 135–145)
Sodium: 130 mmol/L — ABNORMAL LOW (ref 135–145)

## 2019-06-06 LAB — RENAL FUNCTION PANEL
Albumin: 1.8 g/dL — ABNORMAL LOW (ref 3.5–5.0)
Albumin: 1.9 g/dL — ABNORMAL LOW (ref 3.5–5.0)
Anion gap: 11 (ref 5–15)
Anion gap: 11 (ref 5–15)
BUN: 23 mg/dL (ref 8–23)
BUN: 24 mg/dL — ABNORMAL HIGH (ref 8–23)
CO2: 27 mmol/L (ref 22–32)
CO2: 28 mmol/L (ref 22–32)
Calcium: 8.7 mg/dL — ABNORMAL LOW (ref 8.9–10.3)
Calcium: 8.7 mg/dL — ABNORMAL LOW (ref 8.9–10.3)
Chloride: 96 mmol/L — ABNORMAL LOW (ref 98–111)
Chloride: 97 mmol/L — ABNORMAL LOW (ref 98–111)
Creatinine, Ser: 4.07 mg/dL — ABNORMAL HIGH (ref 0.61–1.24)
Creatinine, Ser: 4.3 mg/dL — ABNORMAL HIGH (ref 0.61–1.24)
GFR calc Af Amer: 17 mL/min — ABNORMAL LOW (ref >60)
GFR calc Af Amer: 16 mL/min — ABNORMAL LOW (ref >60)
GFR calc non Af Amer: 15 mL/min — ABNORMAL LOW (ref >60)
GFR calc non Af Amer: 14 mL/min — ABNORMAL LOW (ref >60)
Glucose, Bld: 114 mg/dL — ABNORMAL HIGH (ref 70–99)
Glucose, Bld: 117 mg/dL — ABNORMAL HIGH (ref 70–99)
Phosphorus: 2.4 mg/dL — ABNORMAL LOW (ref 2.5–4.6)
Phosphorus: 2.5 mg/dL (ref 2.5–4.6)
Potassium: 3.2 mmol/L — ABNORMAL LOW (ref 3.5–5.1)
Potassium: 3.2 mmol/L — ABNORMAL LOW (ref 3.5–5.1)
Sodium: 134 mmol/L — ABNORMAL LOW (ref 135–145)
Sodium: 136 mmol/L (ref 135–145)

## 2019-06-06 LAB — MAGNESIUM: Magnesium: 1.5 mg/dL — ABNORMAL LOW (ref 1.7–2.4)

## 2019-06-06 LAB — PHOSPHORUS: Phosphorus: 7.4 mg/dL — ABNORMAL HIGH (ref 2.5–4.6)

## 2019-06-06 LAB — GLUCOSE, CAPILLARY
Glucose-Capillary: 129 mg/dL — ABNORMAL HIGH (ref 70–99)
Glucose-Capillary: 201 mg/dL — ABNORMAL HIGH (ref 70–99)
Glucose-Capillary: 130 mg/dL — ABNORMAL HIGH (ref 70–99)
Glucose-Capillary: 107 mg/dL — ABNORMAL HIGH (ref 70–99)

## 2019-06-06 LAB — PARATHYROID HORMONE, INTACT (NO CA): PTH: 61 pg/mL (ref 15–65)

## 2019-06-06 LAB — OCCULT BLOOD X 1 CARD TO LAB, STOOL: Fecal Occult Bld: NEGATIVE

## 2019-06-06 LAB — HEPATITIS B SURFACE ANTIGEN: Hepatitis B Surface Ag: NEGATIVE

## 2019-06-06 MED ORDER — PANTOPRAZOLE SODIUM 40 MG IV SOLR
40.0000 mg | Freq: Two times a day (BID) | INTRAVENOUS | Status: DC
  Administered 2019-06-06 – 2019-06-07 (×2): 40 mg via INTRAVENOUS
  Filled 2019-06-06 (×2): qty 40

## 2019-06-06 MED ORDER — RENA-VITE PO TABS
1.0000 | ORAL_TABLET | Freq: Every day | ORAL | Status: DC
  Administered 2019-06-06: 23:00:00 1 via ORAL
  Filled 2019-06-06: qty 1

## 2019-06-06 MED ORDER — SODIUM PHOSPHATES 45 MMOLE/15ML IV SOLN
30.0000 mmol | Freq: Once | INTRAVENOUS | Status: AC
  Administered 2019-06-06: 30 mmol via INTRAVENOUS
  Filled 2019-06-06: qty 10

## 2019-06-06 MED ORDER — LORAZEPAM 1 MG PO TABS
1.0000 mg | ORAL_TABLET | Freq: Two times a day (BID) | ORAL | Status: DC
  Administered 2019-06-06 – 2019-06-07 (×3): 1 mg via ORAL
  Filled 2019-06-06 (×3): qty 1

## 2019-06-06 MED ORDER — CHLORHEXIDINE GLUCONATE CLOTH 2 % EX PADS
6.0000 | MEDICATED_PAD | Freq: Every day | CUTANEOUS | Status: DC
  Administered 2019-06-07: 6 via TOPICAL

## 2019-06-06 MED ORDER — MAGNESIUM SULFATE 2 GM/50ML IV SOLN
2.0000 g | Freq: Once | INTRAVENOUS | Status: AC
  Administered 2019-06-06: 16:00:00 2 g via INTRAVENOUS
  Filled 2019-06-06: qty 50

## 2019-06-06 MED ORDER — VITAMIN B-1 100 MG PO TABS
100.0000 mg | ORAL_TABLET | Freq: Every day | ORAL | Status: DC
  Administered 2019-06-06 – 2019-06-07 (×2): 100 mg via ORAL
  Filled 2019-06-06 (×2): qty 1

## 2019-06-06 MED ORDER — SODIUM CHLORIDE 0.9% IV SOLUTION: Freq: Once | INTRAVENOUS | Status: DC

## 2019-06-06 NOTE — Progress Notes (Addendum)
Family Medicine Teaching Service Daily Progress Note Intern Pager: [phone]  Patient name: Noah Vang Medical record number: [id number] Date of birth: 1956/06/09 Age: 63 y.o. Gender: male  Primary Care Provider: Clinic, [facility] Consultants: Radiology Code Status: Full  Pt Overview and Major Events to Date:  7/5 - admitted through ED  Assessment and Plan:  ESRD on dialysis - initial problem with vascular access - cath installed by radiology 7/6 - Nephrology following, appreciate recs - Dialysis normally M/W/F, completed 7/6 - monitor phos, mg, renal fx panel - heparin for DVT ppx  Symptomatic Macrocytic Anemia-  - f/u reticulocyte count - absolute - decreased at 13.7, immature retic fraction - wnl - f/u FOBT - Monitor Hbg - Hgb was 8.5 on admission, 7.3 on 7/6, 6.5 on 7/7- 1 unit packed rbcs ordered. - recommend colonoscopy outpatient to screen for colorectal cancer - Phos was low at <1.0 on morning labs, ordered redraw to confirm. Patient refused redraw. On afternoon of 7/6 redraw occurred, confirmed low phos, consulted nephrology, phos replaced. - Phos 7/7 - 7.4 - spoke with nephrology who advised to redraw labs to confirm and they would treat as necessary. Appreciate nephro assistance/recommendations.  Tachycardia (SVT vs Afib)-Home med: metoprolol 25 qday.  - Metoprolol 12.5mg  BID  - TSH wnl - 2.520 - continuous Telemetry - Continue to monitor vitals   DM- with hx of insulin that was discontinued for hypoglycemic episodes hx of insulin use, discontinued for multiple hypoglycemic episodes.  - sSSI - CBG monitoring   Hypophosphatemia: - Phos under 1.0 7/6, was confirmed, renal consulted and phos replaced - Phos level 7.4 this morning (7/7) - nephro aware and following - PTH wnl  Qtc prolongation-  - 540 on initial ECG w/ HR 133 - 497 on repeat with HR 93 - 482 on 7/6 a.m. - continue to monitor  Chronic knee pain-  - Acetaminophen prn  -  topical Voltaren PRN  FEN/GI: Renal PPx:  Heparin  Disposition: Pending medical workup   Subjective:  Patient had a phos level of <1.0 yesterday, redraw was ordered but patient refused. Redraw occurred later in the day and result was confirmed. Nephro was consulted and recheck was ordered after patient's sodium phosphate infusion was complete. Phos still low on recheck, phos ordered. Morning phos today 7.4. Discussed with nephro who advised redraw labs to confirm given the wide swing in levels since yesterday. Patient otherwise without complaints.  Objective: Temp:  [97.8 F (36.6 C)-99.4 F (37.4 C)] 98.7 F (37.1 C) (07/07 0512) Pulse Rate:  [90-126] 97 (07/07 0512) Resp:  [16-28] 18 (07/07 0512) BP: (129-185)/(70-103) 137/76 (07/07 0512) SpO2:  [99 %-100 %] 100 % (07/07 0512) Weight:  [92.3 kg-95.7 kg] 94.2 kg (07/07 0512) Physical Exam: General: Alert and oriented in no apparent distress Heart: Regular rate and rhythm with no murmurs appreciated Lungs: CTA bilaterally, no wheezing Abdomen: Bowel sounds present, no abdominal pain Skin: Warm and dry Extremities: No lower extremity edema   Laboratory: Recent Labs  Lab 06/04/19 1956 06/05/19 0606 06/06/19 0410  WBC 4.1 4.4 4.8  HGB 7.0* 7.3* 6.5*  HCT 20.9* 21.8* 19.5*  PLT 196 199 169   Recent Labs  Lab 06/05/19 0606 06/05/19 1505 06/06/19 0410  NA 136 136 136  K 3.7 2.9* 3.3*  CL 94* 84* 97*  CO2 27 24 26   BUN 34* 33* 17  CREATININE 5.37* 5.30* 3.30*  CALCIUM 8.9 7.9* 8.1*  GLUCOSE 144* 482* 188*  Imaging/Diagnostic Tests: Ir Fluoro Guide Cv Line Right  Result Date: 06/05/2019 INDICATION: 63 year old with end-stage renal disease and needs new dialysis access. EXAM: FLUOROSCOPIC AND ULTRASOUND GUIDED PLACEMENT OF A TUNNELED DIALYSIS CATHETER Physician: Stephan Minister. Anselm Pancoast, MD MEDICATIONS: Ancef 2 g; The antibiotic was administered within an appropriate time interval prior to skin puncture. ANESTHESIA/SEDATION:  Versed 1.0 mg IV; Fentanyl 75 mcg IV; Moderate Sedation Time:  25 minutes The patient was continuously monitored during the procedure by the interventional radiology nurse under my direct supervision. FLUOROSCOPY TIME:  Fluoroscopy Time: 1 minute, 3.2 mGy COMPLICATIONS: None immediate. PROCEDURE: Informed consent was obtained for placement of a tunneled dialysis catheter. The patient was placed supine on the interventional table. Ultrasound confirmed a patent right internal jugular vein. Ultrasound images were obtained for documentation. The right side of the neck and chest was prepped and draped in a sterile fashion. The right neck was anesthetized with 1% lidocaine. Maximal barrier sterile technique was utilized including caps, mask, sterile gowns, sterile gloves, sterile drape, hand hygiene and skin antiseptic. A small incision was made with #11 blade scalpel. A 21 gauge needle directed into the right internal jugular vein with ultrasound guidance. A micropuncture dilator set was placed. A 19 cm tip to cuff Palindrome catheter was selected. The skin below the right clavicle was anesthetized and a small incision was made with an #11 blade scalpel. A subcutaneous tunnel was formed to the vein dermatotomy site. The catheter was brought through the tunnel. The vein dermatotomy site was dilated to accommodate a peel-away sheath. The catheter was placed through the peel-away sheath and directed into the central venous structures. The tip of the catheter was placed at the SVC and right atrium junction with fluoroscopy. Fluoroscopic images were obtained for documentation. Both lumens were found to aspirate and flush well. The proper amount of heparin was flushed in both lumens. The vein dermatotomy site was closed using a single layer of absorbable suture and Dermabond. Gel-Foam was placed within the subcutaneous tract for hemostasis. The catheter was secured to the skin using Prolene suture. IMPRESSION: Successful  placement of a right jugular tunneled dialysis catheter using ultrasound and fluoroscopic guidance. Electronically Signed   By: Markus Daft M.D.   On: 06/05/2019 18:07   Ir US Guide Vasc Access Right  Result Date: 06/05/2019 INDICATION: 63 year old with end-stage renal disease and needs new dialysis access. EXAM: FLUOROSCOPIC AND ULTRASOUND GUIDED PLACEMENT OF A TUNNELED DIALYSIS CATHETER Physician: Stephan Minister. Anselm Pancoast, MD MEDICATIONS: Ancef 2 g; The antibiotic was administered within an appropriate time interval prior to skin puncture. ANESTHESIA/SEDATION: Versed 1.0 mg IV; Fentanyl 75 mcg IV; Moderate Sedation Time:  25 minutes The patient was continuously monitored during the procedure by the interventional radiology nurse under my direct supervision. FLUOROSCOPY TIME:  Fluoroscopy Time: 1 minute, 3.2 mGy COMPLICATIONS: None immediate. PROCEDURE: Informed consent was obtained for placement of a tunneled dialysis catheter. The patient was placed supine on the interventional table. Ultrasound confirmed a patent right internal jugular vein. Ultrasound images were obtained for documentation. The right side of the neck and chest was prepped and draped in a sterile fashion. The right neck was anesthetized with 1% lidocaine. Maximal barrier sterile technique was utilized including caps, mask, sterile gowns, sterile gloves, sterile drape, hand hygiene and skin antiseptic. A small incision was made with #11 blade scalpel. A 21 gauge needle directed into the right internal jugular vein with ultrasound guidance. A micropuncture dilator set was placed. A 19 cm tip to cuff  Palindrome catheter was selected. The skin below the right clavicle was anesthetized and a small incision was made with an #11 blade scalpel. A subcutaneous tunnel was formed to the vein dermatotomy site. The catheter was brought through the tunnel. The vein dermatotomy site was dilated to accommodate a peel-away sheath. The catheter was placed through the  peel-away sheath and directed into the central venous structures. The tip of the catheter was placed at the SVC and right atrium junction with fluoroscopy. Fluoroscopic images were obtained for documentation. Both lumens were found to aspirate and flush well. The proper amount of heparin was flushed in both lumens. The vein dermatotomy site was closed using a single layer of absorbable suture and Dermabond. Gel-Foam was placed within the subcutaneous tract for hemostasis. The catheter was secured to the skin using Prolene suture. IMPRESSION: Successful placement of a right jugular tunneled dialysis catheter using ultrasound and fluoroscopic guidance. Electronically Signed   By: Markus Daft M.D.   On: 06/05/2019 18:07   Dg Chest Port 1 View  Result Date: 06/04/2019 CLINICAL DATA:  Catheter pulled out. EXAM: PORTABLE CHEST 1 VIEW COMPARISON:  Radiograph Apr 04, 2019. FINDINGS: The heart size and mediastinal contours are within normal limits. No pneumothorax or pleural effusion is noted. Mild bibasilar subsegmental atelectasis is noted. The visualized skeletal structures are unremarkable. IMPRESSION: No active disease. Electronically Signed   By: Marijo Conception M.D.   On: 06/04/2019 13:36    Lurline Del, MD 06/06/2019, 5:29 AM PGY-1, [place] Intern pager: [phone], text pages welcome

## 2019-06-06 NOTE — Plan of Care (Signed)
  Problem: Pain Managment: Goal: General experience of comfort will improve Outcome: Progressing   Problem: Safety: Goal: Ability to remain free from injury will improve 06/06/2019 0150 by Emelia Salisbury, RN Outcome: Progressing 06/06/2019 0148 by Emelia Salisbury, RN Outcome: Progressing   Problem: Skin Integrity: Goal: Risk for impaired skin integrity will decrease Outcome: Progressing   Problem: Fluid Volume: Goal: Compliance with measures to maintain balanced fluid volume will improve Outcome: Progressing

## 2019-06-06 NOTE — Discharge Summary (Signed)
[facility] Discharge Summary  Patient name: Noah Vang Medical record number: [id number] Date of birth: [birth year] Age: 63 y.o. Gender: male Date of Admission: 06/04/2019  Date of Discharge: 06/07/2019 Admitting Physician: Blane Ohara McDiarmid, MD  Primary Care Provider: [facility] Consultants: Nephrology,   Indication for Hospitalization: Lack of HD Access  Discharge Diagnoses/Problem List:  ESRD on dialysis Macrocytic anemia Alcohol use disorder HTN  Disposition: Home  Discharge Condition: Stable  Discharge Exam:  General: Alert and oriented in no apparent distress Heart: RRR with no murmurs appreciated Lungs: CTA bilaterally, no wheezing Abdomen: Bowel sounds present, no abdominal pain Skin: Warm and dry Extremities: No lower extremity edema  Brief Hospital Course:  Patient presented to the ED having accidentally pulled his HD cather out in his sleep. He did not have any bleeding from the cath site and otherwise felt well. He had not missed any dialysis sessions when this occurred. In the ED it was found he had tachycardia with heart rates in the 120s-130s on metoprolol 25qday. He was also determined to be anemic with initial hgb on admission being 6.4. EKG showed 133hr with normal axis and question of SVT vs sinus tachycardia. It was found that the patient had a history of atrial fibrillation. In the ED he got one unit of packed red blood cells. He was admitted and his HD cath was reinstalled. He was able to receive dialysis on his normally scheduled day  (7/6) while in the hospital. On morning of 7/6 he was found to have a phos of <1.0, a redraw was ordered to confirm but the patient refused. Phos was finally confirmed to be low at 1500hrs on 7/6 and patient's phos was replaced. It continued to be low at 1.9. 2230hrs on 7/6 his phos was found to be back <1.0 and more phos was replaced. On recheck labs the next morning showed his phos to  be 7.4. Nephrology was called and followed the patient. Recheck of labs on 7/8 showed phos of 2.7. Patient required a second unit of rbcs on 7/7 due to a hemoglobin of 6.5 and was discharged with a hemoglobin of 8.1. Patient received his normally scheduled dialysis on day of discharge.   Issues for Follow Up:  1. GI outpatient for colonoscopy 2. GI f/u for persistent anemia 3. PCP f/u for tachycardia  Significant Procedures:  - 7/6 - HD cath reinstalled - 7/6 - Hemodialysis -7/8 - Hemodialysis   Significant Labs and Imaging:  Recent Labs  Lab 06/06/19 2304 06/07/19 0727 06/07/19 1135  WBC 5.1 4.7 4.6  HGB 7.4* 7.2* 8.1*  HCT 22.0* 21.2* 23.6*  PLT 175 171 197   Recent Labs  Lab 06/05/19 0606 06/05/19 1505  06/05/19 2228 06/06/19 0410 06/06/19 1654 06/06/19 1953 06/06/19 2304 06/07/19 0727 06/07/19 1135  NA 136 136  --   --  136 130* 134* 136 136 136  K 3.7 2.9*  --   --  3.3* 3.2* 3.2* 3.2* 3.2* 3.6  CL 94* 84*  --   --  97* 94* 96* 97* 98 99  CO2 27 24  --   --  26 24 27 28 28 28   GLUCOSE 144* 482*  --   --  188* 132* 114* 117* 102* 125*  BUN 34* 33*  --   --  17 22 23  24* 27* 10  CREATININE 5.37* 5.30*  --   --  3.30* 3.79* 4.07* 4.30* 4.61* 2.35*  CALCIUM 8.9  7.9*  --   --  8.1* 8.4* 8.7* 8.7* 8.8* 8.3*  MG  --   --   --   --  1.5*  --   --   --   --   --   PHOS <1.0* <1.0*   < > <1.0* 7.4*  --  2.4* 2.5 2.7  --   ALBUMIN 2.0* 1.7*  --   --   --   --  1.8* 1.9* 1.8*  --    < > = values in this interval not displayed.      Results/Tests Pending at Time of Discharge:   Discharge Medications:  Allergies as of 06/07/2019      Reactions   Benadryl [diphenhydramine] Other (See Comments)   hallucinations   Vancomycin Rash, Other (See Comments)   hallucinations      Medication List    TAKE these medications   acetaminophen 500 MG tablet Commonly known as: TYLENOL Take 500 mg by mouth every 6 (six) hours as needed for mild pain.   ferrous sulfate 325 (65  FE) MG tablet Take 1 tablet (325 mg total) by mouth daily with breakfast. Start taking on: June 08, 2019   metoprolol succinate 25 MG 24 hr tablet Commonly known as: TOPROL-XL Take 25 mg by mouth at bedtime.   pantoprazole 40 MG tablet Commonly known as: Protonix Take 1 tablet (40 mg total) by mouth daily.       Discharge Instructions: Please refer to Patient Instructions section of EMR for full details.  Patient was counseled important signs and symptoms that should prompt return to medical care, changes in medications, dietary instructions, activity restrictions, and follow up appointments.   Follow-Up Appointments:   Lurline Del, MD 06/07/2019, 4:04 PM PGY-1, [place]

## 2019-06-06 NOTE — Progress Notes (Signed)
CRITICAL VALUE ALERT  Critical Value: Hgb 6.5  Date & Time Notied:  06/06/19 5:25 am  Provider Notified: MD Carollee Leitz  Orders Received/Actions taken: Administer 1 unit of PRBC

## 2019-06-06 NOTE — Progress Notes (Signed)
Spoke to GI regarding patient's anemia. GI contacted Noah Vang but he could only consent to a colonoscopy if it could be done tomorrow, which unfortunately was not possible due to a full schedule. GI recommended PPI and says patient stated he is following up with the [facility] next weekend prefers to have his anemia worked up there. Appreciate GI assistance and recommendations.

## 2019-06-06 NOTE — Progress Notes (Addendum)
[facility] Progress Note   Subjective: Upset and anxious, wanting to go home. Patient is daily drinker, suspect he may be having early signs of withdrawal. He acknowledges he has had issues with withdrawal before, but now agrees to stay until all work up needed is done.   Objective Vitals:   06/05/19 2351 06/06/19 0512 06/06/19 0801 06/06/19 0809  BP: 130/73 137/76 134/79 134/79  Pulse: (!) 102 97 88 86  Resp: 17 18  19   Temp: 99.4 F (37.4 C) 98.7 F (37.1 C)  98.8 F (37.1 C)  TempSrc: Oral Oral  Oral  SpO2: 100% 100%  98%  Weight:  94.2 kg    Height:       Physical Exam General: Chronically ill appearing male in NAD Heart:S1,S2, RRR Lungs: CTAB A/P Abdomen: Active BS Extremities: Trace BLE edema. Dialysis Access: Maturing LUA AVF + bruit new RIJ TDC exchanged 06/05/2019   Additional Objective Labs: Basic Metabolic Panel: Recent Labs  Lab 06/05/19 0606 06/05/19 1505 06/05/19 1740 06/05/19 2228 06/06/19 0410  NA 136 136  --   --  136  K 3.7 2.9*  --   --  3.3*  CL 94* 84*  --   --  97*  CO2 27 24  --   --  26  GLUCOSE 144* 482*  --   --  188*  BUN 34* 33*  --   --  17  CREATININE 5.37* 5.30*  --   --  3.30*  CALCIUM 8.9 7.9*  --   --  8.1*  PHOS <1.0* <1.0* 1.9* <1.0* 7.4*   Liver Function Tests: Recent Labs  Lab 06/05/19 0606 06/05/19 1505  ALBUMIN 2.0* 1.7*   No results for input(s): LIPASE, AMYLASE in the last 168 hours. CBC: Recent Labs  Lab 06/04/19 1258 06/04/19 1956 06/05/19 0606 06/06/19 0410  WBC 3.0* 4.1 4.4 4.8  NEUTROABS 2.1  --   --   --   HGB 6.4* 7.0* 7.3* 6.5*  HCT 20.2* 20.9* 21.8* 19.5*  MCV 101.0* 95.0 95.2 94.2  PLT 197 196 199 169   Blood Culture    Component Value Date/Time   SDES URINE, RANDOM 10/01/2014 0316   SPECREQUEST NONE 10/01/2014 0316   CULT  10/01/2014 0316    INSIGNIFICANT GROWTH Performed at [facility] 10/02/2014 FINAL 10/01/2014 0316    Cardiac Enzymes: No  results for input(s): CKTOTAL, CKMB, CKMBINDEX, TROPONINI in the last 168 hours. CBG: Recent Labs  Lab 06/04/19 2044 06/05/19 0835 06/05/19 1210 06/05/19 2217 06/06/19 0727  GLUCAP 175* 146* 113* 91 129*   Iron Studies: No results for input(s): IRON, TIBC, TRANSFERRIN, FERRITIN in the last 72 hours. @lablastinr3 @ Studies/Results: Ir Fluoro Guide Cv Line Right  Result Date: 06/05/2019 INDICATION: 63 year old with end-stage renal disease and needs new dialysis access. EXAM: FLUOROSCOPIC AND ULTRASOUND GUIDED PLACEMENT OF A TUNNELED DIALYSIS CATHETER Physician: Stephan Minister. Anselm Pancoast, MD MEDICATIONS: Ancef 2 g; The antibiotic was administered within an appropriate time interval prior to skin puncture. ANESTHESIA/SEDATION: Versed 1.0 mg IV; Fentanyl 75 mcg IV; Moderate Sedation Time:  25 minutes The patient was continuously monitored during the procedure by the interventional radiology nurse under my direct supervision. FLUOROSCOPY TIME:  Fluoroscopy Time: 1 minute, 3.2 mGy COMPLICATIONS: None immediate. PROCEDURE: Informed consent was obtained for placement of a tunneled dialysis catheter. The patient was placed supine on the interventional table. Ultrasound confirmed a patent right internal jugular vein. Ultrasound images were obtained for documentation. The  right side of the neck and chest was prepped and draped in a sterile fashion. The right neck was anesthetized with 1% lidocaine. Maximal barrier sterile technique was utilized including caps, mask, sterile gowns, sterile gloves, sterile drape, hand hygiene and skin antiseptic. A small incision was made with #11 blade scalpel. A 21 gauge needle directed into the right internal jugular vein with ultrasound guidance. A micropuncture dilator set was placed. A 19 cm tip to cuff Palindrome catheter was selected. The skin below the right clavicle was anesthetized and a small incision was made with an #11 blade scalpel. A subcutaneous tunnel was formed to the vein  dermatotomy site. The catheter was brought through the tunnel. The vein dermatotomy site was dilated to accommodate a peel-away sheath. The catheter was placed through the peel-away sheath and directed into the central venous structures. The tip of the catheter was placed at the SVC and right atrium junction with fluoroscopy. Fluoroscopic images were obtained for documentation. Both lumens were found to aspirate and flush well. The proper amount of heparin was flushed in both lumens. The vein dermatotomy site was closed using a single layer of absorbable suture and Dermabond. Gel-Foam was placed within the subcutaneous tract for hemostasis. The catheter was secured to the skin using Prolene suture. IMPRESSION: Successful placement of a right jugular tunneled dialysis catheter using ultrasound and fluoroscopic guidance. Electronically Signed   By: Markus Daft M.D.   On: 06/05/2019 18:07   Ir US Guide Vasc Access Right  Result Date: 06/05/2019 INDICATION: 63 year old with end-stage renal disease and needs new dialysis access. EXAM: FLUOROSCOPIC AND ULTRASOUND GUIDED PLACEMENT OF A TUNNELED DIALYSIS CATHETER Physician: Stephan Minister. Anselm Pancoast, MD MEDICATIONS: Ancef 2 g; The antibiotic was administered within an appropriate time interval prior to skin puncture. ANESTHESIA/SEDATION: Versed 1.0 mg IV; Fentanyl 75 mcg IV; Moderate Sedation Time:  25 minutes The patient was continuously monitored during the procedure by the interventional radiology nurse under my direct supervision. FLUOROSCOPY TIME:  Fluoroscopy Time: 1 minute, 3.2 mGy COMPLICATIONS: None immediate. PROCEDURE: Informed consent was obtained for placement of a tunneled dialysis catheter. The patient was placed supine on the interventional table. Ultrasound confirmed a patent right internal jugular vein. Ultrasound images were obtained for documentation. The right side of the neck and chest was prepped and draped in a sterile fashion. The right neck was anesthetized  with 1% lidocaine. Maximal barrier sterile technique was utilized including caps, mask, sterile gowns, sterile gloves, sterile drape, hand hygiene and skin antiseptic. A small incision was made with #11 blade scalpel. A 21 gauge needle directed into the right internal jugular vein with ultrasound guidance. A micropuncture dilator set was placed. A 19 cm tip to cuff Palindrome catheter was selected. The skin below the right clavicle was anesthetized and a small incision was made with an #11 blade scalpel. A subcutaneous tunnel was formed to the vein dermatotomy site. The catheter was brought through the tunnel. The vein dermatotomy site was dilated to accommodate a peel-away sheath. The catheter was placed through the peel-away sheath and directed into the central venous structures. The tip of the catheter was placed at the SVC and right atrium junction with fluoroscopy. Fluoroscopic images were obtained for documentation. Both lumens were found to aspirate and flush well. The proper amount of heparin was flushed in both lumens. The vein dermatotomy site was closed using a single layer of absorbable suture and Dermabond. Gel-Foam was placed within the subcutaneous tract for hemostasis. The catheter was secured to  the skin using Prolene suture. IMPRESSION: Successful placement of a right jugular tunneled dialysis catheter using ultrasound and fluoroscopic guidance. Electronically Signed   By: Markus Daft M.D.   On: 06/05/2019 18:07   Dg Chest Port 1 View  Result Date: 06/04/2019 CLINICAL DATA:  Catheter pulled out. EXAM: PORTABLE CHEST 1 VIEW COMPARISON:  Radiograph Apr 04, 2019. FINDINGS: The heart size and mediastinal contours are within normal limits. No pneumothorax or pleural effusion is noted. Mild bibasilar subsegmental atelectasis is noted. The visualized skeletal structures are unremarkable. IMPRESSION: No active disease. Electronically Signed   By: Marijo Conception M.D.   On: 06/04/2019 13:36    Medications:  . sodium chloride   Intravenous Once  . Chlorhexidine Gluconate Cloth  6 each Topical Q0600  . diclofenac sodium  2 g Topical QID  . heparin  5,000 Units Subcutaneous Q8H  . insulin aspart  0-5 Units Subcutaneous QHS  . insulin aspart  0-9 Units Subcutaneous TID WC  . LORazepam  1 mg Oral BID  . metoprolol tartrate  5 mg Intravenous Once  . metoprolol tartrate  12.5 mg Oral BID     Dialysis: MWF [facility] 4h  450/800  98kg  3K/2.25  LUAVF maturing (04/04/2019 by Dr. Donzetta Matters) Heparin 3400 unit bolus Mircera251mcg q2wks - last 6/29  Last Labs:05/31/2019:Hgb 7.5, TSATcannot be calculated, K2.6, Ca6.4, P<1, Alb2.8,PTH149 on 6/17  Assessment/Plan: 1. Dislodged TDC/ need for HD access: Resolved, new TDC placed in IR yesterday. Appreciate IR's assistance.  2. Symptomatic anemia, AOCA CKD - Hgb 6.4  06/05/19 rec'd 1 unit of PRBCs, HGB 6.5. Concern for active GIB. Discussed with primary. To receive 1 unit PRBCS today. Follow HGB>  3. ESRD - On HD MWF. Next HD 06/07/19 K+ 3.3 Use 4.0 K bath, no heparin.  4. Hypertension/volume - Blood pressure well controlled. Does not appear volume overloaded. Likely losing weight, has been under EDW. Titrate down as tolerated. May need lower dry on d/c. 5. Secondary Hyperparathyroidism - Ca 81. OP phos <1. Rec'd 30 MMol sodium phos 07/06. Phos now 7.4. Doubt this is correct but refused repeat lab draw. Check with HD tomorrow.Not on VDRA or binders. Post wt yesterday 94.2 kg.  7.  ETOH abuse. Getting into window where withdrawal may be an issue. Anxious and tearful while talking. Says he hasn't wanted a drink that bad. Ativan 1 mg PO BID for now. Discussed with primary. May need CIWA protocol.  8.  Nutrition-low phos and low K+ change to regular diet with fld restrictions. Add prostat, renal vits. Thiamine.  9. Tachycardia-has been as high as 120s in HD unit. On BB.   Rita H. Brown NP-C 06/06/2019, 10:17 AM  [facility] [phone]  Pt seen, examined and agree w A/P as above.  Kelly Splinter  MD 06/06/2019, 2:39 PM

## 2019-06-07 LAB — RENAL FUNCTION PANEL
Albumin: 1.8 g/dL — ABNORMAL LOW (ref 3.5–5.0)
Anion gap: 10 (ref 5–15)
BUN: 27 mg/dL — ABNORMAL HIGH (ref 8–23)
CO2: 28 mmol/L (ref 22–32)
Calcium: 8.8 mg/dL — ABNORMAL LOW (ref 8.9–10.3)
Chloride: 98 mmol/L (ref 98–111)
Creatinine, Ser: 4.61 mg/dL — ABNORMAL HIGH (ref 0.61–1.24)
GFR calc Af Amer: 15 mL/min — ABNORMAL LOW (ref >60)
GFR calc non Af Amer: 13 mL/min — ABNORMAL LOW (ref >60)
Glucose, Bld: 102 mg/dL — ABNORMAL HIGH (ref 70–99)
Phosphorus: 2.7 mg/dL (ref 2.5–4.6)
Potassium: 3.2 mmol/L — ABNORMAL LOW (ref 3.5–5.1)
Sodium: 136 mmol/L (ref 135–145)

## 2019-06-07 LAB — BASIC METABOLIC PANEL
Anion gap: 9 (ref 5–15)
BUN: 10 mg/dL (ref 8–23)
CO2: 28 mmol/L (ref 22–32)
Calcium: 8.3 mg/dL — ABNORMAL LOW (ref 8.9–10.3)
Chloride: 99 mmol/L (ref 98–111)
Creatinine, Ser: 2.35 mg/dL — ABNORMAL HIGH (ref 0.61–1.24)
GFR calc Af Amer: 33 mL/min — ABNORMAL LOW (ref >60)
GFR calc non Af Amer: 29 mL/min — ABNORMAL LOW (ref >60)
Glucose, Bld: 125 mg/dL — ABNORMAL HIGH (ref 70–99)
Potassium: 3.6 mmol/L (ref 3.5–5.1)
Sodium: 136 mmol/L (ref 135–145)

## 2019-06-07 LAB — TYPE AND SCREEN
ABO/RH(D): A POS
Antibody Screen: NEGATIVE
Unit division: 0
Unit division: 0

## 2019-06-07 LAB — CBC
HCT: 21.2 % — ABNORMAL LOW (ref 39.0–52.0)
HCT: 23.6 % — ABNORMAL LOW (ref 39.0–52.0)
Hemoglobin: 7.2 g/dL — ABNORMAL LOW (ref 13.0–17.0)
Hemoglobin: 8.1 g/dL — ABNORMAL LOW (ref 13.0–17.0)
MCH: 31.6 pg (ref 26.0–34.0)
MCH: 31.5 pg (ref 26.0–34.0)
MCHC: 34 g/dL (ref 30.0–36.0)
MCHC: 34.3 g/dL (ref 30.0–36.0)
MCV: 93 fL (ref 80.0–100.0)
MCV: 91.8 fL (ref 80.0–100.0)
Platelets: 171 10*3/uL (ref 150–400)
Platelets: 197 10*3/uL (ref 150–400)
RBC: 2.28 MIL/uL — ABNORMAL LOW (ref 4.22–5.81)
RBC: 2.57 MIL/uL — ABNORMAL LOW (ref 4.22–5.81)
RDW: 17.7 % — ABNORMAL HIGH (ref 11.5–15.5)
RDW: 17.5 % — ABNORMAL HIGH (ref 11.5–15.5)
WBC: 4.7 10*3/uL (ref 4.0–10.5)
WBC: 4.6 10*3/uL (ref 4.0–10.5)
nRBC: 0 % (ref 0.0–0.2)
nRBC: 0 % (ref 0.0–0.2)

## 2019-06-07 LAB — BPAM RBC
Blood Product Expiration Date: 202007112359
Blood Product Expiration Date: 202007312359
ISSUE DATE / TIME: 202007051432
ISSUE DATE / TIME: 202007071047
Unit Type and Rh: 6200
Unit Type and Rh: 6200

## 2019-06-07 LAB — GLUCOSE, CAPILLARY
Glucose-Capillary: 103 mg/dL — ABNORMAL HIGH (ref 70–99)
Glucose-Capillary: 129 mg/dL — ABNORMAL HIGH (ref 70–99)

## 2019-06-07 LAB — VITAMIN B12: Vitamin B-12: 943 pg/mL — ABNORMAL HIGH (ref 180–914)

## 2019-06-07 LAB — FOLATE: Folate: 3.1 ng/mL — ABNORMAL LOW (ref >5.9)

## 2019-06-07 MED ORDER — SODIUM CHLORIDE 0.9 % IV SOLN: 100.0000 mL | INTRAVENOUS | Status: DC | PRN

## 2019-06-07 MED ORDER — PANTOPRAZOLE SODIUM 40 MG PO TBEC: 40.0000 mg | DELAYED_RELEASE_TABLET | Freq: Every day | ORAL | 0 refills | Status: DC

## 2019-06-07 MED ORDER — FERROUS SULFATE 325 (65 FE) MG PO TABS
325.0000 mg | ORAL_TABLET | Freq: Every day | ORAL | Status: DC
  Administered 2019-06-07: 325 mg via ORAL
  Filled 2019-06-07: qty 1

## 2019-06-07 MED ORDER — FERROUS SULFATE 325 (65 FE) MG PO TABS: 325.0000 mg | ORAL_TABLET | Freq: Every day | ORAL | 0 refills | Status: AC

## 2019-06-07 MED ORDER — HEPARIN SODIUM (PORCINE) 1000 UNIT/ML IJ SOLN
INTRAMUSCULAR | Status: AC
  Filled 2019-06-07: qty 4

## 2019-06-07 MED ORDER — ALTEPLASE 2 MG IJ SOLR: 2.0000 mg | Freq: Once | INTRAMUSCULAR | Status: DC | PRN

## 2019-06-07 MED ORDER — LIDOCAINE-PRILOCAINE 2.5-2.5 % EX CREA
1.0000 "application " | TOPICAL_CREAM | CUTANEOUS | Status: DC | PRN
  Filled 2019-06-07: qty 5

## 2019-06-07 MED ORDER — LIDOCAINE HCL (PF) 1 % IJ SOLN: 5.0000 mL | INTRAMUSCULAR | Status: DC | PRN

## 2019-06-07 MED ORDER — DARBEPOETIN ALFA 200 MCG/0.4ML IJ SOSY
200.0000 ug | PREFILLED_SYRINGE | INTRAMUSCULAR | Status: DC
  Administered 2019-06-07: 200 ug via INTRAVENOUS

## 2019-06-07 MED ORDER — HEPARIN SODIUM (PORCINE) 1000 UNIT/ML DIALYSIS
1000.0000 [IU] | INTRAMUSCULAR | Status: DC | PRN
  Administered 2019-06-07: 1000 [IU] via INTRAVENOUS_CENTRAL

## 2019-06-07 MED ORDER — DARBEPOETIN ALFA 200 MCG/0.4ML IJ SOSY
PREFILLED_SYRINGE | INTRAMUSCULAR | Status: AC
  Filled 2019-06-07: qty 0.4

## 2019-06-07 MED ORDER — PENTAFLUOROPROP-TETRAFLUOROETH EX AERO: 1.0000 "application " | INHALATION_SPRAY | CUTANEOUS | Status: DC | PRN

## 2019-06-07 NOTE — Discharge Instructions (Signed)
Discharge Instructions:  Anemia  Anemia is a condition in which you do not have enough red blood cells or hemoglobin. Hemoglobin is a substance in red blood cells that carries oxygen. When you do not have enough red blood cells or hemoglobin (are anemic), your body cannot get enough oxygen and your organs may not work properly. As a result, you may feel very tired or have other problems. What are the causes? Common causes of anemia include:  Excessive bleeding. Anemia can be caused by excessive bleeding inside or outside the body, including bleeding from the intestine or from periods in women.  Poor nutrition.  Long-lasting (chronic) kidney, thyroid, and liver disease.  Bone marrow disorders.  Cancer and treatments for cancer.  HIV (human immunodeficiency virus) and AIDS (acquired immunodeficiency syndrome).  Treatments for HIV and AIDS.  Spleen problems.  Blood disorders.  Infections, medicines, and autoimmune disorders that destroy red blood cells. What are the signs or symptoms? Symptoms of this condition include:  Minor weakness.  Dizziness.  Headache.  Feeling heartbeats that are irregular or faster than normal (palpitations).  Shortness of breath, especially with exercise.  Paleness.  Cold sensitivity.  Indigestion.  Nausea.  Difficulty sleeping.  Difficulty concentrating. Symptoms may occur suddenly or develop slowly. If your anemia is mild, you may not have symptoms. How is this diagnosed? This condition is diagnosed based on:  Blood tests.  Your medical history.  A physical exam.  Bone marrow biopsy. Your health care provider may also check your stool (feces) for blood and may do additional testing to look for the cause of your bleeding. You may also have other tests, including:  Imaging tests, such as a CT scan or MRI.  Endoscopy.  Colonoscopy. How is this treated? Treatment for this condition depends on the cause. If you continue to  lose a lot of blood, you may need to be treated at a hospital. Treatment may include:  Taking supplements of iron, vitamin X38, or folic acid.  Taking a hormone medicine (erythropoietin) that can help to stimulate red blood cell growth.  Having a blood transfusion. This may be needed if you lose a lot of blood.  Making changes to your diet.  Having surgery to remove your spleen. Follow these instructions at home:  Take over-the-counter and prescription medicines only as told by your health care provider.  Take supplements only as told by your health care provider.  Follow any diet instructions that you were given.  Keep all follow-up visits as told by your health care provider. This is important. Contact a health care provider if:  You develop new bleeding anywhere in the body. Get help right away if:  You are very weak.  You are short of breath.  You have pain in your abdomen or chest.  You are dizzy or feel faint.  You have trouble concentrating.  You have bloody or black, tarry stools.  You vomit repeatedly or you vomit up blood. Summary  Anemia is a condition in which you do not have enough red blood cells or enough of a substance in your red blood cells that carries oxygen (hemoglobin).  Symptoms may occur suddenly or develop slowly.  If your anemia is mild, you may not have symptoms.  This condition is diagnosed with blood tests as well as a medical history and physical exam. Other tests may be needed.  Treatment for this condition depends on the cause of the anemia. This information is not intended to replace advice given  to you by your health care provider. Make sure you discuss any questions you have with your health care provider. Document Released: 12/24/2004 Document Revised: 10/29/2017 Document Reviewed: 12/18/2016 Elsevier Patient Education  2020 [employer].

## 2019-06-07 NOTE — Progress Notes (Addendum)
[facility] Progress Note   Subjective: Seen on HD. Feels better today. Promises he will follow with GI at [facility] clinic. No signs of ETOH withdrawal. No C/Os. Excited that diet has been liberalized.    Objective Vitals:   06/07/19 0740 06/07/19 0800 06/07/19 0830 06/07/19 0900  BP: (!) 154/85 (!) 141/101 (!) 154/84 (!) 148/79  Pulse: 81 74 81 80  Resp:      Temp:      TempSrc:      SpO2:      Weight:      Height:       PGeneral: Chronically ill appearing male in NAD Heart:S1,S2, RRR Lungs: CTAB A/P Abdomen: Active BS Extremities: Trace BLE edema. Dialysis Access: Maturing LUA AVF + bruit new RIJ TDC exchanged 06/05/2019 blood lines connected, no issues with [facility]   Additional Objective Labs: Basic Metabolic Panel: Recent Labs  Lab 06/06/19 1953 06/06/19 2304 06/07/19 0727  NA 134* 136 136  K 3.2* 3.2* 3.2*  CL 96* 97* 98  CO2 27 28 28   GLUCOSE 114* 117* 102*  BUN 23 24* 27*  CREATININE 4.07* 4.30* 4.61*  CALCIUM 8.7* 8.7* 8.8*  PHOS 2.4* 2.5 2.7   Liver Function Tests: Recent Labs  Lab 06/06/19 1953 06/06/19 2304 06/07/19 0727  ALBUMIN 1.8* 1.9* 1.8*   No results for input(s): LIPASE, AMYLASE in the last 168 hours. CBC: Recent Labs  Lab 06/04/19 1258 06/04/19 1956 06/05/19 0606 06/06/19 0410 06/06/19 2304 06/07/19 0727  WBC 3.0* 4.1 4.4 4.8 5.1 4.7  NEUTROABS 2.1  --   --   --   --   --   HGB 6.4* 7.0* 7.3* 6.5* 7.4* 7.2*  HCT 20.2* 20.9* 21.8* 19.5* 22.0* 21.2*  MCV 101.0* 95.0 95.2 94.2 91.3 93.0  PLT 197 196 199 169 175 171   Blood Culture    Component Value Date/Time   SDES URINE, RANDOM 10/01/2014 0316   SPECREQUEST NONE 10/01/2014 0316   CULT  10/01/2014 0316    INSIGNIFICANT GROWTH Performed at [facility] 10/02/2014 FINAL 10/01/2014 0316    Cardiac Enzymes: No results for input(s): CKTOTAL, CKMB, CKMBINDEX, TROPONINI in the last 168 hours. CBG: Recent Labs  Lab 06/06/19 0727 06/06/19 1139  06/06/19 1700 06/06/19 2139 06/07/19 0610  GLUCAP 129* 201* 130* 107* 103*   Iron Studies: No results for input(s): IRON, TIBC, TRANSFERRIN, FERRITIN in the last 72 hours. @lablastinr3 @ Studies/Results: Ir Fluoro Guide Cv Line Right  Result Date: 06/05/2019 INDICATION: 63 year old with end-stage renal disease and needs new dialysis access. EXAM: FLUOROSCOPIC AND ULTRASOUND GUIDED PLACEMENT OF A TUNNELED DIALYSIS CATHETER Physician: Stephan Minister. Anselm Pancoast, MD MEDICATIONS: Ancef 2 g; The antibiotic was administered within an appropriate time interval prior to skin puncture. ANESTHESIA/SEDATION: Versed 1.0 mg IV; Fentanyl 75 mcg IV; Moderate Sedation Time:  25 minutes The patient was continuously monitored during the procedure by the interventional radiology nurse under my direct supervision. FLUOROSCOPY TIME:  Fluoroscopy Time: 1 minute, 3.2 mGy COMPLICATIONS: None immediate. PROCEDURE: Informed consent was obtained for placement of a tunneled dialysis catheter. The patient was placed supine on the interventional table. Ultrasound confirmed a patent right internal jugular vein. Ultrasound images were obtained for documentation. The right side of the neck and chest was prepped and draped in a sterile fashion. The right neck was anesthetized with 1% lidocaine. Maximal barrier sterile technique was utilized including caps, mask, sterile gowns, sterile gloves, sterile drape, hand hygiene and skin antiseptic. A small  incision was made with #11 blade scalpel. A 21 gauge needle directed into the right internal jugular vein with ultrasound guidance. A micropuncture dilator set was placed. A 19 cm tip to cuff Palindrome catheter was selected. The skin below the right clavicle was anesthetized and a small incision was made with an #11 blade scalpel. A subcutaneous tunnel was formed to the vein dermatotomy site. The catheter was brought through the tunnel. The vein dermatotomy site was dilated to accommodate a peel-away sheath.  The catheter was placed through the peel-away sheath and directed into the central venous structures. The tip of the catheter was placed at the SVC and right atrium junction with fluoroscopy. Fluoroscopic images were obtained for documentation. Both lumens were found to aspirate and flush well. The proper amount of heparin was flushed in both lumens. The vein dermatotomy site was closed using a single layer of absorbable suture and Dermabond. Gel-Foam was placed within the subcutaneous tract for hemostasis. The catheter was secured to the skin using Prolene suture. IMPRESSION: Successful placement of a right jugular tunneled dialysis catheter using ultrasound and fluoroscopic guidance. Electronically Signed   By: Markus Daft M.D.   On: 06/05/2019 18:07   Ir US Guide Vasc Access Right  Result Date: 06/05/2019 INDICATION: 63 year old with end-stage renal disease and needs new dialysis access. EXAM: FLUOROSCOPIC AND ULTRASOUND GUIDED PLACEMENT OF A TUNNELED DIALYSIS CATHETER Physician: Stephan Minister. Anselm Pancoast, MD MEDICATIONS: Ancef 2 g; The antibiotic was administered within an appropriate time interval prior to skin puncture. ANESTHESIA/SEDATION: Versed 1.0 mg IV; Fentanyl 75 mcg IV; Moderate Sedation Time:  25 minutes The patient was continuously monitored during the procedure by the interventional radiology nurse under my direct supervision. FLUOROSCOPY TIME:  Fluoroscopy Time: 1 minute, 3.2 mGy COMPLICATIONS: None immediate. PROCEDURE: Informed consent was obtained for placement of a tunneled dialysis catheter. The patient was placed supine on the interventional table. Ultrasound confirmed a patent right internal jugular vein. Ultrasound images were obtained for documentation. The right side of the neck and chest was prepped and draped in a sterile fashion. The right neck was anesthetized with 1% lidocaine. Maximal barrier sterile technique was utilized including caps, mask, sterile gowns, sterile gloves, sterile drape,  hand hygiene and skin antiseptic. A small incision was made with #11 blade scalpel. A 21 gauge needle directed into the right internal jugular vein with ultrasound guidance. A micropuncture dilator set was placed. A 19 cm tip to cuff Palindrome catheter was selected. The skin below the right clavicle was anesthetized and a small incision was made with an #11 blade scalpel. A subcutaneous tunnel was formed to the vein dermatotomy site. The catheter was brought through the tunnel. The vein dermatotomy site was dilated to accommodate a peel-away sheath. The catheter was placed through the peel-away sheath and directed into the central venous structures. The tip of the catheter was placed at the SVC and right atrium junction with fluoroscopy. Fluoroscopic images were obtained for documentation. Both lumens were found to aspirate and flush well. The proper amount of heparin was flushed in both lumens. The vein dermatotomy site was closed using a single layer of absorbable suture and Dermabond. Gel-Foam was placed within the subcutaneous tract for hemostasis. The catheter was secured to the skin using Prolene suture. IMPRESSION: Successful placement of a right jugular tunneled dialysis catheter using ultrasound and fluoroscopic guidance. Electronically Signed   By: Markus Daft M.D.   On: 06/05/2019 18:07   Medications: . sodium chloride    . sodium  chloride     . sodium chloride   Intravenous Once  . Chlorhexidine Gluconate Cloth  6 each Topical Q0600  . Chlorhexidine Gluconate Cloth  6 each Topical Q0600  . heparin  5,000 Units Subcutaneous Q8H  . insulin aspart  0-5 Units Subcutaneous QHS  . insulin aspart  0-9 Units Subcutaneous TID WC  . LORazepam  1 mg Oral BID  . metoprolol tartrate  5 mg Intravenous Once  . metoprolol tartrate  12.5 mg Oral BID  . multivitamin  1 tablet Oral QHS  . pantoprazole (PROTONIX) IV  40 mg Intravenous Q12H  . thiamine  100 mg Oral Daily      Dialysis:MWF [facility] 4h  450/800 98kg 3K/2.25 LUAVF maturing(04/04/2019 by Dr. Donzetta Matters) Heparin3400 units IV bolus Mircera284mcg IV q 2wks - last 6/29  Last Labs:05/31/2019:Hgb 7.5, TSATcannot be calculated, K2.6, Ca6.4, P<1, Alb2.8,PTH149 on 6/17  Assessment/Plan: 1. Dislodged TDC/ need for HD access: Resolved, new TDC placed in IR yesterday. Appreciate IR's assistance.  2. Symptomatic anemia, AOCA CKD - HGB 7.2 after 2 units PRBCS. Concern for GIB. Seen by GI-recommended PPI. He is to follow up with GI at [facility]. Redose ESA with HD today-give Aranesp 200 mcg IV.  3. Hypokalemia-using 4.0 K bath here, liberalized diet.  4. ESRD - HD MWF. HD today on schedule. K+ 3.2 Use 4.0 K bath, no heparin.  5. Hypertension/volume - Blood pressure well controlled. Does not appear volume overloaded. Likely losing weight, has been under EDW. Titrate down as tolerated. Lower EDW on d/c. Attempting UFG 2.0 Liters today.  6. Secondary Hyperparathyroidism - Ca 81. OP phos <1. Rec'd 30 MMol sodium phos 07/06. Repeat phos 2.4-has been corrected to low. Not on VDRA or binders.Liberalized diet.  7. ETOH abuse. Getting into window where withdrawal may be an issue. Anxious and tearful while talking. Says he hasn't wanted a drink that bad. Ativan 1 mg PO BID for now. Discussed with primary. No evidence of withdrawal today. Continue ativan.  8. Nutrition-low phos and low K+ change to regular diet with fld restrictions. Add prostat, renal vits. Thiamine.  9. Tachycardia-has been as high as 120s in HD unit. On BB.   Rita H. Brown NP-C 06/07/2019, 9:23 AM  [facility] [phone]  Pt seen, examined and agree w A/P as above.  Kelly Splinter  MD 06/07/2019, 1:18 PM

## 2019-06-29 ENCOUNTER — Encounter: Payer: Self-pay | Admitting: Gastroenterology

## 2019-06-30 ENCOUNTER — Encounter (HOSPITAL_COMMUNITY): Payer: Self-pay | Admitting: Nephrology

## 2019-07-03 ENCOUNTER — Ambulatory Visit: Payer: Medicare Other | Admitting: Gastroenterology

## 2019-07-07 ENCOUNTER — Encounter: Payer: Self-pay | Admitting: Gastroenterology

## 2019-07-07 ENCOUNTER — Encounter (INDEPENDENT_AMBULATORY_CARE_PROVIDER_SITE_OTHER): Payer: Self-pay

## 2019-07-07 ENCOUNTER — Ambulatory Visit (INDEPENDENT_AMBULATORY_CARE_PROVIDER_SITE_OTHER): Payer: Medicare Other | Admitting: Gastroenterology

## 2019-07-07 VITALS — HR 102 | Temp 97.6°F | Ht 74.0 in

## 2019-07-07 DIAGNOSIS — K920 Hematemesis: Secondary | ICD-10-CM

## 2019-07-07 DIAGNOSIS — K219 Gastro-esophageal reflux disease without esophagitis: Secondary | ICD-10-CM | POA: Diagnosis not present

## 2019-07-07 DIAGNOSIS — D62 Acute posthemorrhagic anemia: Secondary | ICD-10-CM | POA: Diagnosis not present

## 2019-07-07 MED ORDER — SUPREP BOWEL PREP KIT 17.5-3.13-1.6 GM/177ML PO SOLN: 1.0000 | ORAL | 0 refills | Status: DC

## 2019-07-07 MED ORDER — PANTOPRAZOLE SODIUM 40 MG PO TBEC: 40.0000 mg | DELAYED_RELEASE_TABLET | Freq: Every day | ORAL | 1 refills | Status: DC

## 2019-07-07 NOTE — Patient Instructions (Addendum)
You have been scheduled for an endoscopy and colonoscopy. Please follow the written instructions given to you at your visit today. Please pick up your prep supplies at the pharmacy within the next 1-3 days. If you use inhalers (even only as needed), please bring them with you on the day of your procedure. Your physician has requested that you go to www.[email or web address] and enter the access code given to you at your visit today. This web site gives a general overview about your procedure. However, you should still follow specific instructions given to you by our office regarding your preparation for the procedure.  Please purchase the following medications over the counter and take as directed: Benefiber or Citrucel powder daily   If you are age 76 or older, your body mass index should be between 23-30. Your Body mass index is 25.87 kg/m. If this is out of the aforementioned range listed, please consider follow up with your Primary Care Provider.  If you are age 50 or younger, your body mass index should be between 19-25. Your Body mass index is 25.87 kg/m. If this is out of the aformentioned range listed, please consider follow up with your Primary Care Provider.

## 2019-07-07 NOTE — Progress Notes (Signed)
07/07/2019 Noah Vang [id number] 1956/05/31   HISTORY OF PRESENT ILLNESS: This is a 63 year old male who is new to our office.  Was referred here by Dr. Vanetta Mulders of nephrology.  He has been referred here for issues with anemia.  He has chronic anemia due to his end-stage renal disease.  Is on dialysis.  Receives Epogen.  Recently had a drop in hemoglobin to 6.4 g at least on one occasion.  Has received at least 2 blood transfusions recently and is scheduled for another one next week.  The most recent hemoglobin I have is from July 10 at 8.3 g.  He said that it was lower than that most recently.  He gets it checked regularly at dialysis.  He was actually Hemoccult negative on July 7, but says that about 3 weeks ago he had an episode of vomiting and had vomited a rather large clot.  He is on pantoprazole 40 mg daily and would actually like that medication refilled.  He tells me his last colonoscopy was 10 years ago at the [facility].  He says that they actually were planning on doing another one for him recently, but he would just like to proceed with whatever we decide to do here and then he says that he can "deal with them later".  He says that he does not look at his stools, so unable to tell if they have been black or bloody.  He is in a wheelchair but reports that he is able to stand for short periods of time and pivot.  He reports issues with alternating constipation and loose stools recently.   Past Medical History:  Diagnosis Date  . Alcohol use disorder, moderate, dependence ([place]) 06/06/2019  . Anemia   . Breakdown of surgically created AV fistula, init ([place]) 11/15/2018  . CRBSI (catheter-related bloodstream infection) 09/28/2014  . Diabetes mellitus without complication ([place])    no longer taking meds  . Dialysis patient ([place])   . DM (diabetes mellitus) ([place]) 09/28/2014  . ESRD (end stage renal disease) ([place])    MWF- [facility]  . ESRD on dialysis ([place])  09/28/2014  . History of atrial fibrillation 09/29/2014  . Hypertension    no longer on meds, BP now low on dialysis  . Hypocalcemia   . Hypokalemia   . Pneumonia   . Protein-calorie malnutrition, mild ([place])   . Secondary hyperparathyroidism of renal origin ([place])   . Septic arthritis of sacroiliac joint ([place])   . Staphylococcus aureus bacteremia with sepsis ([place])   . SVT (supraventricular tachycardia) ([place]) 09/28/2014  . Type 2 diabetes mellitus with diabetic chronic kidney disease [facility])    Past Surgical History:  Procedure Laterality Date  . A/V FISTULAGRAM N/A 03/30/2019   Procedure: A/V FISTULAGRAM - Left Arm;  Surgeon: Waynetta Sandy, MD;  Location: [facility];  Service: Cardiovascular;  Laterality: N/A;  . AV FISTULA PLACEMENT Right 04/04/2019   Procedure: ARTERIOVENOUS (AV) FISTULA CREATION;  Surgeon: Waynetta Sandy, MD;  Location: [facility];  Service: Vascular;  Laterality: Right;  . CHOLECYSTECTOMY    . COLONOSCOPY    . COLONOSCOPY WITH ESOPHAGOGASTRODUODENOSCOPY (EGD)  04/2017   at [facility] in [place]  . DIALYSIS FISTULA CREATION    . ESOPHAGOGASTRODUODENOSCOPY  08/2015   For UGI bleed, melena, anemia.  Dr Aurelio Brash in [facility].  found and cauterized duodenal bulb ulcer with VV  . FISTULA SUPERFICIALIZATION Left 11/24/2018   Procedure:  PLICATION ARTERIOVENOUS FISTULA LEFT UPPER ARM;  Surgeon: Marty Heck, MD;  Location: [facility];  Service: Vascular;  Laterality: Left;  . INSERTION OF DIALYSIS CATHETER Right 04/04/2019   Procedure: INSERTION OF TUNNELED  DIALYSIS CATHETER;  Surgeon: Waynetta Sandy, MD;  Location: [facility];  Service: Vascular;  Laterality: Right;  . IR FLUORO GUIDE CV LINE RIGHT  06/05/2019  . IR US GUIDE VASC ACCESS RIGHT  06/05/2019  . LIGATION OF ARTERIOVENOUS  FISTULA Left 04/04/2019   Procedure: LIGATION OF ARTERIOVENOUS  FISTULA LEFT ARM;  Surgeon: Waynetta Sandy, MD;  Location: [facility];  Service: Vascular;   Laterality: Left;  . TEE WITHOUT CARDIOVERSION N/A 10/03/2014   Procedure: TRANSESOPHAGEAL ECHOCARDIOGRAM (TEE);  Surgeon: Thayer Headings, MD;  Location[facility] ENDOSCOPY;  Service: Cardiovascular;  Laterality: N/A;    reports that he has never smoked. He has never used smokeless tobacco. He reports current alcohol use of about 44.0 standard drinks of alcohol per week. He reports previous drug use. family history includes Cancer in his father; Diabetes in his father and mother. Allergies  Allergen Reactions  . Benadryl [Diphenhydramine] Other (See Comments)    hallucinations  . Vancomycin Rash and Other (See Comments)    hallucinations      Outpatient Encounter Medications as of 07/07/2019  Medication Sig  . acetaminophen (TYLENOL) 500 MG tablet Take 500 mg by mouth every 6 (six) hours as needed for mild pain.  . ferrous sulfate 325 (65 FE) MG tablet Take 1 tablet (325 mg total) by mouth daily with breakfast.  . metoprolol succinate (TOPROL-XL) 25 MG 24 hr tablet Take 25 mg by mouth at bedtime.   . pantoprazole (PROTONIX) 40 MG tablet Take 1 tablet (40 mg total) by mouth daily.   No facility-administered encounter medications on file as of 07/07/2019.      REVIEW OF SYSTEMS  : All other systems reviewed and negative except where noted in the History of Present Illness.   PHYSICAL EXAM: Pulse (!) 102   Temp 97.6 F (36.4 C)   Ht 6\' 2"  (1.88 m)   BMI 25.87 kg/m  General: Well developed black male in no acute distress; in wheelchair Head: Normocephalic and atraumatic Eyes:  Sclerae anicteric, conjunctiva pink. Ears: Normal auditory acuity Lungs: Clear throughout to auscultation; no increased WOB. Heart:  Slightly tachy.  No murmurs noted. Abdomen: Soft, non-distended.  BS present.  Non-tender Rectal:  Will be done at the time of colonoscopy. Musculoskeletal: Symmetrical with no gross deformities  Skin: No lesions on visible extremities Extremities: No edema  Neurological: Alert  oriented x 4, grossly non-focal Psychological:  Alert and cooperative. Normal mood and affect  ASSESSMENT AND PLAN: *Anemia, acute on chronic:  Likely acutely due to GI blood loss issue, chronically due to ESRD (receives Epogen).  Reports vomiting blood clot 3 weeks ago.  Has required transfusion x 2 recently for Hgb at least as low as 6.4 grams.  Reports last colonoscopy 10 years ago at the [facility] [place]. I think that he needs both EGD and colonoscopy.  These have been scheduled with Dr. Rush Landmark in the [facility].  Patient is in wheelchair but says that he is able to stand and pivot. *Alternating bowel habits between loose stools and constipation:  Will try daily Benefiber or Citrucel. *GERD:  Will refill pantoprazole prescription. *ESRD on HD MWF  **The risks, benefits, and alternatives to EGD and colonoscopy were discussed with the patient and he consents to proceed.    CC:  [facility], [facility] CC:  Dr. Moshe Cipro

## 2019-07-11 ENCOUNTER — Ambulatory Visit (HOSPITAL_COMMUNITY)
Admission: RE | Admit: 2019-07-11 | Discharge: 2019-07-11 | Disposition: A | Discharge status: Home / Self Care | Payer: Medicare Other | Source: Ambulatory Visit | Attending: Nurse Practitioner | Admitting: Nurse Practitioner

## 2019-07-11 ENCOUNTER — Other Ambulatory Visit: Payer: Self-pay

## 2019-07-11 DIAGNOSIS — N186 End stage renal disease: Secondary | ICD-10-CM | POA: Insufficient documentation

## 2019-07-11 DIAGNOSIS — D649 Anemia, unspecified: Secondary | ICD-10-CM | POA: Insufficient documentation

## 2019-07-11 LAB — PREPARE RBC (CROSSMATCH)

## 2019-07-11 MED ORDER — SODIUM CHLORIDE 0.9% IV SOLUTION
Freq: Once | INTRAVENOUS | Status: AC
  Administered 2019-07-11: 11:00:00 via INTRAVENOUS

## 2019-07-11 NOTE — Progress Notes (Signed)
Patient received 1 unit packed red blood cells via a PIV. Pre transfusion type and screen was done. Pre transfusion BP was 150/80. Patient provider's office was notified. Per the RN, it was okay to transfuse. Tolerated well, post transfusion BP was 164/77. Discharge instructions given, verbalized understanding. Patient alert, oriented and transported in a wheel chair at the time of discharge.

## 2019-07-11 NOTE — Discharge Instructions (Signed)
Blood Transfusion, Adult, Care After This sheet gives you information about how to care for yourself after your procedure. Your doctor may also give you more specific instructions. If you have problems or questions, contact your doctor. Follow these instructions at home:   Take over-the-counter and prescription medicines only as told by your doctor.  Go back to your normal activities as told by your doctor.  Follow instructions from your doctor about how to take care of the area where an IV tube was put into your vein (insertion site). Make sure you: ? Wash your hands with soap and water before you change your bandage (dressing). If there is no soap and water, use hand sanitizer. ? Change your bandage as told by your doctor.  Check your IV insertion site every day for signs of infection. Check for: ? More redness, swelling, or pain. ? More fluid or blood. ? Warmth. ? Pus or a bad smell. Contact a doctor if:  You have more redness, swelling, or pain around the IV insertion site.  You have more fluid or blood coming from the IV insertion site.  Your IV insertion site feels warm to the touch.  You have pus or a bad smell coming from the IV insertion site.  Your pee (urine) turns pink, red, or brown.  You feel weak after doing your normal activities. Get help right away if:  You have signs of a serious allergic or body defense (immune) system reaction, including: ? Itchiness. ? Hives. ? Trouble breathing. ? Anxiety. ? Pain in your chest or lower back. ? Fever, flushing, and chills. ? Fast pulse. ? Rash. ? Watery poop (diarrhea). ? Throwing up (vomiting). ? Dark pee. ? Serious headache. ? Dizziness. ? Stiff neck. ? Yellow color in your face or the white parts of your eyes (jaundice). Summary  After a blood transfusion, return to your normal activities as told by your doctor.  Every day, check for signs of infection where the IV tube was put into your vein.  Some  signs of infection are warm skin, more redness and pain, more fluid or blood, and pus or a bad smell where the needle went in.  Contact your doctor if you feel weak or have any unusual symptoms. This information is not intended to replace advice given to you by your health care provider. Make sure you discuss any questions you have with your health care provider. Document Released: 12/07/2014 Document Revised: 03/23/2018 Document Reviewed: 07/10/2016 Elsevier Patient Education  2020 [employer]  

## 2019-07-12 ENCOUNTER — Encounter: Payer: Self-pay | Admitting: Gastroenterology

## 2019-07-12 DIAGNOSIS — K219 Gastro-esophageal reflux disease without esophagitis: Secondary | ICD-10-CM | POA: Insufficient documentation

## 2019-07-12 DIAGNOSIS — K920 Hematemesis: Secondary | ICD-10-CM | POA: Insufficient documentation

## 2019-07-12 DIAGNOSIS — D62 Acute posthemorrhagic anemia: Secondary | ICD-10-CM | POA: Insufficient documentation

## 2019-07-12 LAB — TYPE AND SCREEN
ABO/RH(D): A POS
Antibody Screen: NEGATIVE
Unit division: 0

## 2019-07-12 LAB — BPAM RBC
Blood Product Expiration Date: 202008292359
ISSUE DATE / TIME: 202008111055
Unit Type and Rh: 6200

## 2019-08-01 ENCOUNTER — Encounter: Payer: Medicare Other | Admitting: Gastroenterology

## 2019-08-12 ENCOUNTER — Encounter: Payer: Self-pay | Admitting: Gastroenterology

## 2019-08-12 ENCOUNTER — Other Ambulatory Visit: Payer: Self-pay

## 2019-08-12 ENCOUNTER — Ambulatory Visit (AMBULATORY_SURGERY_CENTER): Payer: Medicare Other | Admitting: Gastroenterology

## 2019-08-12 VITALS — BP 188/67 | HR 80 | Temp 98.4°F | Resp 16 | Ht 74.0 in | Wt 225.0 lb

## 2019-08-12 DIAGNOSIS — K920 Hematemesis: Secondary | ICD-10-CM

## 2019-08-12 DIAGNOSIS — D62 Acute posthemorrhagic anemia: Secondary | ICD-10-CM

## 2019-08-12 DIAGNOSIS — D123 Benign neoplasm of transverse colon: Secondary | ICD-10-CM

## 2019-08-12 DIAGNOSIS — K2970 Gastritis, unspecified, without bleeding: Secondary | ICD-10-CM

## 2019-08-12 DIAGNOSIS — D122 Benign neoplasm of ascending colon: Secondary | ICD-10-CM | POA: Diagnosis not present

## 2019-08-12 DIAGNOSIS — K449 Diaphragmatic hernia without obstruction or gangrene: Secondary | ICD-10-CM

## 2019-08-12 DIAGNOSIS — Z1211 Encounter for screening for malignant neoplasm of colon: Secondary | ICD-10-CM | POA: Diagnosis not present

## 2019-08-12 DIAGNOSIS — K2980 Duodenitis without bleeding: Secondary | ICD-10-CM

## 2019-08-12 DIAGNOSIS — D120 Benign neoplasm of cecum: Secondary | ICD-10-CM | POA: Diagnosis not present

## 2019-08-12 MED ORDER — SODIUM CHLORIDE 0.9 % IV SOLN: 500.0000 mL | INTRAVENOUS | Status: AC

## 2019-08-12 NOTE — Progress Notes (Signed)
Message routed to Dr. Donneta Romberg nurse Koren Shiver) to send his Protonix RX to the [place] [facility] in [place] on Monday per his his request.

## 2019-08-12 NOTE — Progress Notes (Signed)
Called to room to assist during endoscopic procedure.  Patient ID and intended procedure confirmed with present staff. Received instructions for my participation in the procedure from the performing physician.  

## 2019-08-12 NOTE — Op Note (Signed)
[facility] Patient Name: Noah Vang Procedure Date: 08/12/2019 8:49 AM MRN: [id number] Endoscopist: Justice Britain , MD Age: 63 Referring MD:  Date of Birth: 03/28/1956 Gender: Male Account #: [id number] Procedure:                Upper GI endoscopy Indications:              Epigastric abdominal distress, Hematemesis,                            Dyspepsia, Nausea Medicines:                Monitored Anesthesia Care Procedure:                Pre-Anesthesia Assessment:                           - Prior to the procedure, a History and Physical                            was performed, and patient medications and                            allergies were reviewed. The patient's tolerance of                            previous anesthesia was also reviewed. The risks                            and benefits of the procedure and the sedation                            options and risks were discussed with the patient.                            All questions were answered, and informed consent                            was obtained. Prior Anticoagulants: The patient has                            taken no previous anticoagulant or antiplatelet                            agents. ASA Grade Assessment: III - A patient with                            severe systemic disease. After reviewing the risks                            and benefits, the patient was deemed in                            satisfactory condition to undergo the procedure.  After obtaining informed consent, the endoscope was                            passed under direct vision. Throughout the                            procedure, the patient's blood pressure, pulse, and                            oxygen saturations were monitored continuously. The                            Endoscope was introduced through the mouth, and                            advanced to the second part of duodenum.  The upper                            GI endoscopy was accomplished without difficulty.                            The patient tolerated the procedure. Scope In: Scope Out: Findings:                 Dilated veins were found in the proximal esophagus.                           No gross mucosal lesions were noted in the entire                            esophagus. Biopsies were taken with a cold forceps                            for histology to rule out EoE.                           A small hiatal hernia was found. The proximal                            extent of the gastric folds (end of tubular                            esophagus) was 40 cm from the incisors. The hiatal                            narrowing was 43 cm from the incisors. The Z-line                            was 40 cm from the incisors.                           Patchy mild mucosal changes characterized by  erythema and erosion were found in the gastric body                            and in the gastric antrum.                           No other gross mucosal lesions were noted in the                            entire examined stomach. Biopsies were taken with a                            cold forceps for histology and Helicobacter pylori                            testing.                           Localized moderate mucosal changes characterized by                            erythema and granularity were found in the duodenal                            bulb and in the D1/D2 sweep. Biopsies were taken                            with a cold forceps for histology to rule out                            enteropathy.                           Normal mucosa was found in the second portion of                            the duodenum. Complications:            No immediate complications. Estimated Blood Loss:     Estimated blood loss was minimal. 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 No gross                            mucosal lesions in esophagus. Biopsied.                           - Small hiatal hernia.                           - Erythematous and eroded mucosa in the gastric                            body and antrum. Biopsied.                           -  Mucosal changes in bulb and D1/D2 sweep.                            Biopsied. Normal mucosa was found in the second                            portion of the duodenum. Recommendation:           - Proceed to scheduled colonoscopy.                           - Increase PPI to 40 mg twice daily for next                            48-months and then decrease to once daily.                           - Await pathology results and if positive for HP                            then will need treatment.                           - Can discuss in future clinic visit whether                            patient will want repeat EGD to see if                            gastritis/duodenitis is healed to know if we can go                            back down to 40 mg daily PPI or if he will need to                            remain on 40 mg BID PPI.                           - The findings and recommendations were discussed                            with the patient. Justice Britain, MD 08/12/2019 9:57:14 AM

## 2019-08-12 NOTE — Op Note (Signed)
[facility] Patient Name: Noah Vang Procedure Date: 08/12/2019 8:49 AM MRN: [id number] Endoscopist: Justice Britain , MD Age: 63 Referring MD:  Date of Birth: 1956-10-19 Gender: Male Account #: [id number] Procedure:                Colonoscopy Indications:              Screening for colorectal malignant neoplasm,                            Screening for colorectal malignant neoplasm (last                            colonoscopy was more than 10 years ago) Medicines:                Monitored Anesthesia Care Procedure:                Pre-Anesthesia Assessment:                           - Prior to the procedure, a History and Physical                            was performed, and patient medications and                            allergies were reviewed. The patient's tolerance of                            previous anesthesia was also reviewed. The risks                            and benefits of the procedure and the sedation                            options and risks were discussed with the patient.                            All questions were answered, and informed consent                            was obtained. Prior Anticoagulants: The patient has                            taken no previous anticoagulant or antiplatelet                            agents. ASA Grade Assessment: III - A patient with                            severe systemic disease. After reviewing the risks                            and benefits, the patient was deemed in  satisfactory condition to undergo the procedure.                           After obtaining informed consent, the colonoscope                            was passed under direct vision. Throughout the                            procedure, the patient's blood pressure, pulse, and                            oxygen saturations were monitored continuously. The                            Colonoscope was  introduced through the anus and                            advanced to the 5 cm into the ileum. The                            colonoscopy was performed without difficulty. The                            patient tolerated the procedure. The quality of the                            bowel preparation was good. The terminal ileum,                            ileocecal valve, appendiceal orifice, and rectum                            were photographed. Scope In: 9:13:44 AM Scope Out: 9:42:40 AM Scope Withdrawal Time: 0 hours 26 minutes 31 seconds  Total Procedure Duration: 0 hours 28 minutes 56 seconds  Findings:                 Skin tags were found on perianal exam.                           The digital rectal exam findings include                            hemorrhoids. Pertinent negatives include no                            palpable rectal lesions.                           The terminal ileum and ileocecal valve appeared                            normal.  Eight sessile and semi-sessile polyps were found in                            the descending colon (1), transverse colon (2),                            ascending colon (2), and cecum (3). The polyps were                            2 to 10 mm in size. These polyps were removed with                            a cold snare. Resection and retrieval were complete.                           Multiple small-mouthed diverticula were found in                            the entire colon including a cecal diverticulum.                           Normal mucosa was found in the entire colon                            otherwise.                           Non-bleeding non-thrombosed external and internal                            hemorrhoids were found during retroflexion, during                            perianal exam and during digital exam. The                            hemorrhoids were Grade II (internal hemorrhoids                             that prolapse but reduce spontaneously). Complications:            No immediate complications. Estimated Blood Loss:     Estimated blood loss was minimal. Impression:               - Perianal skin tags found on perianal exam.                           - Hemorrhoids found on digital rectal exam.                           - The examined portion of the ileum was normal.                           - Eight 2 to 10 mm polyps in the descending colon,  in the transverse colon, in the ascending colon and                            in the cecum, removed with a cold snare. Resected                            and retrieved.                           - Diverticulosis in the entire examined colon.                           - Normal mucosa in the entire examined colon                            otherwise.                           - Non-bleeding non-thrombosed external and internal                            hemorrhoids. Recommendation:           - The patient will be observed post-procedure,                            until all discharge criteria are met.                           - Discharge patient to home.                           - Patient has a contact number available for                            emergencies. The signs and symptoms of potential                            delayed complications were discussed with the                            patient. Return to normal activities tomorrow.                            Written discharge instructions were provided to the                            patient.                           - High fiber diet.                           - Use FiberCon 1 tablet PO daily.                           - Preparation H cream:  Apply externally BID.                           - Take Sitz baths QHS if able but mobility may make                            this difficult if not impossible.                           -  Continue present medications.                           - Await pathology results.                           - Repeat colonoscopy in 3 years for surveillance.                           - The findings and recommendations were discussed                            with the patient. Justice Britain, MD 08/12/2019 10:03:47 AM

## 2019-08-12 NOTE — Progress Notes (Signed)
A dime sized opened area of erythema is noted just right of center on the upper right buttock. It is already dressed with 4/4 gauze and tape with small amount of blood noted on the previous dressing. New dressing applied. MD notified and advised patient to follow-up with his PCP regarding this wound for possible referral to wound care. Patient and his brother Merry Proud both verbalize understanding.

## 2019-08-12 NOTE — Progress Notes (Signed)
Cardell Peach, RN and Lorrin Goodell VS

## 2019-08-12 NOTE — Progress Notes (Signed)
Report to PACU, RN, vss, BBS= Clear.  

## 2019-08-12 NOTE — Patient Instructions (Addendum)
YOU HAD AN ENDOSCOPIC PROCEDURE TODAY AT [facility]:   Refer to the procedure report that was given to you for any specific questions about what was found during the examination.  If the procedure report does not answer your questions, please call your gastroenterologist to clarify.  If you requested that your care partner not be given the details of your procedure findings, then the procedure report has been included in a sealed envelope for you to review at your convenience later.  YOU SHOULD EXPECT: Some feelings of bloating in the abdomen. Passage of more gas than usual.  Walking can help get rid of the air that was put into your GI tract during the procedure and reduce the bloating. If you had a lower endoscopy (such as a colonoscopy or flexible sigmoidoscopy) you may notice spotting of blood in your stool or on the toilet paper. If you underwent a bowel prep for your procedure, you may not have a normal bowel movement for a few days.  Please Note:  You might notice some irritation and congestion in your nose or some drainage.  This is from the oxygen used during your procedure.  There is no need for concern and it should clear up in a day or so.  SYMPTOMS TO REPORT IMMEDIATELY:   Following lower endoscopy (colonoscopy or flexible sigmoidoscopy):  Excessive amounts of blood in the stool  Significant tenderness or worsening of abdominal pains  Swelling of the abdomen that is new, acute  Fever of 100F or higher   Following upper endoscopy (EGD)  Vomiting of blood or coffee ground material  New chest pain or pain under the shoulder blades  Painful or persistently difficult swallowing  New shortness of breath  Fever of 100F or higher  Black, tarry-looking stools  For urgent or emergent issues, a gastroenterologist can be reached at any hour by calling [phone].   DIET:  We do recommend a small meal at first, but then you may proceed to your regular diet.  Drink  plenty of fluids but you should avoid alcoholic beverages for 24 hours. Please follow a High Fiber Diet (see handout given to you by your recovery nurse).  MEDICATIONS: Use FiberCon 1 tablet by mouth daily. Use Preparation H cream: Apply to rectum twice daily. Continue present medications.   Increase Protonix (PPI med) to 40 mg by mouth twice daily for the next 3 months and then decrease to once daily. Message sent for Dr. Donneta Romberg nurse to send this prescription to the [facility] in [place] on Monday.  Take Sitz baths before bedtime if able, but mobility may make this difficult in not impossible.  Please see handouts given to you by your recovery nurse.  ACTIVITY:  You should plan to take it easy for the rest of today and you should NOT DRIVE or use heavy machinery until tomorrow (because of the sedation medicines used during the test).    FOLLOW UP: Our staff will call the number listed on your records 48-72 hours following your procedure to check on you and address any questions or concerns that you may have regarding the information given to you following your procedure. If we do not reach you, we will leave a message.  We will attempt to reach you two times.  During this call, we will ask if you have developed any symptoms of COVID 19. If you develop any symptoms (ie: fever, flu-like symptoms, shortness of breath, cough etc.) before then, please call [phone].  If you test positive for Covid 19 in the 2 weeks post procedure, please call and report this information to [place].    If any biopsies were taken you will be contacted by phone or by letter within the next 1-3 weeks.  Please call us at [phone] if you have not heard about the biopsies in 3 weeks.   Thank you for allowing [place] to provide for your healthcare needs today.  SIGNATURES/CONFIDENTIALITY: You and/or your care partner have signed paperwork which will be entered into your electronic medical record.  These signatures attest  to the fact that that the information above on your After Visit Summary has been reviewed and is understood.  Full responsibility of the confidentiality of this discharge information lies with you and/or your care-partner.   How to Take a [employer] A sitz bath is a warm water bath that may be used to care for your rectum, genital area, or the area between your rectum and genitals (perineum). For a sitz bath, the water only comes up to your hips and covers your buttocks. A sitz bath may done at home in a bathtub or with a portable sitz bath that fits over the toilet. Your health care provider may recommend a sitz bath to help:  Relieve pain and discomfort after delivering a baby.  Relieve pain and itching from hemorrhoids or anal fissures.  Relieve pain after certain surgeries.  Relax muscles that are sore or tight. How to take a sitz bath Take 3-4 sitz baths a day, or as many as told by your health care provider. Bathtub sitz bath To take a sitz bath in a bathtub: 1. Partially fill a bathtub with warm water. The water should be deep enough to cover your hips and buttocks when you are sitting in the tub. 2. If your health care provider told you to put medicine in the water, follow his or her instructions. 3. Sit in the water. 4. Open the tub drain a little, and leave it open during your bath. 5. Turn on the warm water again, enough to replace the water that is draining out. Keep the water running throughout your bath. This helps keep the water at the right level and the right temperature. 6. Soak in the water for 15-20 minutes, or as long as told by your health care provider. 7. When you are done, be careful when you stand up. You may feel dizzy. 8. After the sitz bath, pat yourself dry. Do not rub your skin to dry it.  Over-the-toilet sitz bath To take a sitz bath with an over-the-toilet basin: 1. Follow the manufacturer's instructions. 2. Fill the basin with warm water. 3. If your  health care provider told you to put medicine in the water, follow his or her instructions. 4. Sit on the seat. Make sure the water covers your buttocks and perineum. 5. Soak in the water for 15-20 minutes, or as long as told by your health care provider. 6. After the sitz bath, pat yourself dry. Do not rub your skin to dry it. 7. Clean and dry the basin between uses. 8. Discard the basin if it cracks, or according to the manufacturer's instructions. Contact a health care provider if:  Your symptoms get worse. Do not continue with sitz baths if your symptoms get worse.  You have new symptoms. If this happens, do not continue with sitz baths until you talk with your health care provider. Summary  A sitz bath is a warm  water bath in which the water only comes up to your hips and covers your buttocks.  A sitz bath may help relieve itching, relieve pain, and relax muscles that are sore or tight in the lower part of your body, including your genital area.  Take 3-4 sitz baths a day, or as many as told by your health care provider. Soak in the water for 15-20 minutes.  Do not continue with sitz baths if your symptoms get worse. This information is not intended to replace advice given to you by your health care provider. Make sure you discuss any questions you have with your health care provider. Document Released: 08/08/2004 Document Revised: 11/18/2017 Document Reviewed: 11/18/2017 Elsevier Patient Education  2020 [employer].

## 2019-08-14 ENCOUNTER — Telehealth: Payer: Self-pay

## 2019-08-14 ENCOUNTER — Telehealth: Payer: Self-pay | Admitting: *Deleted

## 2019-08-14 MED ORDER — PANTOPRAZOLE SODIUM 40 MG PO TBEC: 40.0000 mg | DELAYED_RELEASE_TABLET | Freq: Two times a day (BID) | ORAL | 2 refills | Status: AC

## 2019-08-14 NOTE — Telephone Encounter (Signed)
No answer for second post procedure call back. Left message for patient to call with questions or concerns. SM

## 2019-08-14 NOTE — Telephone Encounter (Signed)
-----   Message from Wendie Chess, RN sent at 08/12/2019  1:00 PM EDT ----- Regarding: Please send Protonix RX to [facility] on Monday per Dr. Donneta Romberg request Send RX for Protonix 40 mg po bid for the next 3 months, then decrease to 40 mg po qd thereafter. Please send this RX to the [facility] for this patient. Thank you very much.

## 2019-08-14 NOTE — Telephone Encounter (Signed)
No answer, unable to leave a message, B.Abrina Petz RN. 

## 2019-08-17 ENCOUNTER — Telehealth: Payer: Self-pay | Admitting: Gastroenterology

## 2019-08-17 NOTE — Telephone Encounter (Signed)
Left message on machine to call back  

## 2019-08-18 NOTE — Telephone Encounter (Signed)
Message left for the pt to return call to get more symptoms and concerns.

## 2019-08-21 NOTE — Telephone Encounter (Signed)
Will wait for further communication from the pt Left message on machine to call back

## 2019-08-22 ENCOUNTER — Emergency Department (HOSPITAL_COMMUNITY)
Admission: EM | Admit: 2019-08-22 | Discharge: 2019-08-22 | Disposition: A | Discharge status: Home / Self Care | Payer: No Typology Code available for payment source | Attending: Emergency Medicine | Admitting: Emergency Medicine

## 2019-08-22 ENCOUNTER — Other Ambulatory Visit: Payer: Self-pay

## 2019-08-22 ENCOUNTER — Encounter (HOSPITAL_COMMUNITY): Payer: Self-pay | Admitting: Emergency Medicine

## 2019-08-22 DIAGNOSIS — I120 Hypertensive chronic kidney disease with stage 5 chronic kidney disease or end stage renal disease: Secondary | ICD-10-CM | POA: Insufficient documentation

## 2019-08-22 DIAGNOSIS — N179 Acute kidney failure, unspecified: Secondary | ICD-10-CM

## 2019-08-22 DIAGNOSIS — N186 End stage renal disease: Secondary | ICD-10-CM | POA: Diagnosis not present

## 2019-08-22 DIAGNOSIS — Z79899 Other long term (current) drug therapy: Secondary | ICD-10-CM | POA: Diagnosis not present

## 2019-08-22 DIAGNOSIS — L89309 Pressure ulcer of unspecified buttock, unspecified stage: Secondary | ICD-10-CM | POA: Diagnosis present

## 2019-08-22 DIAGNOSIS — Z992 Dependence on renal dialysis: Secondary | ICD-10-CM | POA: Diagnosis not present

## 2019-08-22 LAB — CBG MONITORING, ED
Glucose-Capillary: 61 mg/dL — ABNORMAL LOW (ref 70–99)
Glucose-Capillary: 67 mg/dL — ABNORMAL LOW (ref 70–99)

## 2019-08-22 LAB — CBC WITH DIFFERENTIAL/PLATELET
Abs Immature Granulocytes: 0.02 10*3/uL (ref 0.00–0.07)
Basophils Absolute: 0 10*3/uL (ref 0.0–0.1)
Basophils Relative: 0 %
Eosinophils Absolute: 0 10*3/uL (ref 0.0–0.5)
Eosinophils Relative: 0 %
HCT: 29.4 % — ABNORMAL LOW (ref 39.0–52.0)
Hemoglobin: 9.7 g/dL — ABNORMAL LOW (ref 13.0–17.0)
Immature Granulocytes: 0 %
Lymphocytes Relative: 13 %
Lymphs Abs: 0.6 10*3/uL — ABNORMAL LOW (ref 0.7–4.0)
MCH: 33.2 pg (ref 26.0–34.0)
MCHC: 33 g/dL (ref 30.0–36.0)
MCV: 100.7 fL — ABNORMAL HIGH (ref 80.0–100.0)
Monocytes Absolute: 0.6 10*3/uL (ref 0.1–1.0)
Monocytes Relative: 12 %
Neutro Abs: 3.3 10*3/uL (ref 1.7–7.7)
Neutrophils Relative %: 75 %
Platelets: 170 10*3/uL (ref 150–400)
RBC: 2.92 MIL/uL — ABNORMAL LOW (ref 4.22–5.81)
RDW: 18 % — ABNORMAL HIGH (ref 11.5–15.5)
WBC: 4.6 10*3/uL (ref 4.0–10.5)
nRBC: 0 % (ref 0.0–0.2)

## 2019-08-22 LAB — COMPREHENSIVE METABOLIC PANEL
ALT: 17 U/L (ref 0–44)
AST: 68 U/L — ABNORMAL HIGH (ref 15–41)
Albumin: 1.8 g/dL — ABNORMAL LOW (ref 3.5–5.0)
Alkaline Phosphatase: 97 U/L (ref 38–126)
Anion gap: 22 — ABNORMAL HIGH (ref 5–15)
BUN: 20 mg/dL (ref 8–23)
CO2: 16 mmol/L — ABNORMAL LOW (ref 22–32)
Calcium: 8.4 mg/dL — ABNORMAL LOW (ref 8.9–10.3)
Chloride: 88 mmol/L — ABNORMAL LOW (ref 98–111)
Creatinine, Ser: 5.18 mg/dL — ABNORMAL HIGH (ref 0.61–1.24)
GFR calc Af Amer: 13 mL/min — ABNORMAL LOW (ref >60)
GFR calc non Af Amer: 11 mL/min — ABNORMAL LOW (ref >60)
Glucose, Bld: 61 mg/dL — ABNORMAL LOW (ref 70–99)
Potassium: 4.7 mmol/L (ref 3.5–5.1)
Sodium: 126 mmol/L — ABNORMAL LOW (ref 135–145)
Total Bilirubin: 1.4 mg/dL — ABNORMAL HIGH (ref 0.3–1.2)
Total Protein: 5 g/dL — ABNORMAL LOW (ref 6.5–8.1)

## 2019-08-22 MED ORDER — HYDROCODONE-ACETAMINOPHEN 5-325 MG PO TABS
2.0000 | ORAL_TABLET | Freq: Once | ORAL | Status: AC
  Administered 2019-08-22: 12:00:00 2 via ORAL
  Filled 2019-08-22: qty 2

## 2019-08-22 MED ORDER — FENTANYL CITRATE (PF) 100 MCG/2ML IJ SOLN
50.0000 ug | Freq: Once | INTRAMUSCULAR | Status: AC
  Administered 2019-08-22: 50 ug via INTRAVENOUS
  Filled 2019-08-22: qty 2

## 2019-08-22 MED ORDER — HYDROCODONE-ACETAMINOPHEN 5-325 MG PO TABS: 2.0000 | ORAL_TABLET | Freq: Once | ORAL | Status: DC

## 2019-08-22 NOTE — ED Provider Notes (Signed)
[place] EMERGENCY DEPARTMENT Provider Note   CSN: [id number] Arrival date & time: 08/22/19  1007     History   Chief Complaint Chief Complaint  Patient presents with  . Bed sores    HPI Noah Vang is a 63 y.o. male.     Pt has 2 complaints.  Pt missed dialysis yesterday and today.  He was rescheduled because he had a soiled diaper.  Pt states he also has skin breakdown from sitting in stool to long.  Patient has a caregiver that comes in the morning for 4 hours and a caregiver who comes in the afternoon for 2 hours.  Patient reports he is unable to clean himself so he sometimes is in stool for an extended period of time.  Denies having any fever he has not had any chills.  Patient denies any cough or congestion.  He reports his sister was very worried about his skin breakdown and wanted him to be evaluated today.  The history is provided by the patient. No language interpreter was used.    Past Medical History:  Diagnosis Date  . Alcohol use disorder, moderate, dependence ([place]) 06/06/2019  . Anemia   . Breakdown of surgically created AV fistula, init ([place]) 11/15/2018  . CRBSI (catheter-related bloodstream infection) 09/28/2014  . Diabetes mellitus without complication ([place])    no longer taking meds  . Dialysis patient ([place])   . DM (diabetes mellitus) ([place]) 09/28/2014  . ESRD (end stage renal disease) ([place])    MWF- [facility]  . ESRD on dialysis ([place]) 09/28/2014  . History of atrial fibrillation 09/29/2014  . Hypertension    no longer on meds, BP now low on dialysis  . Hypocalcemia   . Hypokalemia   . Pneumonia   . Protein-calorie malnutrition, mild ([place])   . Secondary hyperparathyroidism of renal origin ([place])   . Septic arthritis of sacroiliac joint ([place])   . Staphylococcus aureus bacteremia with sepsis ([place])   . SVT (supraventricular tachycardia) ([place]) 09/28/2014  . Type 2 diabetes mellitus with diabetic chronic kidney  disease [facility])     Patient Active Problem List   Diagnosis Date Noted  . Hematemesis 07/12/2019  . Acute blood loss anemia 07/12/2019  . Gastroesophageal reflux disease 07/12/2019  . Moderate alcohol use disorder ([place]) 06/06/2019  . Hemoglobin drop 06/06/2019  . Alcohol use disorder, moderate, dependence ([place]) 06/06/2019  . Pressure ulcer of right buttock, stage 2 ([place]) 06/05/2019  . Hypoalbuminemia, chronic 06/05/2019  . Problem with vascular access 06/05/2019  . Dialysis complication   . Displacement of vascular dialysis catheter ([place])   . Symptomatic anemia   . Left leg weakness   . Type 2 diabetes mellitus with diabetic chronic kidney disease ([place])   . Spinal stenosis of lumbar region   . History of atrial fibrillation 09/29/2014  . ESRD on dialysis ([place]) 09/28/2014  . HTN (hypertension) 09/28/2014    Past Surgical History:  Procedure Laterality Date  . A/V FISTULAGRAM N/A 03/30/2019   Procedure: A/V FISTULAGRAM - Left Arm;  Surgeon: Waynetta Sandy, MD;  Location: [place] CV LAB;  Service: Cardiovascular;  Laterality: N/A;  . AV FISTULA PLACEMENT Right 04/04/2019   Procedure: ARTERIOVENOUS (AV) FISTULA CREATION;  Surgeon: Waynetta Sandy, MD;  Location: [facility];  Service: Vascular;  Laterality: Right;  . CHOLECYSTECTOMY    . COLONOSCOPY    . COLONOSCOPY WITH ESOPHAGOGASTRODUODENOSCOPY (EGD)  04/2017   at [facility] in [place]  .  DIALYSIS FISTULA CREATION    . ESOPHAGOGASTRODUODENOSCOPY  08/2015   For UGI bleed, melena, anemia.  Dr Aurelio Brash in [facility].  found and cauterized duodenal bulb ulcer with VV  . FISTULA SUPERFICIALIZATION Left 58/07/9832   Procedure: PLICATION ARTERIOVENOUS FISTULA LEFT UPPER ARM;  Surgeon: Marty Heck, MD;  Location: [place];  Service: Vascular;  Laterality: Left;  . INSERTION OF DIALYSIS CATHETER Right 04/04/2019   Procedure: INSERTION OF TUNNELED  DIALYSIS CATHETER;  Surgeon: Waynetta Sandy, MD;   Location: [place];  Service: Vascular;  Laterality: Right;  . IR FLUORO GUIDE CV LINE RIGHT  06/05/2019  . IR US GUIDE VASC ACCESS RIGHT  06/05/2019  . LIGATION OF ARTERIOVENOUS  FISTULA Left 04/04/2019   Procedure: LIGATION OF ARTERIOVENOUS  FISTULA LEFT ARM;  Surgeon: Waynetta Sandy, MD;  Location: [place];  Service: Vascular;  Laterality: Left;  . TEE WITHOUT CARDIOVERSION N/A 10/03/2014   Procedure: TRANSESOPHAGEAL ECHOCARDIOGRAM (TEE);  Surgeon: Thayer Headings, MD;  Location: [place];  Service: Cardiovascular;  Laterality: N/A;        Home Medications    Prior to Admission medications   Medication Sig Start Date End Date Taking? Authorizing Provider  acetaminophen (TYLENOL) 500 MG tablet Take 500 mg by mouth every 6 (six) hours as needed for mild pain.    [provider]  ferrous sulfate 325 (65 FE) MG tablet Take 1 tablet (325 mg total) by mouth daily with breakfast. 06/08/19 07/08/19  Lurline Del, DO  metoprolol succinate (TOPROL-XL) 25 MG 24 hr tablet Take 25 mg by mouth at bedtime.  01/02/19   [provider]  ondansetron (ZOFRAN) 4 MG tablet Take by mouth. 03/22/19   [provider]  pantoprazole (PROTONIX) 40 MG tablet Take 1 tablet (40 mg total) by mouth 2 (two) times daily. For 3 months then decrease to once daily 08/14/19 11/12/19  Mansouraty, Telford Nab., MD    Family History Family History  Problem Relation Age of Onset  . Diabetes Mother   . Diabetes Father   . Cancer Father   . Colon cancer Neg Hx   . Esophageal cancer Neg Hx   . Rectal cancer Neg Hx   . Stomach cancer Neg Hx     Social History Social History   Tobacco Use  . Smoking status: Never Smoker  . Smokeless tobacco: Never Used  Substance Use Topics  . Alcohol use: Yes    Alcohol/week: 44.0 standard drinks    Types: 44 Shots of liquor per week    Comment: 3 shots of liquor in each of 4 drinks daily  . Drug use: Not Currently     Allergies   Benadryl  [diphenhydramine] and Vancomycin   Review of Systems Review of Systems  All other systems reviewed and are negative.    Physical Exam Updated Vital Signs BP 108/70   Pulse 88   Temp 98.6 F (37 C) (Oral)   Resp 15   Ht 6\' 2"  (1.88 m)   Wt 102.1 kg   SpO2 100%   BMI 28.89 kg/m   Physical Exam Vitals signs and nursing note reviewed.  Constitutional:      Appearance: He is well-developed.  HENT:     Head: Normocephalic and atraumatic.  Eyes:     Conjunctiva/sclera: Conjunctivae normal.  Neck:     Musculoskeletal: Neck supple.  Cardiovascular:     Rate and Rhythm: Normal rate and regular rhythm.     Heart sounds:  No murmur.  Pulmonary:     Effort: Pulmonary effort is normal. No respiratory distress.     Breath sounds: Normal breath sounds.  Abdominal:     Palpations: Abdomen is soft.     Tenderness: There is no abdominal tenderness.  Musculoskeletal:     Comments: Dialysis graft right arm with palpable thrill also catheter chest.  Bilateral buttocks slight erythema no skin breakdown sacral area shows a 12 x 4 cm red streak  Skin:    General: Skin is warm and dry.  Neurological:     Mental Status: He is alert.      ED Treatments / Results  Labs (all labs ordered are listed, but only abnormal results are displayed) Labs Reviewed  CBC WITH DIFFERENTIAL/PLATELET - Abnormal; Notable for the following components:      Result Value   RBC 2.92 (*)    Hemoglobin 9.7 (*)    HCT 29.4 (*)    MCV 100.7 (*)    RDW 18.0 (*)    Lymphs Abs 0.6 (*)    All other components within normal limits  COMPREHENSIVE METABOLIC PANEL - Abnormal; Notable for the following components:   Sodium 126 (*)    Chloride 88 (*)    CO2 16 (*)    Glucose, Bld 61 (*)    Creatinine, Ser 5.18 (*)    Calcium 8.4 (*)    Total Protein 5.0 (*)    Albumin 1.8 (*)    AST 68 (*)    Total Bilirubin 1.4 (*)    GFR calc non Af Amer 11 (*)    GFR calc Af Amer 13 (*)    Anion gap 22 (*)    All  other components within normal limits    EKG None  Radiology No results found.  Procedures Procedures (including critical care time)  Medications Ordered in ED Medications  HYDROcodone-acetaminophen (NORCO/VICODIN) 5-325 MG per tablet 2 tablet (2 tablets Oral Not Given 08/22/19 1149)  HYDROcodone-acetaminophen (NORCO/VICODIN) 5-325 MG per tablet 2 tablet (2 tablets Oral Given 08/22/19 1144)  fentaNYL (SUBLIMAZE) injection 50 mcg (50 mcg Intravenous Given 08/22/19 1418)     Initial Impression / Assessment and Plan / ED Course  I have reviewed the triage vital signs and the nursing notes.  Pertinent labs & imaging results that were available during my care of the patient were reviewed by me and considered in my medical decision making (see chart for details).        MDM   Pt given Hydrocodone for pain.  Wounds cleaned, Labs obtained and reviewed.   I spoke with Rosendo Gros RN care mangement who will set up home health for wound care.  [place] work advised pt has dialysis at 6:30 am tomorrow.    Final Clinical Impressions(s) / ED Diagnoses   Final diagnoses:  Acute renal failure, unspecified acute renal failure type [facility])    ED Discharge Orders    None    An After Visit Summary was printed and given to the patient.    Fransico Meadow, [place] 08/22/19 1438    Carmin Muskrat, MD 08/23/19 [phone]

## 2019-08-22 NOTE — Telephone Encounter (Signed)
Patients wife had just called and wanted to let us know that patient is at [facility] in the ED. She said she wasn't sure if it had anything to do with blood in his stool but he is very lethargic, had a high fever and blood pressure is high. She stated she just wanted to let someone know.

## 2019-08-22 NOTE — ED Triage Notes (Signed)
Per [facility] pt coming from home with c/o worsening bed sores. Patient states he is supposed to see the wound nurse but not sure when. Dialysis patient on M/W/F and states was not able to get treatment yesterday due to being soiled and would not take him. Patient soiled on arrival. CBG 77 states he has not been eating.

## 2019-08-22 NOTE — Discharge Planning (Signed)
Pt active at [facility] MD: Daneil Dolin SW: Abundio Miu  Pager: [phone] Desk [phone]

## 2019-08-22 NOTE — TOC Transition Note (Signed)
Transition of Care [facility]) - CM/SW Discharge Note   Patient Details  Name: Noah Vang MRN: [id number] Date of Birth: 09-11-1956  Transition of Care [facility]) CM/SW Contact:  Fuller Mandril, RN Phone Number: 08/22/2019, 2:15 PM   Clinical Narrative:    Jason Coop. Clydene Laming, RN, BSN, [employer] [phone] Spoke with pt at bedside regarding discharge planning for [facility]. Offered pt list of home health agencies to choose from.  Pt chose [facility] to render services. Joen Laura of [facility]  notified. Patient made aware that [facility]  will be in contact in 24-48 hours.  No DME needs identified at this time.    Final next level of care: [place] Barriers to Discharge: Barriers Resolved   Patient Goals and CMS Choice Patient states their goals for this hospitalization and ongoing recovery are:: heal my wounds CMS Medicare.gov Compare Post Acute Care list provided to:: Patient(VA patient- wanted [facility] agency covered by [facility]) Choice offered to / list presented to : Adult Children  Discharge Placement                       Discharge Plan and Services   Discharge Planning Services: CM Consult                      HH Arranged: RN [facility] Agency: [facility] (now [facility]) Date [place]: 08/22/19 Time [place]: 2023 Representative spoke with at [place]: [place] ([place]) Interventions     Readmission Risk Interventions No flowsheet data found.

## 2019-08-22 NOTE — Discharge Instructions (Signed)
Go to Dialysis tomorrow at 6:10 am. A home health nurse will be coming to treat wounds

## 2019-08-22 NOTE — ED Notes (Signed)
Called ptar for pt transport back to home.

## 2019-08-22 NOTE — Progress Notes (Signed)
Renal Navigator appreciates call from ED regarding patient's missed dialysis today. Navigator called [facility] who states patient missed his regularly scheduled HD on Monday, and therefore, he was rescheduled for today. He then missed his rescheduled appointment today due to being in the ED. He should go to his regularly scheduled appointment tomorrow, Wednesday, 08/23/19 at 6:30am, with 6:10am arrival time if he is stable for discharge from the ED today.  Renal Navigator contacted PA/K. Sofia to inform.  Alphonzo Cruise, [place] Renal Navigator [phone]

## 2019-08-22 NOTE — ED Notes (Signed)
Patient Alert and oriented to baseline. Stable and ambulatory to baseline. Patient verbalized understanding of the discharge instructions.  Patient belongings were taken by the patient.   

## 2019-08-22 NOTE — Discharge Planning (Signed)
EDCM spoke with Abundio Miu, SW regarding [place] agencies preferred by [place].

## 2019-08-23 ENCOUNTER — Encounter: Payer: Self-pay | Admitting: Gastroenterology

## 2019-08-23 ENCOUNTER — Telehealth: Payer: Self-pay

## 2019-08-23 NOTE — Telephone Encounter (Signed)
appt made with Dr Rush Landmark on 10/04/19 at 1:30 pm.  Letter sent with appt info to the pt home.

## 2019-08-23 NOTE — Telephone Encounter (Signed)
-----   Message from Irving Copas., MD sent at 08/23/2019  4:30 AM EDT ----- Regarding: Follow-up [name],Please schedule a follow-up with this patient in approximately 6 weeks with Janett Billow or myself.Thank you.GM

## 2019-08-29 ENCOUNTER — Other Ambulatory Visit: Payer: Self-pay

## 2019-08-29 DIAGNOSIS — Z992 Dependence on renal dialysis: Secondary | ICD-10-CM

## 2019-08-29 DIAGNOSIS — N186 End stage renal disease: Secondary | ICD-10-CM

## 2019-08-31 ENCOUNTER — Other Ambulatory Visit (HOSPITAL_COMMUNITY): Payer: Medicare Other

## 2019-08-31 ENCOUNTER — Encounter (HOSPITAL_COMMUNITY): Payer: Medicare Other

## 2019-08-31 ENCOUNTER — Ambulatory Visit: Payer: Medicare Other | Admitting: Vascular Surgery

## 2019-09-07 MED ORDER — LACTULOSE 10 GM/15ML PO SOLN
20.00 | ORAL | Status: DC
Start: 2019-09-07 — End: 2019-09-07

## 2019-09-07 MED ORDER — GENERIC EXTERNAL MEDICATION
3.38 | Status: DC
Start: 2019-09-07 — End: 2019-09-07

## 2019-09-07 MED ORDER — THIAMINE HCL 100 MG/ML IJ SOLN
100.00 | INTRAMUSCULAR | Status: DC
Start: 2019-09-08 — End: 2019-09-07

## 2019-09-07 MED ORDER — METOPROLOL TARTRATE 25 MG PO TABS
25.00 | ORAL_TABLET | ORAL | Status: DC
Start: 2019-09-07 — End: 2019-09-07

## 2019-09-07 MED ORDER — GENERIC EXTERNAL MEDICATION
0.00 | Status: DC
Start: ? — End: 2019-09-07

## 2019-09-07 MED ORDER — DEXTROSE 10 % IV SOLN
125.00 | INTRAVENOUS | Status: DC
Start: ? — End: 2019-09-07

## 2019-09-07 MED ORDER — GENERIC EXTERNAL MEDICATION
1.00 | Status: DC
Start: 2019-09-08 — End: 2019-09-07

## 2019-09-07 MED ORDER — MAGNESIUM OXIDE 400 MG PO TABS
400.00 | ORAL_TABLET | ORAL | Status: DC
Start: 2019-09-07 — End: 2019-09-07

## 2019-09-07 MED ORDER — INSULIN LISPRO 100 UNIT/ML ~~LOC~~ SOLN
2.00 | SUBCUTANEOUS | Status: DC
Start: 2019-09-07 — End: 2019-09-07

## 2019-09-07 MED ORDER — LINEZOLID 100 MG/5ML PO SUSR
600.00 | ORAL | Status: DC
Start: 2019-09-07 — End: 2019-09-07

## 2019-09-07 MED ORDER — ALTEPLASE 2 MG IJ SOLR
2.00 | INTRAMUSCULAR | Status: DC
Start: ? — End: 2019-09-07

## 2019-09-07 MED ORDER — HYDRALAZINE HCL 25 MG PO TABS
25.00 | ORAL_TABLET | ORAL | Status: DC
Start: ? — End: 2019-09-07

## 2019-09-07 MED ORDER — RIFAXIMIN 550 MG PO TABS
550.00 | ORAL_TABLET | ORAL | Status: DC
Start: 2019-09-07 — End: 2019-09-07

## 2019-09-07 MED ORDER — GENERIC EXTERNAL MEDICATION
Status: DC
Start: 2019-09-07 — End: 2019-09-07

## 2019-09-07 MED ORDER — GENERIC EXTERNAL MEDICATION
20.00 | Status: DC
Start: ? — End: 2019-09-07

## 2019-09-07 MED ORDER — GENERIC EXTERNAL MEDICATION
20.00 | Status: DC
Start: 2019-09-07 — End: 2019-09-07

## 2019-09-07 MED ORDER — GENERIC EXTERNAL MEDICATION
Status: DC
Start: ? — End: 2019-09-07

## 2019-09-07 MED ORDER — GENERIC EXTERNAL MEDICATION
100.00 | Status: DC
Start: 2019-09-08 — End: 2019-09-07

## 2019-09-07 MED ORDER — GLUCOSE 40 % PO GEL
15.00 | ORAL | Status: DC
Start: ? — End: 2019-09-07

## 2019-09-07 MED ORDER — FOLIC ACID 1 MG PO TABS
1.00 | ORAL_TABLET | ORAL | Status: DC
Start: 2019-09-08 — End: 2019-09-07

## 2019-10-01 DEATH — deceased

## 2019-10-04 ENCOUNTER — Ambulatory Visit: Payer: Medicare Other | Admitting: Gastroenterology
